# Patient Record
Sex: Female | Born: 1955
Health system: Southern US, Community
[De-identification: ages and names within clinical notes are randomized; demographics above are authoritative.]

## PROBLEM LIST (undated history)

## (undated) DIAGNOSIS — F419 Anxiety disorder, unspecified: Secondary | ICD-10-CM

## (undated) DIAGNOSIS — R51 Headache: Secondary | ICD-10-CM

## (undated) DIAGNOSIS — E039 Hypothyroidism, unspecified: Secondary | ICD-10-CM

## (undated) DIAGNOSIS — M1990 Unspecified osteoarthritis, unspecified site: Secondary | ICD-10-CM

## (undated) DIAGNOSIS — K754 Autoimmune hepatitis: Secondary | ICD-10-CM

## (undated) DIAGNOSIS — J45909 Unspecified asthma, uncomplicated: Secondary | ICD-10-CM

## (undated) DIAGNOSIS — J189 Pneumonia, unspecified organism: Secondary | ICD-10-CM

## (undated) HISTORY — PX: CARDIAC CATHETERIZATION: SHX172

## (undated) HISTORY — PX: ABDOMINAL HYSTERECTOMY: SHX81

## (undated) HISTORY — PX: KNEE ARTHROSCOPY W/ MENISCAL REPAIR: SHX1877

## (undated) HISTORY — PX: EYE SURGERY: SHX253

## (undated) HISTORY — PX: TUBAL LIGATION: SHX77

---

## 2001-06-14 ENCOUNTER — Encounter: Payer: Self-pay | Admitting: Internal Medicine

## 2001-06-14 ENCOUNTER — Ambulatory Visit (HOSPITAL_COMMUNITY): Admission: RE | Admit: 2001-06-14 | Discharge: 2001-06-14 | Payer: Self-pay | Admitting: Internal Medicine

## 2001-06-19 ENCOUNTER — Encounter: Payer: Self-pay | Admitting: Emergency Medicine

## 2001-06-19 ENCOUNTER — Emergency Department (HOSPITAL_COMMUNITY): Admission: EM | Admit: 2001-06-19 | Discharge: 2001-06-19 | Payer: Self-pay | Admitting: Emergency Medicine

## 2001-06-21 ENCOUNTER — Encounter: Payer: Self-pay | Admitting: Internal Medicine

## 2001-06-21 ENCOUNTER — Ambulatory Visit (HOSPITAL_COMMUNITY): Admission: RE | Admit: 2001-06-21 | Discharge: 2001-06-21 | Payer: Self-pay | Admitting: Internal Medicine

## 2002-03-19 ENCOUNTER — Ambulatory Visit (HOSPITAL_COMMUNITY): Admission: RE | Admit: 2002-03-19 | Discharge: 2002-03-19 | Payer: Self-pay | Admitting: Otolaryngology

## 2002-03-19 ENCOUNTER — Encounter: Payer: Self-pay | Admitting: Otolaryngology

## 2002-07-16 ENCOUNTER — Ambulatory Visit (HOSPITAL_COMMUNITY): Admission: RE | Admit: 2002-07-16 | Discharge: 2002-07-16 | Payer: Self-pay | Admitting: Family Medicine

## 2002-07-16 ENCOUNTER — Encounter: Payer: Self-pay | Admitting: Family Medicine

## 2003-01-09 ENCOUNTER — Ambulatory Visit (HOSPITAL_COMMUNITY): Admission: RE | Admit: 2003-01-09 | Discharge: 2003-01-09 | Payer: Self-pay | Admitting: Internal Medicine

## 2003-07-15 ENCOUNTER — Ambulatory Visit (HOSPITAL_COMMUNITY): Admission: RE | Admit: 2003-07-15 | Discharge: 2003-07-15 | Payer: Self-pay | Admitting: Internal Medicine

## 2003-07-21 ENCOUNTER — Ambulatory Visit (HOSPITAL_COMMUNITY): Admission: RE | Admit: 2003-07-21 | Discharge: 2003-07-21 | Payer: Self-pay | Admitting: Family Medicine

## 2003-10-23 ENCOUNTER — Encounter (HOSPITAL_COMMUNITY): Admission: RE | Admit: 2003-10-23 | Discharge: 2004-01-21 | Payer: Self-pay | Admitting: General Surgery

## 2003-12-07 ENCOUNTER — Ambulatory Visit (HOSPITAL_COMMUNITY): Admission: RE | Admit: 2003-12-07 | Discharge: 2003-12-07 | Payer: Self-pay | Admitting: Cardiovascular Disease

## 2003-12-10 ENCOUNTER — Ambulatory Visit (HOSPITAL_COMMUNITY): Admission: RE | Admit: 2003-12-10 | Discharge: 2003-12-10 | Payer: Self-pay | Admitting: Cardiovascular Disease

## 2004-03-11 ENCOUNTER — Encounter: Admission: RE | Admit: 2004-03-11 | Discharge: 2004-03-11 | Payer: Self-pay | Admitting: General Surgery

## 2004-08-26 ENCOUNTER — Ambulatory Visit (HOSPITAL_COMMUNITY): Admission: RE | Admit: 2004-08-26 | Discharge: 2004-08-26 | Payer: Self-pay | Admitting: Family Medicine

## 2004-09-01 ENCOUNTER — Ambulatory Visit (HOSPITAL_COMMUNITY): Admission: RE | Admit: 2004-09-01 | Discharge: 2004-09-01 | Payer: Self-pay | Admitting: Family Medicine

## 2005-05-09 ENCOUNTER — Ambulatory Visit (HOSPITAL_COMMUNITY): Admission: RE | Admit: 2005-05-09 | Discharge: 2005-05-09 | Payer: Self-pay | Admitting: Family Medicine

## 2006-01-04 ENCOUNTER — Encounter: Admission: RE | Admit: 2006-01-04 | Discharge: 2006-01-04 | Payer: Self-pay | Admitting: Family Medicine

## 2007-07-04 ENCOUNTER — Ambulatory Visit (HOSPITAL_COMMUNITY): Admission: RE | Admit: 2007-07-04 | Discharge: 2007-07-04 | Payer: Self-pay | Admitting: Internal Medicine

## 2008-06-24 ENCOUNTER — Ambulatory Visit (HOSPITAL_COMMUNITY): Admission: RE | Admit: 2008-06-24 | Discharge: 2008-06-24 | Payer: Self-pay | Admitting: Family Medicine

## 2009-06-10 ENCOUNTER — Emergency Department (HOSPITAL_COMMUNITY): Admission: EM | Admit: 2009-06-10 | Discharge: 2009-06-10 | Payer: Self-pay | Admitting: Emergency Medicine

## 2009-06-16 ENCOUNTER — Ambulatory Visit (HOSPITAL_COMMUNITY): Admission: RE | Admit: 2009-06-16 | Discharge: 2009-06-16 | Payer: Self-pay | Admitting: Family Medicine

## 2010-07-14 ENCOUNTER — Ambulatory Visit (HOSPITAL_COMMUNITY): Admission: RE | Admit: 2010-07-14 | Discharge: 2010-07-14 | Payer: Self-pay | Admitting: Family Medicine

## 2011-01-27 NOTE — Op Note (Signed)
NAME:  Kimberly Reyes, Kimberly Reyes                         ACCOUNT NO.:  [id number]   MEDICAL RECORD NO.:  [id number]                   PATIENT TYPE:  AMB   LOCATION:  DAY                                  FACILITY:  [facility]   PHYSICIAN:  Lionel December, M.D.                 DATE OF BIRTH:  23-Oct-1955   DATE OF PROCEDURE:  01/09/2003  DATE OF DISCHARGE:                                 OPERATIVE REPORT   PROCEDURE:  Total colonoscopy.   INDICATIONS FOR PROCEDURE:  Kimberly Reyes is a 55 year old Caucasian female who was  recently found to have heme positive stools. There is no history of melena  or rectal bleeding but she has chronic constipation and she has been able to  control it by watching her diet and taking a stool softener. Her family  history is positive for colon carcinoma in her father. The family history is  very significant for various malignancies on the father's side of the  family.   The procedure risks were viewed with the patient and informed consent was  obtained.   PREOPERATIVE MEDICATIONS:  Demerol 40 mg IV, Versed 6 mg IV in divided dose.   INSTRUMENT:  Olympus video system.   FINDINGS:  The procedure was performed in the endoscopy suite. The patient's  vital signs and O2 saturations were monitored during the procedure and  remained stable. The patient was placed in the left lateral decubitus  position, rectal examination performed. This was within normal limits. The  scope was placed per rectum and advanced under direct vision into the  sigmoid colon and beyond. Preparation was satisfactory.  The scope was passed in the cecum which was identified by the appendiceal  orifice and the ileocecal valve. Pictures taken for the record. As the scope  was withdrawn, the colonic mucosa was once again carefully examined and  there were no polyps, tumor mass or diverticular changes. The rectal mucosa  similarly was normal. The scope was retroflexed to examine the anorectal  junction which  was unremarkable.  The endoscope was straightened and  withdrawn. The patient tolerated the procedure well.   FINAL DIAGNOSIS:  Normal colonoscopy.   RECOMMENDATIONS:  High fiber diet, Citrucel or equivalent one tablespoonful  daily and Colace 1-2 tablets at bedtime.   I would like to have hemoccults repeated in three months and also get a CBC  check. If these are negative, she will not need further studies; however, if  stool is still guaiac positive may need upper GI with small bowel follow  through.                                                Lionel December, M.D.    NR/MEDQ  D:  01/09/2003  T:  01/09/2003  Job:  [id number]   cc:   Donata Duff  [address]  [place]  [place] [address]  Fax: [phone]   Corrie Mckusick, M.D.  [address] [place]  [place]  [place] [address]  Fax: [phone]

## 2011-01-27 NOTE — Cardiovascular Report (Signed)
NAME:  Kimberly Reyes, Kimberly Reyes                         ACCOUNT NO.:  [id number]   MEDICAL RECORD NO.:  [id number]                   PATIENT TYPE:  OIB   LOCATION:  2856                                 FACILITY:  [facility]   PHYSICIAN:  Nanetta Batty, M.D.                DATE OF BIRTH:  [birth year]   DATE OF PROCEDURE:  12/10/2003  DATE OF DISCHARGE:  12/10/2003                              CARDIAC CATHETERIZATION   INDICATIONS:  Kimberly Reyes is a 55 year old married white female who was  complaining of chest discomfort.  She has a minimally abnormal Cardiolite.  She is being evaluated for a breast mass.  She was referred by Dr. Domingo Sep  for diagnostic coronary arteriography to define her anatomy and risk  stratify her.   PROCEDURE DESCRIPTION:  The patient was brought to the second floor [facility] in the postabsorptive state.  She was  premedicated with IV Versed and morphine.  Her right groin was prepped and  shaved in the usual sterile fashion.  1% Xylocaine was used for local  anesthesia.  A 6 French sheath was inserted into the right femoral artery  using standard Seldinger technique. A 6 French right and left Judkins  diagnostic catheter as well as 6 French pigtail catheter were used for  selective coronary angiography, left ventriculography respectively.  Omnipaque dye was used for the entirety of the case. Retrograde aortic,  ventricular pullback pressures were recorded.   HEMODYNAMICS:  1. Aortic systolic pressure 140, diastolic pressure 85.  2. Left ventricular systolic pressure 130, end-diastolic pressure 10.   SELECTIVE CORONARY ANGIOGRAPHY:  1. Left main normal.  2. LAD:  Normal.  3. Left circumflex:  Normal.  4. Right coronary artery is dominant and normal.   LEFT VENTRICULOGRAPHY:  RAO left ventriculogram was performed using 25 mL of  Omnipaque dye at 12 mL per second.  The overall LVEF was estimated greater  than 60% with focal wall motion  abnormalities.   IMPRESSION:  Kimberly Reyes has essentially normal coronary arteries, normal  left ventricular function.  Her chest pain is noncardiac and her Cardiolite  was false-positive/artifactual.  The sheaths were removed.  Pressure was  held  on the groin to achieve hemostasis.  The patient left the lab in stable  condition.  She will be discharged home later today after remaining  recumbent for five hours and will see Dr. Delorise Jackson back in followup.  Dr. Dani Gobble, MD was notified of these results.  The patient left the lab in  stable condition.                                               Nanetta Batty, M.D.    Cordelia Pen  D:  12/10/2003  T:  12/11/2003  Job:  [id number]   cc:   [facility]  [address]  [place], [place] [address]   Dani Gobble, MD  Fax: [phone]   Corrie Mckusick, M.D.  [address] [place]  [place]  [place] [address]  Fax: [phone]

## 2012-10-10 ENCOUNTER — Encounter (INDEPENDENT_AMBULATORY_CARE_PROVIDER_SITE_OTHER): Payer: Self-pay | Admitting: *Deleted

## 2012-10-10 ENCOUNTER — Encounter (INDEPENDENT_AMBULATORY_CARE_PROVIDER_SITE_OTHER): Payer: Self-pay

## 2012-12-17 ENCOUNTER — Encounter (INDEPENDENT_AMBULATORY_CARE_PROVIDER_SITE_OTHER): Payer: Self-pay | Admitting: *Deleted

## 2013-02-11 ENCOUNTER — Other Ambulatory Visit (INDEPENDENT_AMBULATORY_CARE_PROVIDER_SITE_OTHER): Payer: Self-pay | Admitting: *Deleted

## 2013-02-11 ENCOUNTER — Encounter (INDEPENDENT_AMBULATORY_CARE_PROVIDER_SITE_OTHER): Payer: Self-pay | Admitting: *Deleted

## 2013-02-11 ENCOUNTER — Telehealth (INDEPENDENT_AMBULATORY_CARE_PROVIDER_SITE_OTHER): Payer: Self-pay | Admitting: *Deleted

## 2013-02-11 DIAGNOSIS — Z800 Family history of malignant neoplasm of digestive organs: Secondary | ICD-10-CM

## 2013-02-11 DIAGNOSIS — Z1211 Encounter for screening for malignant neoplasm of colon: Secondary | ICD-10-CM

## 2013-02-11 NOTE — Telephone Encounter (Signed)
Patient needs movi prep 

## 2013-02-12 MED ORDER — PEG-KCL-NACL-NASULF-NA ASC-C 100 G PO SOLR: 1.0000 | Freq: Once | ORAL | Status: DC

## 2013-02-25 ENCOUNTER — Telehealth (INDEPENDENT_AMBULATORY_CARE_PROVIDER_SITE_OTHER): Payer: Self-pay | Admitting: *Deleted

## 2013-02-25 NOTE — Telephone Encounter (Signed)
agree

## 2013-02-25 NOTE — Telephone Encounter (Signed)
  Procedure: tcs  Reason/Indication:  fam hx colon ca  Has patient had this procedure before?  2009 (scanned)  If so, when, by whom and where?    Is there a family history of colon cancer?  Yes, father & sister  Who?  What age when diagnosed?    Is patient diabetic?   no      Does patient have prosthetic heart valve?  no  Do you have a pacemaker?  no  Has patient ever had endocarditis? no  Has patient had joint replacement within last 12 months?  no  Is patient on Coumadin, Plavix and/or Aspirin? no  Medications: clobetasol propionate liquid prn, cyclobenzapr flexural 10 mg tid, wellbutrin 300 mg daily, levothyroxine 50 mg daily, premarin 6.25 mg daily, vitamins  Allergies: nkda  Medication Adjustment:   Procedure date & time: 03/27/13 at 1030

## 2013-03-12 ENCOUNTER — Encounter (HOSPITAL_COMMUNITY): Payer: Self-pay

## 2013-03-27 ENCOUNTER — Encounter (HOSPITAL_COMMUNITY): Payer: Self-pay

## 2013-03-27 ENCOUNTER — Encounter (HOSPITAL_COMMUNITY)
Admission: RE | Disposition: A | Discharge status: Home / Self Care | Payer: Self-pay | Source: Ambulatory Visit | Attending: Internal Medicine

## 2013-03-27 ENCOUNTER — Ambulatory Visit (HOSPITAL_COMMUNITY)
Admission: RE | Admit: 2013-03-27 | Discharge: 2013-03-27 | Disposition: A | Discharge status: Home / Self Care | Payer: 59 | Source: Ambulatory Visit | Attending: Internal Medicine | Admitting: Internal Medicine

## 2013-03-27 DIAGNOSIS — F411 Generalized anxiety disorder: Secondary | ICD-10-CM | POA: Insufficient documentation

## 2013-03-27 DIAGNOSIS — Z1211 Encounter for screening for malignant neoplasm of colon: Secondary | ICD-10-CM | POA: Insufficient documentation

## 2013-03-27 DIAGNOSIS — Z800 Family history of malignant neoplasm of digestive organs: Secondary | ICD-10-CM

## 2013-03-27 DIAGNOSIS — Z90710 Acquired absence of both cervix and uterus: Secondary | ICD-10-CM | POA: Insufficient documentation

## 2013-03-27 DIAGNOSIS — Z79899 Other long term (current) drug therapy: Secondary | ICD-10-CM | POA: Insufficient documentation

## 2013-03-27 DIAGNOSIS — E0590 Thyrotoxicosis, unspecified without thyrotoxic crisis or storm: Secondary | ICD-10-CM | POA: Insufficient documentation

## 2013-03-27 DIAGNOSIS — K5900 Constipation, unspecified: Secondary | ICD-10-CM | POA: Insufficient documentation

## 2013-03-27 HISTORY — DX: Headache: R51

## 2013-03-27 HISTORY — PX: COLONOSCOPY: SHX5424

## 2013-03-27 HISTORY — DX: Anxiety disorder, unspecified: F41.9

## 2013-03-27 SURGERY — COLONOSCOPY
Anesthesia: Moderate Sedation

## 2013-03-27 MED ORDER — SODIUM CHLORIDE 0.9 % IV SOLN
INTRAVENOUS | Status: DC
  Administered 2013-03-27: 10:00:00 via INTRAVENOUS

## 2013-03-27 MED ORDER — MIDAZOLAM HCL 5 MG/5ML IJ SOLN
INTRAMUSCULAR | Status: DC | PRN
  Administered 2013-03-27: 1 mg via INTRAVENOUS
  Administered 2013-03-27 (×3): 2 mg via INTRAVENOUS

## 2013-03-27 MED ORDER — MEPERIDINE HCL 50 MG/ML IJ SOLN
INTRAMUSCULAR | Status: DC | PRN
  Administered 2013-03-27 (×2): 25 mg via INTRAVENOUS

## 2013-03-27 MED ORDER — MIDAZOLAM HCL 5 MG/5ML IJ SOLN
INTRAMUSCULAR | Status: AC
  Filled 2013-03-27: qty 10

## 2013-03-27 MED ORDER — MEPERIDINE HCL 50 MG/ML IJ SOLN
INTRAMUSCULAR | Status: AC
  Filled 2013-03-27: qty 1

## 2013-03-27 MED ORDER — STERILE WATER FOR IRRIGATION IR SOLN
Status: DC | PRN
  Administered 2013-03-27: 11:00:00

## 2013-03-27 NOTE — Op Note (Signed)
COLONOSCOPY PROCEDURE REPORT  PATIENT:  Kimberly Reyes  MR#:  [id number] Birthdate:  Feb 26, 1956, 57 y.o., female Endoscopist:  Dr. Malissa Hippo, MD Referred By:  Dr. Corrie Mckusick, MD Procedure Date: 03/27/2013  Procedure:   Colonoscopy  Indications: Patient is a 57 year old Caucasian female who is undergoing high risk screening colonoscopy. Family history significant for colon carcinoma in her father at age 71. He lived to be 84.  Informed Consent:  The procedure and risks were reviewed with the patient and informed consent was obtained.  Medications:  Demerol 50 mg IV Versed 7 mg IV  Description of procedure:  After a digital rectal exam was performed, that colonoscope was advanced from the anus through the rectum and colon to the area of the cecum, ileocecal valve and appendiceal orifice. The cecum was deeply intubated. These structures were well-seen and photographed for the record. From the level of the cecum and ileocecal valve, the scope was slowly and cautiously withdrawn. The mucosal surfaces were carefully surveyed utilizing scope tip to flexion to facilitate fold flattening as needed. The scope was pulled down into the rectum where a thorough exam including retroflexion was performed. Terminal ileum was also examined.  Findings:   Prep satisfactory. Normal mucosa of terminal. Normal mucosa of the various segments of colon. Normal rectal mucosa and anal rectal junction.   Therapeutic/Diagnostic Maneuvers Performed:  None  Complications:  None  Cecal Withdrawal Time:  7 minutes  Impression:  Normal terminal ileum. Normal colonoscopy.  Recommendations:  Standard instructions given. Next screening exam in 5 years.  REHMAN,NAJEEB U  03/27/2013 11:18 AM  CC: Dr. Phillips Odor, Chancy Hurter, MD & Dr. Bonnetta Barry ref. provider found

## 2013-03-27 NOTE — H&P (Signed)
Kimberly Reyes is an 57 y.o. female.   Chief Complaint: Patient is here for colonoscopy. HPI: Patient is 57 year old Caucasian female who is here for screening colonoscopy. She denies abdominal pain or rectal bleeding. She has chronic constipation. Her last exam was in January 2009. Family history significant for CRC and father who was 1 at the time of diagnosis and died at 6 of unrelated causes.  Past Medical History  Diagnosis Date  . Hyperthyroidism   . Anxiety   . MVHQIONG(295.2)     Past Surgical History  Procedure Laterality Date  . Abdominal hysterectomy      20 years ago    Family History  Problem Relation Age of Onset  . Colon cancer Father    Social History:  reports that she has never smoked. She does not have any smokeless tobacco history on file. She reports that  drinks alcohol. She reports that she does not use illicit drugs.  Allergies: No Known Allergies  Medications Prior to Admission  Medication Sig Dispense Refill  . BIOTIN PO Take 1 tablet by mouth daily.      Marland Kitchen buPROPion (WELLBUTRIN XL) 300 MG 24 hr tablet Take 300 mg by mouth daily.      . Calcium Carbonate-Vit D-Min (CALCIUM 1200 PO) Take 1 tablet by mouth daily.      . CHROMIUM PICOLINATE PO Take 1 tablet by mouth daily.      . Cyanocobalamin (VITAMIN B 12 PO) Take 1 tablet by mouth daily.      . cyclobenzaprine (FLEXERIL) 10 MG tablet Take 10 mg by mouth 3 (three) times daily as needed for muscle spasms.      Marland Kitchen eletriptan (RELPAX) 40 MG tablet Take 40 mg by mouth as needed for migraine. One tablet by mouth at onset of headache. May repeat in 2 hours if headache persists or recurs.      Marland Kitchen estrogens, conjugated, (PREMARIN) 0.625 MG tablet Take 0.625 mg by mouth daily. Take daily for 21 days then do not take for 7 days.      Marland Kitchen levothyroxine (SYNTHROID, LEVOTHROID) 50 MCG tablet Take 50 mcg by mouth daily before breakfast.      . MAGNESIUM PO Take 1 tablet by mouth daily.      . Multiple  Vitamins-Minerals (MULTIVITAMINS THER. W/MINERALS) TABS Take 1 tablet by mouth daily.      . Omega-3 Fatty Acids (FISH OIL) 1200 MG CAPS Take 1 capsule by mouth daily.      . peg 3350 powder (MOVIPREP) 100 G SOLR Take 1 kit (100 g total) by mouth once.  1 kit  0    No results found for this or any previous visit (from the past 48 hour(s)). No results found.  ROS  Blood pressure 140/82, pulse 93, temperature 97.5 F (36.4 C), temperature source Oral, resp. rate 22, height 5' 8.5" (1.74 m), weight 158 lb (71.668 kg), SpO2 100.00%. Physical Exam  Constitutional: She appears well-developed and well-nourished.  HENT:  Mouth/Throat: Oropharynx is clear and moist.  Eyes: Conjunctivae are normal.  Neck: Neck supple. No thyromegaly present.  Cardiovascular: Normal rate, regular rhythm and normal heart sounds.   No murmur heard. Respiratory: Effort normal and breath sounds normal.  GI: Soft. She exhibits no distension and no mass. There is no tenderness.  Musculoskeletal: She exhibits no edema.  Lymphadenopathy:    She has no cervical adenopathy.  Neurological: She is alert.  Skin: Skin is warm and dry.  Assessment/Plan High risk screening colonoscopy.  REHMAN,NAJEEB U 03/27/2013, 10:47 AM

## 2013-03-28 ENCOUNTER — Encounter (HOSPITAL_COMMUNITY): Payer: Self-pay | Admitting: Internal Medicine

## 2013-06-10 ENCOUNTER — Ambulatory Visit (HOSPITAL_COMMUNITY): Payer: 59 | Admitting: Physical Therapy

## 2014-10-19 ENCOUNTER — Other Ambulatory Visit (HOSPITAL_COMMUNITY): Payer: Self-pay | Admitting: Family Medicine

## 2014-10-19 DIAGNOSIS — K5909 Other constipation: Secondary | ICD-10-CM

## 2014-10-19 DIAGNOSIS — R109 Unspecified abdominal pain: Secondary | ICD-10-CM

## 2014-10-23 ENCOUNTER — Ambulatory Visit (HOSPITAL_COMMUNITY)
Admission: RE | Admit: 2014-10-23 | Discharge: 2014-10-23 | Disposition: A | Discharge status: Home / Self Care | Payer: 59 | Source: Ambulatory Visit | Attending: Family Medicine | Admitting: Family Medicine

## 2014-10-23 ENCOUNTER — Emergency Department (HOSPITAL_COMMUNITY)
Admission: EM | Admit: 2014-10-23 | Discharge: 2014-10-24 | Disposition: A | Discharge status: Home / Self Care | Payer: 59 | Attending: Emergency Medicine | Admitting: Emergency Medicine

## 2014-10-23 ENCOUNTER — Encounter (HOSPITAL_COMMUNITY): Payer: Self-pay | Admitting: Emergency Medicine

## 2014-10-23 DIAGNOSIS — K8020 Calculus of gallbladder without cholecystitis without obstruction: Secondary | ICD-10-CM | POA: Diagnosis not present

## 2014-10-23 DIAGNOSIS — R109 Unspecified abdominal pain: Secondary | ICD-10-CM

## 2014-10-23 DIAGNOSIS — E039 Hypothyroidism, unspecified: Secondary | ICD-10-CM | POA: Diagnosis not present

## 2014-10-23 DIAGNOSIS — K8050 Calculus of bile duct without cholangitis or cholecystitis without obstruction: Secondary | ICD-10-CM

## 2014-10-23 DIAGNOSIS — Z9889 Other specified postprocedural states: Secondary | ICD-10-CM | POA: Diagnosis not present

## 2014-10-23 DIAGNOSIS — Z90710 Acquired absence of both cervix and uterus: Secondary | ICD-10-CM | POA: Insufficient documentation

## 2014-10-23 DIAGNOSIS — Z8659 Personal history of other mental and behavioral disorders: Secondary | ICD-10-CM | POA: Insufficient documentation

## 2014-10-23 DIAGNOSIS — R1110 Vomiting, unspecified: Secondary | ICD-10-CM | POA: Diagnosis present

## 2014-10-23 DIAGNOSIS — Z7982 Long term (current) use of aspirin: Secondary | ICD-10-CM | POA: Diagnosis not present

## 2014-10-23 DIAGNOSIS — K5909 Other constipation: Secondary | ICD-10-CM

## 2014-10-23 LAB — CBC WITH DIFFERENTIAL/PLATELET
BASOS ABS: 0 10*3/uL (ref 0.0–0.1)
BASOS PCT: 0 % (ref 0–1)
Eosinophils Absolute: 0.2 10*3/uL (ref 0.0–0.7)
Eosinophils Relative: 2 % (ref 0–5)
HCT: 38.5 % (ref 36.0–46.0)
Hemoglobin: 12.7 g/dL (ref 12.0–15.0)
LYMPHS PCT: 5 % — AB (ref 12–46)
Lymphs Abs: 0.8 10*3/uL (ref 0.7–4.0)
MCH: 30.6 pg (ref 26.0–34.0)
MCHC: 33 g/dL (ref 30.0–36.0)
MCV: 92.8 fL (ref 78.0–100.0)
MONO ABS: 1 10*3/uL (ref 0.1–1.0)
Monocytes Relative: 6 % (ref 3–12)
NEUTROS ABS: 13.8 10*3/uL — AB (ref 1.7–7.7)
Neutrophils Relative %: 87 % — ABNORMAL HIGH (ref 43–77)
PLATELETS: 246 10*3/uL (ref 150–400)
RBC: 4.15 MIL/uL (ref 3.87–5.11)
RDW: 12.3 % (ref 11.5–15.5)
WBC: 15.9 10*3/uL — AB (ref 4.0–10.5)

## 2014-10-23 MED ORDER — SODIUM CHLORIDE 0.9 % IV SOLN
1000.0000 mL | INTRAVENOUS | Status: DC
Start: 2014-10-23 — End: 2014-10-24

## 2014-10-23 MED ORDER — SODIUM CHLORIDE 0.9 % IV SOLN
1000.0000 mL | Freq: Once | INTRAVENOUS | Status: AC
  Administered 2014-10-24: 1000 mL via INTRAVENOUS

## 2014-10-23 MED ORDER — SODIUM CHLORIDE 0.9 % IV SOLN
1000.0000 mL | Freq: Once | INTRAVENOUS | Status: AC
  Administered 2014-10-23: 1000 mL via INTRAVENOUS

## 2014-10-23 MED ORDER — FENTANYL CITRATE 0.05 MG/ML IJ SOLN
50.0000 ug | Freq: Once | INTRAMUSCULAR | Status: AC
  Administered 2014-10-23: 50 ug via INTRAVENOUS
  Filled 2014-10-23: qty 2

## 2014-10-23 MED ORDER — ONDANSETRON HCL 4 MG/2ML IJ SOLN
4.0000 mg | Freq: Once | INTRAMUSCULAR | Status: AC
  Administered 2014-10-23: 4 mg via INTRAVENOUS
  Filled 2014-10-23: qty 2

## 2014-10-23 NOTE — ED Notes (Signed)
Onset tonight vomiting and abdominal pain

## 2014-10-23 NOTE — ED Provider Notes (Signed)
CSN: [id number]     Arrival date & time 10/23/14  2234 History   This chart was scribed for Janice Norrie, MD by Randa Evens, ED Scribe. This patient was seen in room [facility] and the patient's care was started at 11:12 PM.     Chief Complaint  Patient presents with  . Emesis   Patient is a 59 y.o. female presenting with vomiting. The history is provided by the patient. No language interpreter was used.  Emesis Associated symptoms: abdominal pain   Associated symptoms: no diarrhea    HPI Comments: Kimberly Reyes is a 59 y.o. female who presents to the Emergency Department complaining of vomiting x7 onset today about 3 hours PTA. Pt states she has associated nausea, left upper quadrant and epigastric abdominal pain and ?  fever. Pt states that her pain began right after eating [place] stew and grilled cheese sandwich tonight.  Pt states that she had an ultra sound today due to having problems with abdominal pain and diarrhea for the past 3 weeks. Pt states that she was told that she would have to have surgery after getting the reading of her ultra sound today which showed gallstones. She is waiting for a referral by her PCP. Pt denies any diarrhea today, but had diarrhea last week.   PCP Dr Hilma Favors   Past Medical History  Diagnosis Date  . Hyperthyroidism   . Anxiety   . PJASNKNL(976.7)    Past Surgical History  Procedure Laterality Date  . Abdominal hysterectomy      20 years ago  . Colonoscopy N/A 03/27/2013    Procedure: COLONOSCOPY;  Surgeon: Rogene Houston, MD;  Location: [facility];  Service: Endoscopy;  Laterality: N/A;  1030   Family History  Problem Relation Age of Onset  . Colon cancer Father    History  Substance Use Topics  . Smoking status: Never Smoker   . Smokeless tobacco: Not on file  . Alcohol Use: Yes   Employed Lives with spouse  OB History    No data available     Review of Systems  Constitutional: Positive for fever.   Gastrointestinal: Positive for nausea, vomiting and abdominal pain. Negative for diarrhea.  All other systems reviewed and are negative.    Allergies  Review of patient's allergies indicates no known allergies.  Home Medications   Prior to Admission medications   Medication Sig Start Date End Date Taking? Authorizing Provider  aspirin EC 81 MG tablet Take 81 mg by mouth daily.   Yes Historical Provider, MD  BIOTIN PO Take 1 tablet by mouth daily.   Yes Historical Provider, MD  Calcium Carbonate-Vit D-Min (CALCIUM 1200 PO) Take 1 tablet by mouth daily.   Yes Historical Provider, MD  CHROMIUM PICOLINATE PO Take 1 tablet by mouth daily.   Yes Historical Provider, MD  Cyanocobalamin (VITAMIN B 12 PO) Take 1 tablet by mouth daily.   Yes Historical Provider, MD  cyclobenzaprine (FLEXERIL) 10 MG tablet Take 10 mg by mouth 3 (three) times daily as needed for muscle spasms.   Yes Historical Provider, MD  eletriptan (RELPAX) 40 MG tablet Take 40 mg by mouth as needed for migraine. One tablet by mouth at onset of headache. May repeat in 2 hours if headache persists or recurs.   Yes Historical Provider, MD  levothyroxine (SYNTHROID, LEVOTHROID) 50 MCG tablet Take 50 mcg by mouth daily before breakfast.   Yes Historical Provider, MD  MAGNESIUM PO Take 1 tablet  by mouth daily.   Yes Historical Provider, MD  Multiple Vitamins-Minerals (MULTIVITAMINS THER. W/MINERALS) TABS Take 1 tablet by mouth daily.   Yes Historical Provider, MD  Omega-3 Fatty Acids (FISH OIL) 1200 MG CAPS Take 1 capsule by mouth daily.   Yes Historical Provider, MD   BP 153/85 mmHg  Pulse 112  Temp(Src) 98.5 F (36.9 C) (Oral)  Resp 20  Ht 5\' 9"  (1.753 m)  Wt 160 lb (72.576 kg)  BMI 23.62 kg/m2  SpO2 100%  Vital signs normal except tachycardia    Physical Exam  Constitutional: She is oriented to person, place, and time. She appears well-developed and well-nourished. She has a sickly appearance. She does not appear ill.  She appears distressed.  HENT:  Head: Normocephalic and atraumatic.  Right Ear: External ear normal.  Left Ear: External ear normal.  Nose: Nose normal. No mucosal edema or rhinorrhea.  Mouth/Throat: Mucous membranes are normal. No dental abscesses or uvula swelling.  Dry mucous membranes.   Eyes: Conjunctivae and EOM are normal. Pupils are equal, round, and reactive to light.  Neck: Normal range of motion and full passive range of motion without pain. Neck supple.  Cardiovascular: Normal rate, regular rhythm and normal heart sounds.  Exam reveals no gallop and no friction rub.   No murmur heard. Pulmonary/Chest: Effort normal and breath sounds normal. No respiratory distress. She has no wheezes. She has no rhonchi. She has no rales. She exhibits no tenderness and no crepitus.  Abdominal: Soft. Normal appearance and bowel sounds are normal. She exhibits no distension. There is tenderness in the epigastric area and left upper quadrant. There is no rebound and no guarding.  Musculoskeletal: Normal range of motion. She exhibits no edema or tenderness.  Moves all extremities well.   Neurological: She is alert and oriented to person, place, and time. She has normal strength. No cranial nerve deficit.  Skin: Skin is warm, dry and intact. No rash noted. No erythema. No pallor.  Psychiatric: She has a normal mood and affect. Her speech is normal and behavior is normal. Her mood appears not anxious.  Nursing note and vitals reviewed.   ED Course  Procedures (including critical care time)  Medications  0.9 %  sodium chloride infusion (0 mLs Intravenous Stopped 10/24/14 0006)    Followed by  0.9 %  sodium chloride infusion (1,000 mLs Intravenous New Bag/Given 10/24/14 0006)    Followed by  0.9 %  sodium chloride infusion (not administered)  ondansetron (ZOFRAN) injection 4 mg (4 mg Intravenous Given 10/23/14 2334)  fentaNYL (SUBLIMAZE) injection 50 mcg (50 mcg Intravenous Given 10/23/14 2337)   fentaNYL (SUBLIMAZE) injection 25 mcg (25 mcg Intravenous Given 10/24/14 0105)    DIAGNOSTIC STUDIES: Oxygen Saturation is 100% on RA, normal by my interpretation.    COORDINATION OF CARE: 11:17 PM-Discussed treatment plan with pt at bedside and pt agreed to plan.   01:45 pt given her test results. Still having pain, now about a "3". Nausea however is gone. Has need to have urine output, tongue still slightly dry. Finishing her 2nd liter of IV fluids.  Discussed more pain medications and if pain resolved will be able to be discharged.  01:55 patient states she only has brief episodes of discomfort that come and go. Feeling better. Eating ice chips, feels ready to go home.   Labs Review  Results for orders placed or performed during the hospital encounter of 10/23/14  CBC WITH DIFFERENTIAL  Result Value Ref Range  WBC 15.9 (H) 4.0 - 10.5 K/uL   RBC 4.15 3.87 - 5.11 MIL/uL   Hemoglobin 12.7 12.0 - 15.0 g/dL   HCT 38.5 36.0 - 46.0 %   MCV 92.8 78.0 - 100.0 fL   MCH 30.6 26.0 - 34.0 pg   MCHC 33.0 30.0 - 36.0 g/dL   RDW 12.3 11.5 - 15.5 %   Platelets 246 150 - 400 K/uL   Neutrophils Relative % 87 (H) 43 - 77 %   Neutro Abs 13.8 (H) 1.7 - 7.7 K/uL   Lymphocytes Relative 5 (L) 12 - 46 %   Lymphs Abs 0.8 0.7 - 4.0 K/uL   Monocytes Relative 6 3 - 12 %   Monocytes Absolute 1.0 0.1 - 1.0 K/uL   Eosinophils Relative 2 0 - 5 %   Eosinophils Absolute 0.2 0.0 - 0.7 K/uL   Basophils Relative 0 0 - 1 %   Basophils Absolute 0.0 0.0 - 0.1 K/uL  Comprehensive metabolic panel  Result Value Ref Range   Sodium 136 135 - 145 mmol/L   Potassium 3.8 3.5 - 5.1 mmol/L   Chloride 104 96 - 112 mmol/L   CO2 26 19 - 32 mmol/L   Glucose, Bld 128 (H) 70 - 99 mg/dL   BUN 17 6 - 23 mg/dL   Creatinine, Ser 0.89 0.50 - 1.10 mg/dL   Calcium 8.9 8.4 - 10.5 mg/dL   Total Protein 7.2 6.0 - 8.3 g/dL   Albumin 4.2 3.5 - 5.2 g/dL   AST 39 (H) 0 - 37 U/L   ALT 31 0 - 35 U/L   Alkaline Phosphatase 97 39 - 117  U/L   Total Bilirubin 0.5 0.3 - 1.2 mg/dL   GFR calc non Af Amer 69 (L) >90 mL/min   GFR calc Af Amer 80 (L) >90 mL/min   Anion gap 6 5 - 15  Urinalysis with microscopic  Result Value Ref Range   Color, Urine YELLOW YELLOW   APPearance CLEAR CLEAR   Specific Gravity, Urine <1.005 (L) 1.005 - 1.030   pH 6.0 5.0 - 8.0   Glucose, UA NEGATIVE NEGATIVE mg/dL   Hgb urine dipstick NEGATIVE NEGATIVE   Bilirubin Urine NEGATIVE NEGATIVE   Ketones, ur TRACE (A) NEGATIVE mg/dL   Protein, ur NEGATIVE NEGATIVE mg/dL   Urobilinogen, UA 0.2 0.0 - 1.0 mg/dL   Nitrite NEGATIVE NEGATIVE   Leukocytes, UA NEGATIVE NEGATIVE  Lipase, blood  Result Value Ref Range   Lipase 27 11 - 59 U/L      Imaging Review US Abdomen Complete  10/23/2014   CLINICAL DATA:  Abdominal cramping.  EXAM: ULTRASOUND ABDOMEN COMPLETE  COMPARISON:  07/14/2010.  FINDINGS: Gallbladder: Gallstones. Gallbladder wall thickness 1.8 mm. Negative Murphy sign.  Common bile duct: Diameter: 5.7 mm  Liver: No focal lesion identified. Within normal limits in parenchymal echogenicity.  IVC: No abnormality visualized.  Pancreas: Visualized portion unremarkable.  Spleen: Size and appearance within normal limits.  Right Kidney: Length: 10.1 cm. Echogenicity within normal limits. No mass or hydronephrosis visualized.  Left Kidney: Length: 11.1 cm. Echogenicity within normal limits. No mass or hydronephrosis visualized.  Abdominal aorta: No aneurysm visualized.  Other findings: None.  IMPRESSION: Gallstones.  Exam is otherwise unremarkable.   Electronically Signed   By: Marcello Moores  Register   On: 10/23/2014 10:04     EKG Interpretation None      MDM   Final diagnoses:  Gallstones  Biliary colic   New Prescriptions   HYDROCODONE-ACETAMINOPHEN (  NORCO) 5-325 MG PER TABLET    Take 1-2 tablets by mouth every 6 (six) hours as needed for moderate pain or severe pain.   ONDANSETRON (ZOFRAN ODT) 4 MG DISINTEGRATING TABLET    Take 1 tablet (4 mg total)  by mouth every 8 (eight) hours as needed for nausea or vomiting.    Plan discharge  Rolland Porter, MD, FACEP    I personally performed the services described in this documentation, which was scribed in my presence. The recorded information has been reviewed and considered.  Rolland Porter, MD, Abram Sander      Janice Norrie, MD 10/24/14 [phone]

## 2014-10-24 LAB — COMPREHENSIVE METABOLIC PANEL
ALK PHOS: 97 U/L (ref 39–117)
ALT: 31 U/L (ref 0–35)
ANION GAP: 6 (ref 5–15)
AST: 39 U/L — AB (ref 0–37)
Albumin: 4.2 g/dL (ref 3.5–5.2)
BUN: 17 mg/dL (ref 6–23)
CHLORIDE: 104 mmol/L (ref 96–112)
CO2: 26 mmol/L (ref 19–32)
Calcium: 8.9 mg/dL (ref 8.4–10.5)
Creatinine, Ser: 0.89 mg/dL (ref 0.50–1.10)
GFR calc Af Amer: 80 mL/min — ABNORMAL LOW (ref >90)
GFR, EST NON AFRICAN AMERICAN: 69 mL/min — AB (ref >90)
GLUCOSE: 128 mg/dL — AB (ref 70–99)
Potassium: 3.8 mmol/L (ref 3.5–5.1)
Sodium: 136 mmol/L (ref 135–145)
Total Bilirubin: 0.5 mg/dL (ref 0.3–1.2)
Total Protein: 7.2 g/dL (ref 6.0–8.3)

## 2014-10-24 LAB — URINALYSIS, ROUTINE W REFLEX MICROSCOPIC
Bilirubin Urine: NEGATIVE
Glucose, UA: NEGATIVE mg/dL
Hgb urine dipstick: NEGATIVE
LEUKOCYTES UA: NEGATIVE
NITRITE: NEGATIVE
PH: 6 (ref 5.0–8.0)
PROTEIN: NEGATIVE mg/dL
Specific Gravity, Urine: <1.005 — ABNORMAL LOW (ref 1.005–1.030)
UROBILINOGEN UA: 0.2 mg/dL (ref 0.0–1.0)

## 2014-10-24 LAB — LIPASE, BLOOD: Lipase: 27 U/L (ref 11–59)

## 2014-10-24 MED ORDER — FENTANYL CITRATE 0.05 MG/ML IJ SOLN
25.0000 ug | Freq: Once | INTRAMUSCULAR | Status: AC
  Administered 2014-10-24: 25 ug via INTRAVENOUS
  Filled 2014-10-24: qty 2

## 2014-10-24 MED ORDER — ONDANSETRON 4 MG PO TBDP: 4.0000 mg | ORAL_TABLET | Freq: Three times a day (TID) | ORAL | Status: DC | PRN

## 2014-10-24 MED ORDER — HYDROCODONE-ACETAMINOPHEN 5-325 MG PO TABS: 1.0000 | ORAL_TABLET | Freq: Four times a day (QID) | ORAL | Status: DC | PRN

## 2014-10-24 NOTE — Discharge Instructions (Signed)
Avoid fried, spicy or greasy foods. If you get pain or nausea again use the zofran and hydrocodone. Return to the ED if you get a fever, or have uncontrolled vomiting or pain. Follow up with a surgeon to discuss having your gall bladder removed.      Biliary Colic  Biliary colic is a steady or irregular pain in the upper abdomen. It is usually under the right side of the rib cage. It happens when gallstones interfere with the normal flow of bile from the gallbladder. Bile is a liquid that helps to digest fats. Bile is made in the liver and stored in the gallbladder. When you eat a meal, bile passes from the gallbladder through the cystic duct and the common bile duct into the small intestine. There, it mixes with partially digested food. If a gallstone blocks either of these ducts, the normal flow of bile is blocked. The muscle cells in the bile duct contract forcefully to try to move the stone. This causes the pain of biliary colic.  SYMPTOMS   A person with biliary colic usually complains of pain in the upper abdomen. This pain can be:  In the center of the upper abdomen just below the breastbone.  In the upper-right part of the abdomen, near the gallbladder and liver.  Spread back toward the right shoulder blade.  Nausea and vomiting.  The pain usually occurs after eating.  Biliary colic is usually triggered by the digestive system's demand for bile. The demand for bile is high after fatty meals. Symptoms can also occur when a person who has been fasting suddenly eats a very large meal. Most episodes of biliary colic pass after 1 to 5 hours. After the most intense pain passes, your abdomen may continue to ache mildly for about 24 hours. DIAGNOSIS  After you describe your symptoms, your caregiver will perform a physical exam. He or she will pay attention to the upper right portion of your belly (abdomen). This is the area of your liver and gallbladder. An ultrasound will help your caregiver  look for gallstones. Specialized scans of the gallbladder may also be done. Blood tests may be done, especially if you have fever or if your pain persists. PREVENTION  Biliary colic can be prevented by controlling the risk factors for gallstones. Some of these risk factors, such as heredity, increasing age, and pregnancy are a normal part of life. Obesity and a high-fat diet are risk factors you can change through a healthy lifestyle. Women going through menopause who take hormone replacement therapy (estrogen) are also more likely to develop biliary colic. TREATMENT   Pain medication may be prescribed.  You may be encouraged to eat a fat-free diet.  If the first episode of biliary colic is severe, or episodes of colic keep retuning, surgery to remove the gallbladder (cholecystectomy) is usually recommended. This procedure can be done through small incisions using an instrument called a laparoscope. The procedure often requires a brief stay in the hospital. Some people can leave the hospital the same day. It is the most widely used treatment in people troubled by painful gallstones. It is effective and safe, with no complications in more than 90% of cases.  If surgery cannot be done, medication that dissolves gallstones may be used. This medication is expensive and can take months or years to work. Only small stones will dissolve.  Rarely, medication to dissolve gallstones is combined with a procedure called shock-wave lithotripsy. This procedure uses carefully aimed shock waves  to break up gallstones. In many people treated with this procedure, gallstones form again within a few years. PROGNOSIS  If gallstones block your cystic duct or common bile duct, you are at risk for repeated episodes of biliary colic. There is also a 25% chance that you will develop a gallbladder infection(acute cholecystitis), or some other complication of gallstones within 10 to 20 years. If you have surgery, schedule it at  a time that is convenient for you and at a time when you are not sick. HOME CARE INSTRUCTIONS   Drink plenty of clear fluids.  Avoid fatty, greasy or fried foods, or any foods that make your pain worse.  Take medications as directed. SEEK MEDICAL CARE IF:   You develop a fever over 100.5 F (38.1 C).  Your pain gets worse over time.  You develop nausea that prevents you from eating and drinking.  You develop vomiting. SEEK IMMEDIATE MEDICAL CARE IF:   You have continuous or severe belly (abdominal) pain which is not relieved with medications.  You develop nausea and vomiting which is not relieved with medications.  You have symptoms of biliary colic and you suddenly develop a fever and shaking chills. This may signal cholecystitis. Call your caregiver immediately.  You develop a yellow color to your skin or the white part of your eyes (jaundice). Document Released: 01/29/2006 Document Revised: 11/20/2011 Document Reviewed: 04/09/2008 [facility] Patient Information 2015 [place], [place]. This information is not intended to replace advice given to you by your health care provider. Make sure you discuss any questions you have with your health care provider.

## 2014-10-29 ENCOUNTER — Ambulatory Visit (INDEPENDENT_AMBULATORY_CARE_PROVIDER_SITE_OTHER): Payer: Self-pay | Admitting: General Surgery

## 2014-10-29 NOTE — H&P (Signed)
Kimberly Reyes 10/29/2014 11:33 AM Location: [facility] Patient #: [id number] DOB: 04-Apr-1956 Married / Language: English / Race: White Female  History of Present Illness Randall Hiss M. Ariyan Brisendine MD; 10/29/2014 12:37 PM) Patient words: gallbladder.  The patient is a 59 year old female who presents for evaluation of gall stones. She is referred by Dr. [name]. She states that she has been having some intermittent abdominal pain mainly on the right side radiating to her back and shoulder for about 6 weeks now. However her episodes are becoming more frequent. They normally lasts for about 1 hour and are associated with nausea. However this past Friday she went for her scheduled ultrasound of her abdomen which demonstrated gallstones. She had soup and grilled cheese for lunch and then developed abdominal pain associated with multiple episodes of nausea and vomiting. This prompted her to go to the emergency room in [place]. She had a mild white count. She was given IV fluids and sent home. Since then she has been eating a bland diet but has ongoing nausea with oral intake. But she denies emesis. She has had some mild attacks but nothing severe like this past Friday. She denies fever or chills. She does have alternating bouts of diarrhea and constipation. She stated the pain felt like labor pain. Her stools have been a little bit light-colored. She denies any instead use. Her only prior abdominal surgery has been a hysterectomy.   Other Problems [name], CMA; 10/29/2014 11:34 AM) Cholelithiasis Migraine Headache Thyroid Disease  Past Surgical History [name], CMA; 10/29/2014 11:34 AM) Colon Polyp Removal - Colonoscopy Hysterectomy (not due to cancer) - Complete  Diagnostic Studies History [name], CMA; 10/29/2014 11:34 AM) Colonoscopy 1-5 years ago Mammogram within last year Pap Smear 1-5 years ago  Allergies [name], CMA; 10/29/2014 11:34 AM) No  Known Drug Allergies02/18/2016  Medication History [name], CMA; 10/29/2014 11:38 AM) Biotin (10MG  Capsule, Oral) Active. Calcium Carbonate (1250MG /5ML Suspension, Oral) Active. Flexeril (10MG  Tablet, Oral) Active. Relpax (40MG  Tablet, Oral) Active. Levothyroxine Sodium (50MCG Tablet, Oral) Active. Magnesium-Potassium (70-70MG  Capsule, Oral) Active. Multi Vitamin Daily (Oral) Active. Fish Oil Burp-Less (1200MG  Capsule, Oral) Active. Hydrocodone-Acetaminophen (5-325MG  Tablet, Oral) Active. Zofran ODT (4MG  Tablet Disperse, Oral) Active.  Social History [name], [place]; 10/29/2014 11:34 AM) Alcohol use Occasional alcohol use. No caffeine use No drug use Tobacco use Former smoker.  Family History [name], [place]; 10/29/2014 11:34 AM) Alcohol Abuse Mother. Bleeding disorder Mother, Sister. Colon Cancer Father. Colon Polyps Father. Heart Disease Father. Heart disease in female family member before age 14 Ischemic Bowel Disease Father, Sister. Migraine Headache Mother. Thyroid problems Sister.  Pregnancy / Birth History [name], CMA; 10/29/2014 11:34 AM) Age at menarche 37 years. Age of menopause <43 Contraceptive History Oral contraceptives. Gravida 1 Irregular periods Maternal age 70-30 Para 1  Review of Systems [name] CMA; 10/29/2014 11:34 AM) General Present- Appetite Loss. Not Present- Chills, Fatigue, Fever, Night Sweats, Weight Gain and Weight Loss. Skin Not Present- Change in Wart/Mole, Dryness, Hives, Jaundice, New Lesions, Non-Healing Wounds, Rash and Ulcer. HEENT Not Present- Earache, Hearing Loss, Hoarseness, Nose Bleed, Oral Ulcers, Ringing in the Ears, Seasonal Allergies, Sinus Pain, Sore Throat, Visual Disturbances, Wears glasses/contact lenses and Yellow Eyes. Respiratory Not Present- Bloody sputum, Chronic Cough, Difficulty Breathing, Snoring and Wheezing. Breast Not Present- Breast Mass, Breast Pain, Nipple  Discharge and Skin Changes. Cardiovascular Present- Leg Cramps. Not Present- Chest Pain, Difficulty Breathing Lying Down, Palpitations, Rapid Heart Rate, Shortness of Breath  and Swelling of Extremities. Gastrointestinal Present- Abdominal Pain, Constipation, Nausea and Vomiting. Not Present- Bloating, Bloody Stool, Change in Bowel Habits, Chronic diarrhea, Difficulty Swallowing, Excessive gas, Gets full quickly at meals, Hemorrhoids, Indigestion and Rectal Pain. Female Genitourinary Not Present- Frequency, Nocturia, Painful Urination, Pelvic Pain and Urgency. Musculoskeletal Not Present- Back Pain, Joint Pain, Joint Stiffness, Muscle Pain, Muscle Weakness and Swelling of Extremities. Neurological Present- Headaches. Not Present- Decreased Memory, Fainting, Numbness, Seizures, Tingling, Tremor, Trouble walking and Weakness. Psychiatric Not Present- Anxiety, Bipolar, Change in Sleep Pattern, Depression, Fearful and Frequent crying. Endocrine Present- Cold Intolerance. Not Present- Excessive Hunger, Hair Changes, Heat Intolerance, Hot flashes and New Diabetes. Hematology Not Present- Easy Bruising, Excessive bleeding, Gland problems, HIV and Persistent Infections.   Vitals [name] CMA; 10/29/2014 11:38 AM) 10/29/2014 11:38 AM Weight: 165.8 lb Height: 69in Body Surface Area: 1.91 m Body Mass Index: 24.48 kg/m Temp.: 96.71F(Temporal)  Pulse: 72 (Regular)  Resp.: 16 (Unlabored)  BP: 132/78 (Sitting, Left Arm, Standard)    Physical Exam Randall Hiss M. Aastha Dayley MD; 10/29/2014 12:34 PM) General Mental Status-Alert. General Appearance-Consistent with stated age. Hydration-Well hydrated. Voice-Normal.  Head and Neck Head-normocephalic, atraumatic with no lesions or palpable masses. Trachea-midline. Thyroid Gland Characteristics - normal size and consistency.  Eye Eyeball - Bilateral-Extraocular movements intact. Sclera/Conjunctiva - Bilateral-No scleral  icterus.  Chest and Lung Exam Chest and lung exam reveals -quiet, even and easy respiratory effort with no use of accessory muscles and on auscultation, normal breath sounds, no adventitious sounds and normal vocal resonance. Inspection Chest Wall - Normal. Back - normal.  Breast - Did not examine.  Cardiovascular Cardiovascular examination reveals -normal heart sounds, regular rate and rhythm with no murmurs and normal pedal pulses bilaterally.  Abdomen Inspection Inspection of the abdomen reveals - No Hernias. Skin - Scar - no surgical scars. Palpation/Percussion Palpation and Percussion of the abdomen reveal - Soft, No Rebound tenderness, No Rigidity (guarding) and No hepatosplenomegaly. Note: mild subtle TTP. Auscultation Auscultation of the abdomen reveals - Bowel sounds normal.  Peripheral Vascular Upper Extremity Palpation - Pulses bilaterally normal.  Neurologic Neurologic evaluation reveals -alert and oriented x 3 with no impairment of recent or remote memory. Mental Status-Normal.  Neuropsychiatric The patient's mood and affect are described as -normal. Judgment and Insight-insight is appropriate concerning matters relevant to self.  Musculoskeletal Normal Exam - Left-Upper Extremity Strength Normal and Lower Extremity Strength Normal. Normal Exam - Right-Upper Extremity Strength Normal and Lower Extremity Strength Normal.  Lymphatic Head & Neck  General Head & Neck Lymphatics: Bilateral - Description - Normal. Axillary - Did not examine. Femoral & Inguinal - Did not examine.    Assessment & Plan Randall Hiss M. Deshay Blumenfeld MD; 10/29/2014 12:38 PM) SYMPTOMATIC CHOLELITHIASIS (574.20  K80.20) Impression: I do think her symptoms are due to gallbladder disease. See discussion below Current Plans  I believe the patient's symptoms are consistent with gallbladder disease.  We discussed gallbladder disease. The patient was given Neurosurgeon. We  discussed non-operative and operative management. We discussed the signs & symptoms of acute cholecystitis  I discussed laparoscopic cholecystectomy with IOC in detail. The patient was given educational material as well as diagrams detailing the procedure. We discussed the risks and benefits of a laparoscopic cholecystectomy including, but not limited to bleeding, infection, injury to surrounding structures such as the intestine or liver, bile leak, retained gallstones, need to convert to an open procedure, prolonged diarrhea, blood clots such as DVT, common bile duct injury, anesthesia risks, and possible need  for additional procedures. We discussed the typical post-operative recovery course. I explained that the likelihood of improvement of their symptoms is good.  The patient has elected to proceed with surgery. We will try to get her on the schedule as soon as possible. Schedule for Surgery  Leighton Ruff. Redmond Pulling, MD, FACS General, Bariatric, & Minimally Invasive Surgery [facility], [place]

## 2014-10-30 NOTE — Patient Instructions (Addendum)
Kimberly Reyes  10/30/2014   Your procedure is scheduled on: Tuesday February 23rd, 2016  Report to [facility] Main  Entrance and follow signs to               [place] at 1130 AM.  Call this number if you have problems the morning of surgery [phone]   Remember:  Do not eat food:After Midnight, may have clear liquids midnight night before surgery until 730 am, nothing by mouth after 730 am day of surgery.      Take these medicines the morning of surgery with A SIP OF WATER: levothryroxine                               You may not have any metal on your body including hair pins and              piercings  Do not wear jewelry, make-up, lotions, powders or perfumes.             Do not wear nail polish.  Do not shave  48 hours prior to surgery.              Men may shave face and neck.   Do not bring valuables to the hospital. [place].  Contacts, dentures or bridgework may not be worn into surgery.  Leave suitcase in the car. After surgery it may be brought to your room.     Patients discharged the day of surgery will not be allowed to drive home.  Name and phone number of your driver:  Special Instructions: N/A              Please read over the following fact sheets you were given: _____________________________________________________________________                CLEAR LIQUID DIET   Foods Allowed                                                                     Foods Excluded  Coffee and tea, regular and decaf                             liquids that you cannot  Plain Jell-O in any flavor                                             see through such as: Fruit ices (not with fruit pulp)                                     milk, soups, orange juice  Iced Popsicles  All solid food Carbonated beverages, regular and diet                                     Cranberry, grape and apple juices Sports drinks like Gatorade Lightly seasoned clear broth or consume(fat free) Sugar, honey syrup  Sample Menu Breakfast                                Lunch                                     Supper Cranberry juice                    Beef broth                            Chicken broth Jell-O                                     Grape juice                           Apple juice Coffee or tea                        Jell-O                                      Popsicle                                                Coffee or tea                        Coffee or tea  _____________________________________________________________________  [facility] - Preparing for Surgery Before surgery, you can play an important role.  Because skin is not sterile, your skin needs to be as free of germs as possible.  You can reduce the number of germs on your skin by washing with CHG (chlorahexidine gluconate) soap before surgery.  CHG is an antiseptic cleaner which kills germs and bonds with the skin to continue killing germs even after washing. Please DO NOT use if you have an allergy to CHG or antibacterial soaps.  If your skin becomes reddened/irritated stop using the CHG and inform your nurse when you arrive at Short Stay. Do not shave (including legs and underarms) for at least 48 hours prior to the first CHG shower.  You may shave your face/neck. Please follow these instructions carefully:  1.  Shower with CHG Soap the night before surgery and the  morning of Surgery.  2.  If you choose to wash your hair, wash your hair first as usual with your  normal  shampoo.  3.  After you shampoo, rinse your hair and body thoroughly to remove the  shampoo.  4.  Use CHG as you would any other liquid soap.  You can apply chg directly  to the skin and wash                       Gently with a scrungie or clean washcloth.  5.  Apply the CHG Soap to your body ONLY  FROM THE NECK DOWN.   Do not use on face/ open                           Wound or open sores. Avoid contact with eyes, ears mouth and genitals (private parts).                       Wash face,  Genitals (private parts) with your normal soap.             6.  Wash thoroughly, paying special attention to the area where your surgery  will be performed.  7.  Thoroughly rinse your body with warm water from the neck down.  8.  DO NOT shower/wash with your normal soap after using and rinsing off  the CHG Soap.                9.  Pat yourself dry with a clean towel.            10.  Wear clean pajamas.            11.  Place clean sheets on your bed the night of your first shower and do not  sleep with pets. Day of Surgery : Do not apply any lotions/deodorants the morning of surgery.  Please wear clean clothes to the hospital/surgery center.  FAILURE TO FOLLOW THESE INSTRUCTIONS MAY RESULT IN THE CANCELLATION OF YOUR SURGERY PATIENT SIGNATURE_________________________________  NURSE SIGNATURE__________________________________  ________________________________________________________________________

## 2014-11-02 ENCOUNTER — Encounter (HOSPITAL_COMMUNITY): Payer: Self-pay

## 2014-11-02 ENCOUNTER — Encounter (HOSPITAL_COMMUNITY)
Admission: RE | Admit: 2014-11-02 | Discharge: 2014-11-02 | Disposition: A | Discharge status: Home / Self Care | Payer: 59 | Source: Ambulatory Visit | Attending: General Surgery | Admitting: General Surgery

## 2014-11-02 DIAGNOSIS — G43909 Migraine, unspecified, not intractable, without status migrainosus: Secondary | ICD-10-CM | POA: Diagnosis not present

## 2014-11-02 DIAGNOSIS — Z79899 Other long term (current) drug therapy: Secondary | ICD-10-CM | POA: Diagnosis not present

## 2014-11-02 DIAGNOSIS — E079 Disorder of thyroid, unspecified: Secondary | ICD-10-CM | POA: Diagnosis not present

## 2014-11-02 DIAGNOSIS — K8010 Calculus of gallbladder with chronic cholecystitis without obstruction: Secondary | ICD-10-CM | POA: Diagnosis present

## 2014-11-02 HISTORY — DX: Hypothyroidism, unspecified: E03.9

## 2014-11-02 HISTORY — DX: Unspecified asthma, uncomplicated: J45.909

## 2014-11-02 LAB — COMPREHENSIVE METABOLIC PANEL
ALT: 28 U/L (ref 0–35)
AST: 34 U/L (ref 0–37)
Albumin: 4.4 g/dL (ref 3.5–5.2)
Alkaline Phosphatase: 75 U/L (ref 39–117)
Anion gap: 8 (ref 5–15)
BUN: 20 mg/dL (ref 6–23)
CALCIUM: 9.5 mg/dL (ref 8.4–10.5)
CO2: 29 mmol/L (ref 19–32)
CREATININE: 0.83 mg/dL (ref 0.50–1.10)
Chloride: 101 mmol/L (ref 96–112)
GFR calc Af Amer: 87 mL/min — ABNORMAL LOW (ref >90)
GFR, EST NON AFRICAN AMERICAN: 75 mL/min — AB (ref >90)
GLUCOSE: 102 mg/dL — AB (ref 70–99)
Potassium: 3.9 mmol/L (ref 3.5–5.1)
Sodium: 138 mmol/L (ref 135–145)
Total Bilirubin: 0.4 mg/dL (ref 0.3–1.2)
Total Protein: 7.6 g/dL (ref 6.0–8.3)

## 2014-11-02 LAB — CBC WITH DIFFERENTIAL/PLATELET
Basophils Absolute: 0.1 10*3/uL (ref 0.0–0.1)
Basophils Relative: 1 % (ref 0–1)
EOS ABS: 0.2 10*3/uL (ref 0.0–0.7)
EOS PCT: 2 % (ref 0–5)
HCT: 36.8 % (ref 36.0–46.0)
HEMOGLOBIN: 11.7 g/dL — AB (ref 12.0–15.0)
Lymphocytes Relative: 24 % (ref 12–46)
Lymphs Abs: 2 10*3/uL (ref 0.7–4.0)
MCH: 29.3 pg (ref 26.0–34.0)
MCHC: 31.8 g/dL (ref 30.0–36.0)
MCV: 92.2 fL (ref 78.0–100.0)
MONOS PCT: 8 % (ref 3–12)
Monocytes Absolute: 0.7 10*3/uL (ref 0.1–1.0)
NEUTROS ABS: 5.7 10*3/uL (ref 1.7–7.7)
NEUTROS PCT: 65 % (ref 43–77)
Platelets: 283 10*3/uL (ref 150–400)
RBC: 3.99 MIL/uL (ref 3.87–5.11)
RDW: 11.9 % (ref 11.5–15.5)
WBC: 8.6 10*3/uL (ref 4.0–10.5)

## 2014-11-02 NOTE — Progress Notes (Signed)
holter monitor report 10-23-14 care every where epic

## 2014-11-03 ENCOUNTER — Ambulatory Visit (HOSPITAL_COMMUNITY): Payer: 59

## 2014-11-03 ENCOUNTER — Ambulatory Visit (HOSPITAL_COMMUNITY)
Admission: RE | Admit: 2014-11-03 | Discharge: 2014-11-03 | Disposition: A | Discharge status: Home / Self Care | Payer: 59 | Source: Ambulatory Visit | Attending: General Surgery | Admitting: General Surgery

## 2014-11-03 ENCOUNTER — Ambulatory Visit (HOSPITAL_COMMUNITY): Payer: 59 | Admitting: Anesthesiology

## 2014-11-03 ENCOUNTER — Encounter (HOSPITAL_COMMUNITY): Payer: Self-pay | Admitting: *Deleted

## 2014-11-03 ENCOUNTER — Encounter (HOSPITAL_COMMUNITY)
Admission: RE | Disposition: A | Discharge status: Home / Self Care | Payer: Self-pay | Source: Ambulatory Visit | Attending: General Surgery

## 2014-11-03 DIAGNOSIS — K8020 Calculus of gallbladder without cholecystitis without obstruction: Secondary | ICD-10-CM

## 2014-11-03 DIAGNOSIS — K8010 Calculus of gallbladder with chronic cholecystitis without obstruction: Secondary | ICD-10-CM | POA: Diagnosis not present

## 2014-11-03 DIAGNOSIS — G43909 Migraine, unspecified, not intractable, without status migrainosus: Secondary | ICD-10-CM | POA: Insufficient documentation

## 2014-11-03 DIAGNOSIS — Z79899 Other long term (current) drug therapy: Secondary | ICD-10-CM | POA: Insufficient documentation

## 2014-11-03 DIAGNOSIS — E079 Disorder of thyroid, unspecified: Secondary | ICD-10-CM | POA: Insufficient documentation

## 2014-11-03 HISTORY — PX: CHOLECYSTECTOMY: SHX55

## 2014-11-03 SURGERY — LAPAROSCOPIC CHOLECYSTECTOMY WITH INTRAOPERATIVE CHOLANGIOGRAM
Anesthesia: General | Site: Abdomen

## 2014-11-03 MED ORDER — DIATRIZOATE MEGLUMINE 30 % UR SOLN
URETHRAL | Status: DC | PRN
Start: 2014-11-03 — End: 2014-11-03
  Administered 2014-11-03: 3.5 mL

## 2014-11-03 MED ORDER — EPHEDRINE SULFATE 50 MG/ML IJ SOLN
INTRAMUSCULAR | Status: DC | PRN
  Administered 2014-11-03: 10 mg via INTRAVENOUS
  Administered 2014-11-03: 5 mg via INTRAVENOUS

## 2014-11-03 MED ORDER — LACTATED RINGERS IV SOLN
INTRAVENOUS | Status: DC | PRN
  Administered 2014-11-03 (×2): via INTRAVENOUS

## 2014-11-03 MED ORDER — FENTANYL CITRATE 0.05 MG/ML IJ SOLN
INTRAMUSCULAR | Status: AC
  Filled 2014-11-03: qty 5

## 2014-11-03 MED ORDER — METOCLOPRAMIDE HCL 5 MG/ML IJ SOLN
INTRAMUSCULAR | Status: DC | PRN
  Administered 2014-11-03: 10 mg via INTRAVENOUS

## 2014-11-03 MED ORDER — GLYCOPYRROLATE 0.2 MG/ML IJ SOLN
INTRAMUSCULAR | Status: DC | PRN
  Administered 2014-11-03: 0.6 mg via INTRAVENOUS

## 2014-11-03 MED ORDER — HYDROMORPHONE HCL 1 MG/ML IJ SOLN
0.2500 mg | INTRAMUSCULAR | Status: DC | PRN
  Administered 2014-11-03: 0.5 mg via INTRAVENOUS

## 2014-11-03 MED ORDER — CEFOXITIN SODIUM 2 G IV SOLR
2.0000 g | INTRAVENOUS | Status: AC
  Administered 2014-11-03: 2 g via INTRAVENOUS

## 2014-11-03 MED ORDER — SODIUM CHLORIDE 0.9 % IJ SOLN: 3.0000 mL | INTRAMUSCULAR | Status: DC | PRN

## 2014-11-03 MED ORDER — NEOSTIGMINE METHYLSULFATE 10 MG/10ML IV SOLN
INTRAVENOUS | Status: AC
  Filled 2014-11-03: qty 1

## 2014-11-03 MED ORDER — LIDOCAINE HCL (CARDIAC) 20 MG/ML IV SOLN
INTRAVENOUS | Status: DC | PRN
  Administered 2014-11-03: 50 mg via INTRAVENOUS

## 2014-11-03 MED ORDER — NEOSTIGMINE METHYLSULFATE 10 MG/10ML IV SOLN
INTRAVENOUS | Status: DC | PRN
  Administered 2014-11-03: 4 mg via INTRAVENOUS

## 2014-11-03 MED ORDER — BUPIVACAINE-EPINEPHRINE (PF) 0.25% -1:200000 IJ SOLN
INTRAMUSCULAR | Status: AC
  Filled 2014-11-03: qty 30

## 2014-11-03 MED ORDER — KETOROLAC TROMETHAMINE 30 MG/ML IJ SOLN
INTRAMUSCULAR | Status: DC | PRN
  Administered 2014-11-03: 30 mg via INTRAVENOUS

## 2014-11-03 MED ORDER — ONDANSETRON HCL 4 MG/2ML IJ SOLN
INTRAMUSCULAR | Status: AC
  Filled 2014-11-03: qty 2

## 2014-11-03 MED ORDER — SODIUM CHLORIDE 0.9 % IV SOLN: 250.0000 mL | INTRAVENOUS | Status: DC | PRN

## 2014-11-03 MED ORDER — PROPOFOL 10 MG/ML IV BOLUS
INTRAVENOUS | Status: AC
  Filled 2014-11-03: qty 20

## 2014-11-03 MED ORDER — BUPIVACAINE-EPINEPHRINE 0.25% -1:200000 IJ SOLN
INTRAMUSCULAR | Status: DC | PRN
  Administered 2014-11-03: 30 mL

## 2014-11-03 MED ORDER — HYDROCODONE-ACETAMINOPHEN 5-325 MG PO TABS: 1.0000 | ORAL_TABLET | Freq: Four times a day (QID) | ORAL | Status: DC | PRN

## 2014-11-03 MED ORDER — MORPHINE SULFATE 10 MG/ML IJ SOLN: 1.0000 mg | INTRAMUSCULAR | Status: DC | PRN

## 2014-11-03 MED ORDER — PROPOFOL 10 MG/ML IV BOLUS
INTRAVENOUS | Status: DC | PRN
Start: 2014-11-03 — End: 2014-11-03
  Administered 2014-11-03: 170 mg via INTRAVENOUS

## 2014-11-03 MED ORDER — LIDOCAINE HCL (CARDIAC) 20 MG/ML IV SOLN
INTRAVENOUS | Status: AC
  Filled 2014-11-03: qty 5

## 2014-11-03 MED ORDER — MIDAZOLAM HCL 2 MG/2ML IJ SOLN
INTRAMUSCULAR | Status: AC
  Filled 2014-11-03: qty 2

## 2014-11-03 MED ORDER — DEXTROSE 5 % IV SOLN
INTRAVENOUS | Status: AC
  Filled 2014-11-03: qty 2

## 2014-11-03 MED ORDER — PHENYLEPHRINE HCL 10 MG/ML IJ SOLN
INTRAMUSCULAR | Status: DC | PRN
  Administered 2014-11-03: 40 ug via INTRAVENOUS
  Administered 2014-11-03 (×3): 80 ug via INTRAVENOUS

## 2014-11-03 MED ORDER — FENTANYL CITRATE 0.05 MG/ML IJ SOLN
INTRAMUSCULAR | Status: DC | PRN
Start: 2014-11-03 — End: 2014-11-03
  Administered 2014-11-03: 50 ug via INTRAVENOUS
  Administered 2014-11-03: 100 ug via INTRAVENOUS
  Administered 2014-11-03 (×2): 25 ug via INTRAVENOUS

## 2014-11-03 MED ORDER — LACTATED RINGERS IV SOLN
INTRAVENOUS | Status: DC | PRN
  Administered 2014-11-03: 1000 mL

## 2014-11-03 MED ORDER — MIDAZOLAM HCL 5 MG/5ML IJ SOLN
INTRAMUSCULAR | Status: DC | PRN
  Administered 2014-11-03 (×2): 1 mg via INTRAVENOUS

## 2014-11-03 MED ORDER — OXYCODONE HCL 5 MG PO TABS
5.0000 mg | ORAL_TABLET | ORAL | Status: DC | PRN
  Administered 2014-11-03: 5 mg via ORAL
  Filled 2014-11-03: qty 1

## 2014-11-03 MED ORDER — HYDROMORPHONE HCL 1 MG/ML IJ SOLN
INTRAMUSCULAR | Status: AC
  Filled 2014-11-03: qty 1

## 2014-11-03 MED ORDER — SODIUM CHLORIDE 0.9 % IJ SOLN: 3.0000 mL | Freq: Two times a day (BID) | INTRAMUSCULAR | Status: DC

## 2014-11-03 MED ORDER — POTASSIUM CHLORIDE IN NACL 20-0.9 MEQ/L-% IV SOLN: INTRAVENOUS | Status: DC

## 2014-11-03 MED ORDER — 0.9 % SODIUM CHLORIDE (POUR BTL) OPTIME
TOPICAL | Status: DC | PRN
  Administered 2014-11-03: 1000 mL

## 2014-11-03 MED ORDER — ROCURONIUM BROMIDE 100 MG/10ML IV SOLN
INTRAVENOUS | Status: AC
  Filled 2014-11-03: qty 1

## 2014-11-03 MED ORDER — ACETAMINOPHEN 650 MG RE SUPP
650.0000 mg | RECTAL | Status: DC | PRN
  Filled 2014-11-03: qty 1

## 2014-11-03 MED ORDER — EPHEDRINE SULFATE 50 MG/ML IJ SOLN
INTRAMUSCULAR | Status: AC
  Filled 2014-11-03: qty 1

## 2014-11-03 MED ORDER — GLYCOPYRROLATE 0.2 MG/ML IJ SOLN
INTRAMUSCULAR | Status: AC
  Filled 2014-11-03: qty 3

## 2014-11-03 MED ORDER — ROCURONIUM BROMIDE 100 MG/10ML IV SOLN
INTRAVENOUS | Status: DC | PRN
  Administered 2014-11-03: 40 mg via INTRAVENOUS

## 2014-11-03 MED ORDER — PHENYLEPHRINE 40 MCG/ML (10ML) SYRINGE FOR IV PUSH (FOR BLOOD PRESSURE SUPPORT)
PREFILLED_SYRINGE | INTRAVENOUS | Status: AC
  Filled 2014-11-03: qty 10

## 2014-11-03 MED ORDER — DEXAMETHASONE SODIUM PHOSPHATE 10 MG/ML IJ SOLN
INTRAMUSCULAR | Status: DC | PRN
  Administered 2014-11-03: 10 mg via INTRAVENOUS

## 2014-11-03 MED ORDER — SODIUM CHLORIDE 0.9 % IJ SOLN
INTRAMUSCULAR | Status: AC
  Filled 2014-11-03: qty 10

## 2014-11-03 MED ORDER — ACETAMINOPHEN 325 MG PO TABS: 650.0000 mg | ORAL_TABLET | ORAL | Status: DC | PRN

## 2014-11-03 MED ORDER — LACTATED RINGERS IV SOLN
INTRAVENOUS | Status: DC
  Administered 2014-11-03: 1000 mL via INTRAVENOUS

## 2014-11-03 SURGICAL SUPPLY — 33 items
APPLIER CLIP 5 13 M/L LIGAMAX5 (MISCELLANEOUS) ×2
APPLIER CLIP ROT 10 11.4 M/L (STAPLE)
APR CLP MED LRG 11.4X10 (STAPLE)
APR CLP MED LRG 5 ANG JAW (MISCELLANEOUS) ×1
BAG SPEC RTRVL LRG 6X4 10 (ENDOMECHANICALS) ×1
CHLORAPREP W/TINT 26ML (MISCELLANEOUS) ×2 IMPLANT
CLIP APPLIE 5 13 M/L LIGAMAX5 (MISCELLANEOUS) IMPLANT
CLIP APPLIE ROT 10 11.4 M/L (STAPLE) IMPLANT
COVER MAYO STAND STRL (DRAPES) IMPLANT
DECANTER SPIKE VIAL GLASS SM (MISCELLANEOUS) ×1 IMPLANT
DRAPE C-ARM 42X120 X-RAY (DRAPES) ×1 IMPLANT
DRAPE LAPAROSCOPIC ABDOMINAL (DRAPES) ×2 IMPLANT
ELECT REM PT RETURN 9FT ADLT (ELECTROSURGICAL) ×2
ELECTRODE REM PT RTRN 9FT ADLT (ELECTROSURGICAL) ×1 IMPLANT
GLOVE BIO SURGEON STRL SZ7.5 (GLOVE) ×2 IMPLANT
GLOVE BIOGEL M STRL SZ7.5 (GLOVE) IMPLANT
GLOVE BIOGEL PI IND STRL 7.0 (GLOVE) ×1 IMPLANT
GLOVE BIOGEL PI INDICATOR 7.0 (GLOVE) ×1
GOWN STRL REUS W/TWL XL LVL3 (GOWN DISPOSABLE) ×6 IMPLANT
KIT BASIN OR (CUSTOM PROCEDURE TRAY) ×2 IMPLANT
LIQUID BAND (GAUZE/BANDAGES/DRESSINGS) ×2 IMPLANT
NS IRRIG 1000ML POUR BTL (IV SOLUTION) ×2 IMPLANT
POUCH SPECIMEN RETRIEVAL 10MM (ENDOMECHANICALS) ×2 IMPLANT
SET CHOLANGIOGRAPH MIX (MISCELLANEOUS) ×1 IMPLANT
SET IRRIG TUBING LAPAROSCOPIC (IRRIGATION / IRRIGATOR) ×2 IMPLANT
SLEEVE XCEL OPT CAN 5 100 (ENDOMECHANICALS) ×4 IMPLANT
SUT MNCRL AB 4-0 PS2 18 (SUTURE) ×2 IMPLANT
TOWEL OR 17X26 10 PK STRL BLUE (TOWEL DISPOSABLE) ×2 IMPLANT
TOWEL OR NON WOVEN STRL DISP B (DISPOSABLE) ×2 IMPLANT
TRAY LAPAROSCOPIC (CUSTOM PROCEDURE TRAY) ×2 IMPLANT
TROCAR BLADELESS OPT 5 100 (ENDOMECHANICALS) ×2 IMPLANT
TROCAR XCEL BLUNT TIP 100MML (ENDOMECHANICALS) ×2 IMPLANT
TROCAR XCEL NON-BLD 11X100MML (ENDOMECHANICALS) IMPLANT

## 2014-11-03 NOTE — Transfer of Care (Signed)
Immediate Anesthesia Transfer of Care Note  Patient: Kimberly Reyes  Procedure(s) Performed: Procedure(s): LAPAROSCOPIC CHOLECYSTECTOMY WITH INTRAOPERATIVE CHOLANGIOGRAM (N/A)  Patient Location: PACU  Anesthesia Type:General  Level of Consciousness: awake, alert  and oriented  Airway & Oxygen Therapy: Patient Spontanous Breathing and Patient connected to face mask oxygen  Post-op Assessment: Report given to RN and Post -op Vital signs reviewed and stable  Post vital signs: Reviewed and stable  Last Vitals:  Filed Vitals:   11/03/14 1121  BP: 132/79  Pulse: 92  Temp: 36.5 C  Resp: 16    Complications: No apparent anesthesia complications

## 2014-11-03 NOTE — Anesthesia Postprocedure Evaluation (Signed)
  Anesthesia Post-op Note  Patient: Kimberly Reyes  Procedure(s) Performed: Procedure(s) (LRB): LAPAROSCOPIC CHOLECYSTECTOMY WITH INTRAOPERATIVE CHOLANGIOGRAM (N/A)  Patient Location: PACU  Anesthesia Type: General  Level of Consciousness: awake and alert   Airway and Oxygen Therapy: Patient Spontanous Breathing  Post-op Pain: mild  Post-op Assessment: Post-op Vital signs reviewed, Patient's Cardiovascular Status Stable, Respiratory Function Stable, Patent Airway and No signs of Nausea or vomiting  Last Vitals:  Filed Vitals:   11/03/14 1641  BP: 140/71  Pulse: 90  Temp: 36.7 C  Resp: 12    Post-op Vital Signs: stable   Complications: No apparent anesthesia complications

## 2014-11-03 NOTE — Interval H&P Note (Signed)
History and Physical Interval Note:  11/03/2014 1:12 PM  Kimberly Reyes  has presented today for surgery, with the diagnosis of Cholelithiasis  The various methods of treatment have been discussed with the patient and family. After consideration of risks, benefits and other options for treatment, the patient has consented to  Procedure(s): LAPAROSCOPIC CHOLECYSTECTOMY WITH INTRAOPERATIVE CHOLANGIOGRAM (N/A) as a surgical intervention .  The patient's history has been reviewed, patient examined, no change in status, stable for surgery.  I have reviewed the patient's chart and labs.  Questions were answered to the patient's satisfaction.    Leighton Ruff. Redmond Pulling, MD, [place], Bariatric, & Minimally Invasive Surgery [facility], [place]   [facility] M

## 2014-11-03 NOTE — Anesthesia Preprocedure Evaluation (Addendum)
Anesthesia Evaluation  Patient identified by MRN, date of birth, ID band Patient awake    Reviewed: Allergy & Precautions, H&P , NPO status , Patient's Chart, lab work & pertinent test results  Airway Mallampati: III  TM Distance: >3 FB Neck ROM: Full    Dental no notable dental hx. (+) Teeth Intact, Dental Advisory Given   Pulmonary neg pulmonary ROS,  breath sounds clear to auscultation  Pulmonary exam normal       Cardiovascular negative cardio ROS  Rhythm:Regular Rate:Normal     Neuro/Psych  Headaches, Anxiety negative psych ROS   GI/Hepatic negative GI ROS, Neg liver ROS,   Endo/Other  Hypothyroidism   Renal/GU negative Renal ROS  negative genitourinary   Musculoskeletal   Abdominal   Peds  Hematology negative hematology ROS (+)   Anesthesia Other Findings   Reproductive/Obstetrics negative OB ROS                            Anesthesia Physical Anesthesia Plan  ASA: II  Anesthesia Plan: General   Post-op Pain Management:    Induction: Intravenous  Airway Management Planned: Oral ETT  Additional Equipment:   Intra-op Plan:   Post-operative Plan: Extubation in OR  Informed Consent: I have reviewed the patients History and Physical, chart, labs and discussed the procedure including the risks, benefits and alternatives for the proposed anesthesia with the patient or authorized representative who has indicated his/her understanding and acceptance.   Dental advisory given  Plan Discussed with: CRNA  Anesthesia Plan Comments:         Anesthesia Quick Evaluation

## 2014-11-03 NOTE — Op Note (Signed)
ZAMYA CULHANE [id number] 07-31-1956 11/03/2014  Laparoscopic Cholecystectomy with IOC Procedure Note  Indications: This patient presents with symptomatic gallbladder disease and will undergo laparoscopic cholecystectomy.  Pre-operative Diagnosis: symptomatic cholelithiasis  Post-operative Diagnosis: Same  Surgeon: Gayland Curry   Assistants: none  Anesthesia: General endotracheal anesthesia  ASA Class: 2  Procedure Details  The patient was seen again in the Holding Room. The risks, benefits, complications, treatment options, and expected outcomes were discussed with the patient. The possibilities of reaction to medication, pulmonary aspiration, perforation of viscus, bleeding, recurrent infection, finding a normal gallbladder, the need for additional procedures, failure to diagnose a condition, the possible need to convert to an open procedure, and creating a complication requiring transfusion or operation were discussed with the patient. The likelihood of improving the patient's symptoms with return to their baseline status is good.  The patient and/or family concurred with the proposed plan, giving informed consent. The site of surgery properly noted. The patient was taken to Operating Room, identified as ADORIA KAWAMOTO and the procedure verified as Laparoscopic Cholecystectomy with Intraoperative Cholangiogram. A Time Out was held and the above information confirmed. Antibiotic prophylaxis was administered.   Prior to the induction of general anesthesia, antibiotic prophylaxis was administered. General endotracheal anesthesia was then administered and tolerated well. After the induction, the abdomen was prepped with Chloraprep and draped in the sterile fashion. The patient was positioned in the supine position.  Local anesthetic agent was injected into the skin near the umbilicus and an incision made. We dissected down to the abdominal fascia with blunt dissection.  The fascia was  incised vertically and we entered the peritoneal cavity bluntly.  A pursestring suture of 0-Vicryl was placed around the fascial opening.  The Hasson cannula was inserted and secured with the stay suture.  Pneumoperitoneum was then created with CO2 and tolerated well without any adverse changes in the patient's vital signs. An 5-mm port was placed in the subxiphoid position.  Two 5-mm ports were placed in the right upper quadrant. All skin incisions were infiltrated with a local anesthetic agent before making the incision and placing the trocars.   We positioned the patient in reverse Trendelenburg, tilted slightly to the patient's left.  The gallbladder was identified, the fundus grasped and retracted cephalad. Adhesions were lysed bluntly and with the electrocautery where indicated, taking care not to injure any adjacent organs or viscus. The infundibulum was grasped and retracted laterally, exposing the peritoneum overlying the triangle of Calot. This was then divided and exposed in a blunt fashion. A critical view of the cystic duct and cystic artery was obtained.  The cystic duct was clearly identified and bluntly dissected circumferentially. The cystic duct was ligated with a clip distally.   An incision was made in the cystic duct and the [facility] cholangiogram catheter introduced. The catheter was secured using a clip. A cholangiogram was then obtained which showed good visualization of the distal and proximal biliary tree with no sign of filling defects or obstruction.  Contrast flowed easily into the duodenum. The catheter was then removed.   The cystic duct was then ligated with clips and divided. The cystic artery which had been identified & dissected free was ligated with clips and divided as well.   The gallbladder was dissected from the liver bed in retrograde fashion with the electrocautery. The gallbladder was removed and placed in an Endocatch sac.  The gallbladder and Endocatch sac were then  removed through the umbilical port site. The  liver bed was irrigated and inspected. Hemostasis was achieved with the electrocautery. Copious irrigation was utilized and was repeatedly aspirated until clear.  The pursestring suture was used to close the umbilical fascia.    We again inspected the right upper quadrant for hemostasis.  The umbilical closure was inspected and there was no air leak and nothing trapped within the closure. Pneumoperitoneum was released as we removed the trocars.  4-0 Monocryl was used to close the skin.   Liquid band exceed was applied. The patient was then extubated and brought to the recovery room in stable condition. Instrument, sponge, and needle counts were correct at closure and at the conclusion of the case.   Findings: Chronic Cholecystitis with Cholelithiasis  Estimated Blood Loss: Minimal         Drains: none         Specimens: Gallbladder           Complications: None; patient tolerated the procedure well.         Disposition: PACU - hemodynamically stable.         Condition: stable  Leighton Ruff. Redmond Pulling, MD, FACS General, Bariatric, & Minimally Invasive Surgery [facility], [place]

## 2014-11-03 NOTE — H&P (View-Only) (Signed)
Kimberly Reyes 10/29/2014 11:33 AM Location: [facility] Patient #: [id number] DOB: [birth year] Married / Language: English / Race: White Female  History of Present Illness Kimberly Hiss M. Warrick Llera MD; 10/29/2014 12:37 PM) Patient words: gallbladder.  The patient is a 59 year old female who presents for evaluation of gall stones. She is referred by Dr. [name]. She states that she has been having some intermittent abdominal pain mainly on the right side radiating to her back and shoulder for about 6 weeks now. However her episodes are becoming more frequent. They normally lasts for about 1 hour and are associated with nausea. However this past Friday she went for her scheduled ultrasound of her abdomen which demonstrated gallstones. She had soup and grilled cheese for lunch and then developed abdominal pain associated with multiple episodes of nausea and vomiting. This prompted her to go to the emergency room in [employer] [facility]. She had a mild white count. She was given IV fluids and sent home. Since then she has been eating a bland diet but has ongoing nausea with oral intake. But she denies emesis. She has had some mild attacks but nothing severe like this past Friday. She denies fever or chills. She does have alternating bouts of diarrhea and constipation. She stated the pain felt like labor pain. Her stools have been a little bit light-colored. She denies any instead use. Her only prior abdominal surgery has been a hysterectomy.   Other Problems Kimberly Reyes, CMA; 10/29/2014 11:34 AM) Cholelithiasis Migraine Headache Thyroid Disease  Past Surgical History Kimberly Reyes, CMA; 10/29/2014 11:34 AM) Colon Polyp Removal - Colonoscopy Hysterectomy (not due to cancer) - Complete  Diagnostic Studies History Kimberly Reyes, CMA; 10/29/2014 11:34 AM) Colonoscopy 1-5 years ago Mammogram within last year Pap Smear 1-5 years ago  Allergies Kimberly Reyes, CMA; 10/29/2014 11:34 AM) No  Known Drug Allergies02/18/2016  Medication History Kimberly Reyes, CMA; 10/29/2014 11:38 AM) Biotin (10MG  Capsule, Oral) Active. Calcium Carbonate (1250MG /5ML Suspension, Oral) Active. Flexeril (10MG  Tablet, Oral) Active. Relpax (40MG  Tablet, Oral) Active. Levothyroxine Sodium (50MCG Tablet, Oral) Active. Magnesium-Potassium (70-70MG  Capsule, Oral) Active. Multi Vitamin Daily (Oral) Active. Fish Oil Burp-Less (1200MG  Capsule, Oral) Active. Hydrocodone-Acetaminophen (5-325MG  Tablet, Oral) Active. Zofran ODT (4MG  Tablet Disperse, Oral) Active.  Social History Kimberly Reyes, [place]; 10/29/2014 11:34 AM) Alcohol use Occasional alcohol use. No caffeine use No drug use Tobacco use Former smoker.  Family History Kimberly Reyes, [place]; 10/29/2014 11:34 AM) Alcohol Abuse Mother. Bleeding disorder Mother, Sister. Colon Cancer Father. Colon Polyps Father. Heart Disease Father. Heart disease in female family member before age 73 Ischemic Bowel Disease Father, Sister. Migraine Headache Mother. Thyroid problems Sister.  Pregnancy / Birth History Kimberly Reyes, CMA; 10/29/2014 11:34 AM) Age at menarche 72 years. Age of menopause <43 Contraceptive History Oral contraceptives. Gravida 1 Irregular periods Maternal age 57-30 Para 1  Review of Systems Kimberly Reyes CMA; 10/29/2014 11:34 AM) General Present- Appetite Loss. Not Present- Chills, Fatigue, Fever, Night Sweats, Weight Gain and Weight Loss. Skin Not Present- Change in Wart/Mole, Dryness, Hives, Jaundice, New Lesions, Non-Healing Wounds, Rash and Ulcer. HEENT Not Present- Earache, Hearing Loss, Hoarseness, Nose Bleed, Oral Ulcers, Ringing in the Ears, Seasonal Allergies, Sinus Pain, Sore Throat, Visual Disturbances, Wears glasses/contact lenses and Yellow Eyes. Respiratory Not Present- Bloody sputum, Chronic Cough, Difficulty Breathing, Snoring and Wheezing. Breast Not Present- Breast Mass, Breast Pain, Nipple  Discharge and Skin Changes. Cardiovascular Present- Leg Cramps. Not Present- Chest Pain, Difficulty Breathing Lying Down, Palpitations, Rapid Heart Rate, Shortness of Breath  and Swelling of Extremities. Gastrointestinal Present- Abdominal Pain, Constipation, Nausea and Vomiting. Not Present- Bloating, Bloody Stool, Change in Bowel Habits, Chronic diarrhea, Difficulty Swallowing, Excessive gas, Gets full quickly at meals, Hemorrhoids, Indigestion and Rectal Pain. Female Genitourinary Not Present- Frequency, Nocturia, Painful Urination, Pelvic Pain and Urgency. Musculoskeletal Not Present- Back Pain, Joint Pain, Joint Stiffness, Muscle Pain, Muscle Weakness and Swelling of Extremities. Neurological Present- Headaches. Not Present- Decreased Memory, Fainting, Numbness, Seizures, Tingling, Tremor, Trouble walking and Weakness. Psychiatric Not Present- Anxiety, Bipolar, Change in Sleep Pattern, Depression, Fearful and Frequent crying. Endocrine Present- Cold Intolerance. Not Present- Excessive Hunger, Hair Changes, Heat Intolerance, Hot flashes and New Diabetes. Hematology Not Present- Easy Bruising, Excessive bleeding, Gland problems, HIV and Persistent Infections.   Vitals Kimberly Reyes CMA; 10/29/2014 11:38 AM) 10/29/2014 11:38 AM Weight: 165.8 lb Height: 69in Body Surface Area: 1.91 m Body Mass Index: 24.48 kg/m Temp.: 96.37F(Temporal)  Pulse: 72 (Regular)  Resp.: 16 (Unlabored)  BP: 132/78 (Sitting, Left Arm, Standard)    Physical Exam Kimberly Hiss M. Pearlie Lafosse MD; 10/29/2014 12:34 PM) General Mental Status-Alert. General Appearance-Consistent with stated age. Hydration-Well hydrated. Voice-Normal.  Head and Neck Head-normocephalic, atraumatic with no lesions or palpable masses. Trachea-midline. Thyroid Gland Characteristics - normal size and consistency.  Eye Eyeball - Bilateral-Extraocular movements intact. Sclera/Conjunctiva - Bilateral-No scleral  icterus.  Chest and Lung Exam Chest and lung exam reveals -quiet, even and easy respiratory effort with no use of accessory muscles and on auscultation, normal breath sounds, no adventitious sounds and normal vocal resonance. Inspection Chest Wall - Normal. Back - normal.  Breast - Did not examine.  Cardiovascular Cardiovascular examination reveals -normal heart sounds, regular rate and rhythm with no murmurs and normal pedal pulses bilaterally.  Abdomen Inspection Inspection of the abdomen reveals - No Hernias. Skin - Scar - no surgical scars. Palpation/Percussion Palpation and Percussion of the abdomen reveal - Soft, No Rebound tenderness, No Rigidity (guarding) and No hepatosplenomegaly. Note: mild subtle TTP. Auscultation Auscultation of the abdomen reveals - Bowel sounds normal.  Peripheral Vascular Upper Extremity Palpation - Pulses bilaterally normal.  Neurologic Neurologic evaluation reveals -alert and oriented x 3 with no impairment of recent or remote memory. Mental Status-Normal.  Neuropsychiatric The patient's mood and affect are described as -normal. Judgment and Insight-insight is appropriate concerning matters relevant to self.  Musculoskeletal Normal Exam - Left-Upper Extremity Strength Normal and Lower Extremity Strength Normal. Normal Exam - Right-Upper Extremity Strength Normal and Lower Extremity Strength Normal.  Lymphatic Head & Neck  General Head & Neck Lymphatics: Bilateral - Description - Normal. Axillary - Did not examine. Femoral & Inguinal - Did not examine.    Assessment & Plan Kimberly Hiss M. Darnetta Kesselman MD; 10/29/2014 12:38 PM) SYMPTOMATIC CHOLELITHIASIS (574.20  K80.20) Impression: I do think her symptoms are due to gallbladder disease. See discussion below Current Plans  I believe the patient's symptoms are consistent with gallbladder disease.  We discussed gallbladder disease. The patient was given Neurosurgeon. We  discussed non-operative and operative management. We discussed the signs & symptoms of acute cholecystitis  I discussed laparoscopic cholecystectomy with IOC in detail. The patient was given educational material as well as diagrams detailing the procedure. We discussed the risks and benefits of a laparoscopic cholecystectomy including, but not limited to bleeding, infection, injury to surrounding structures such as the intestine or liver, bile leak, retained gallstones, need to convert to an open procedure, prolonged diarrhea, blood clots such as DVT, common bile duct injury, anesthesia risks, and possible need  for additional procedures. We discussed the typical post-operative recovery course. I explained that the likelihood of improvement of their symptoms is good.  The patient has elected to proceed with surgery. We will try to get her on the schedule as soon as possible. Schedule for Surgery  Leighton Ruff. Redmond Pulling, MD, FACS General, Bariatric, & Minimally Invasive Surgery [facility], [place]

## 2014-11-03 NOTE — Discharge Instructions (Signed)
[facility] LAP[facility]OSCOP[facility]: POST OP INSTRUCTIONS Always review your discharge instruction sheet given to you by the facility where your surgery was performed. IF YOU HAVE DISABILITY OR FAMILY LEAVE FORMS, YOU MUST BRING THEM TO THE OFFICE FOR PROCESSING.   DO NOT GIVE THEM TO YOUR DOCTOR.  1. A prescription for pain medication may be given to you upon discharge.  Take your pain medication as prescribed, if needed.  If narcotic pain medicine is not needed, then you may take acetaminophen (Tylenol) or ibuprofen (Advil) as needed. 2. Take your usually prescribed medications unless otherwise directed. 3. If you need a refill on your pain medication, please contact your pharmacy.  They will contact our office to request authorization. Prescriptions will not be filled after 5pm or on week-ends. 4. You should follow a light diet the first few days after arrival home, such as soup and crackers, etc.  Be sure to include lots of fluids daily. 5. Most patients will experience some swelling and bruising in the area of the incisions.  Ice packs will help.  Swelling and bruising can take several days to resolve.  6. It is common to experience some constipation if taking pain medication after surgery.  Increasing fluid intake and taking a stool softener (such as Colace) will usually help or prevent this problem from occurring.  A mild laxative (Milk of Magnesia or Miralax) should be taken according to package instructions if there are no bowel movements after 48 hours. 7.  If your surgeon used skin glue on the incision, you may shower in 24 hours.  The glue will flake off over the next 2-3 weeks.  Any sutures or staples will be removed at the office during your follow-up visit. 8. ACTIVITIES:  You may resume regular (light) daily activities beginning the next day--such as daily self-care, walking, climbing stairs--gradually increasing activities as tolerated.  You may have sexual  intercourse when it is comfortable.  Refrain from any heavy lifting or straining until approved by your doctor. a. You may drive when you are no longer taking prescription pain medication, you can comfortably wear a seatbelt, and you can safely maneuver your car and apply brakes. 9. You should see your doctor in the office for a follow-up appointment approximately 2-3 weeks after your surgery.  Make sure that you call for this appointment within a day or two after you arrive home to insure a convenient appointment time. 10. OTHER INSTRUCTIONS:  WHEN TO CALL YOUR DOCTOR: 1. Fever over 101.0 2. Inability to urinate 3. Continued bleeding from incision. 4. Increased pain, redness, or drainage from the incision. 5. Increasing abdominal pain  The clinic staff is available to answer your questions during regular business hours.  Please dont hesitate to call and ask to speak to one of the nurses for clinical concerns.  If you have a medical emergency, go to the nearest emergency room or call 911.  A surgeon from [facility] is always on call at the hospital. [address], [place], [place], [place]  [address] ? [address], [place], [place]   [address] [phone] ? [phone] ? FAX [phone] Web site: [email or web address]      General Anesthesia, Care After Refer to this sheet in the next few weeks. These instructions provide you with information on caring for yourself after your procedure. Your health care provider may also give you more specific instructions. Your treatment has been planned according to current medical practices, but  problems sometimes occur. Call your health care provider if you have any problems or questions after your procedure. WHAT TO EXPECT AFTER THE PROCEDURE After the procedure, it is typical to experience:  Sleepiness.  Nausea and vomiting. HOME CARE INSTRUCTIONS  For the first 24 hours after general anesthesia:  Have a  responsible person with you.  Do not drive a car. If you are alone, do not take public transportation.  Do not drink alcohol.  Do not take medicine that has not been prescribed by your health care provider.  Do not sign important papers or make important decisions.  You may resume a normal diet and activities as directed by your health care provider.  Change bandages (dressings) as directed.  If you have questions or problems that seem related to general anesthesia, call the hospital and ask for the anesthetist or anesthesiologist on call. SEEK MEDICAL CARE IF:  You have nausea and vomiting that continue the day after anesthesia.  You develop a rash. SEEK IMMEDIATE MEDICAL CARE IF:   You have difficulty breathing.  You have chest pain.  You have any allergic problems. Document Released: 12/04/2000 Document Revised: 09/02/2013 Document Reviewed: 03/13/2013 [facility] Patient Information 2015 [place], [place]. This information is not intended to replace advice given to you by your health care provider. Make sure you discuss any questions you have with your health care provider.

## 2014-11-04 ENCOUNTER — Encounter (HOSPITAL_COMMUNITY): Payer: Self-pay | Admitting: General Surgery

## 2014-11-16 ENCOUNTER — Encounter (INDEPENDENT_AMBULATORY_CARE_PROVIDER_SITE_OTHER): Payer: Self-pay | Admitting: *Deleted

## 2014-11-25 ENCOUNTER — Ambulatory Visit (INDEPENDENT_AMBULATORY_CARE_PROVIDER_SITE_OTHER): Payer: 59 | Admitting: Internal Medicine

## 2014-11-25 ENCOUNTER — Encounter (INDEPENDENT_AMBULATORY_CARE_PROVIDER_SITE_OTHER): Payer: Self-pay | Admitting: Internal Medicine

## 2014-11-25 VITALS — BP 108/60 | HR 74 | Temp 97.5°F | Ht 68.5 in | Wt 158.5 lb

## 2014-11-25 DIAGNOSIS — K5909 Other constipation: Secondary | ICD-10-CM

## 2014-11-25 DIAGNOSIS — Z800 Family history of malignant neoplasm of digestive organs: Secondary | ICD-10-CM

## 2014-11-25 DIAGNOSIS — E039 Hypothyroidism, unspecified: Secondary | ICD-10-CM | POA: Insufficient documentation

## 2014-11-25 NOTE — Progress Notes (Addendum)
   Subjective:    Patient ID: Kimberly Reyes, female    DOB: 17-Aug-1956, 59 y.o.   MRN: [id number]  HPI Presents today with c/o constipation. She says her stools are small and she has to push. Stools are very hard. She has a BM once a week. She has had constipation for greater than 30 yrs but has progressively worsened. She has tried Miralax which did not help. She will Ex-Lax if it has been over a week She occasionally has nausea. Her appetite is good. No weight loss. Sometimes she has nausea after she eats. No melena or BRRB.  No abdominal pain. Recently underwent a cholecystectomy in February.     11/03/2014 Cholecystectomy for symptomatic cholelithiasis   03/27/2013: Procedure: Colonoscopy Indications: Patient is a 59 year old Caucasian female who is undergoing high risk screening colonoscopy. Family history significant for colon carcinoma in her father at age 36. He lived to be 29. Impression:  Normal terminal ileum. Normal colonoscopy.  Review of Systems    Married.  Objective:   Physical Exam Blood pressure 108/60, pulse 74, temperature 97.5 F (36.4 C), height 5' 8.5" (1.74 m), weight 158 lb 8 oz (71.895 kg).  Alert and oriented. Skin warm and dry. Oral mucosa is moist.   . Sclera anicteric, conjunctivae is pink. Thyroid not enlarged. No cervical lymphadenopathy. Lungs clear. Heart regular rate and rhythm.  Abdomen is soft. Bowel sounds are positive. No hepatomegaly. No abdominal masses felt. No tenderness.  No edema to lower extremities.         Assessment & Plan:  Constipation chronic. Needs to increase fiber. Am going to try her on Amitiza 74mcg daily

## 2014-11-25 NOTE — Patient Instructions (Addendum)
Amitiza daily OV as needed

## 2014-12-18 ENCOUNTER — Telehealth (INDEPENDENT_AMBULATORY_CARE_PROVIDER_SITE_OTHER): Payer: Self-pay | Admitting: *Deleted

## 2014-12-18 NOTE — Telephone Encounter (Addendum)
Kimberly Reyes was seen by Deberah Castle, NP on 11/25/14. She was given samples of Amitiza 24 to try. They worked okay and is needing a Rx sent to [facility] in [place]. The notes state Linzess but when patient was questioned, Kimberly Reyes said she was told the office was out of the [place] samples. This is why she was given the Amitiza. The return phone number is [phone]. Kimberly Reyes is currently out of medicine.

## 2014-12-18 NOTE — Telephone Encounter (Signed)
Forwarded to Terri to advise upon her return on 12/22/14.

## 2014-12-22 ENCOUNTER — Telehealth (INDEPENDENT_AMBULATORY_CARE_PROVIDER_SITE_OTHER): Payer: Self-pay | Admitting: Internal Medicine

## 2014-12-22 MED ORDER — LUBIPROSTONE 24 MCG PO CAPS: 24.0000 ug | ORAL_CAPSULE | Freq: Two times a day (BID) | ORAL | Status: DC

## 2014-12-22 MED ORDER — LUBIPROSTONE 24 MCG PO CAPS: 24.0000 ug | ORAL_CAPSULE | Freq: Every day | ORAL | Status: DC

## 2014-12-22 NOTE — Telephone Encounter (Signed)
Rx for Amitiza sent to her pharmacy 

## 2014-12-22 NOTE — Telephone Encounter (Signed)
Rx for Amitiza sent to pharmacy

## 2014-12-23 ENCOUNTER — Telehealth (INDEPENDENT_AMBULATORY_CARE_PROVIDER_SITE_OTHER): Payer: Self-pay | Admitting: Internal Medicine

## 2014-12-23 MED ORDER — LUBIPROSTONE 24 MCG PO CAPS: 24.0000 ug | ORAL_CAPSULE | Freq: Every day | ORAL | Status: DC

## 2014-12-23 NOTE — Telephone Encounter (Signed)
Amitiza refilled

## 2014-12-29 ENCOUNTER — Telehealth (INDEPENDENT_AMBULATORY_CARE_PROVIDER_SITE_OTHER): Payer: Self-pay | Admitting: *Deleted

## 2014-12-29 DIAGNOSIS — K5909 Other constipation: Secondary | ICD-10-CM

## 2014-12-29 NOTE — Telephone Encounter (Signed)
Kimberly Reyes called on 12/28/14 stating her insurance would not pay for the Amitiza and it would cost her $185. Returned the call and LM asking Kimberly Reyes to please call her insurance company and get a list of Medication in the same family as the Amitiza for the provider to review. 12/29/14-She did call her insuance but was very adgatated when leaving her second message. The insurance company said they would cover Lactulose and Constulose. "If this is a laxative, I do not want that." "Someone needs to talk with her." The return phone number is [phone].

## 2014-12-30 MED ORDER — LINACLOTIDE 145 MCG PO CAPS: 145.0000 ug | ORAL_CAPSULE | Freq: Every day | ORAL | Status: DC

## 2014-12-30 NOTE — Telephone Encounter (Signed)
Patient states that her Insurance told her that they would not pay for Amitiza. She is concerned about the Lactulose, and it being a laxative. She also states that she has tried in the past Miralax, currently is taking Colace daily and Fiber twice a day.  She states that at the time if her OV , Karna Christmas was going to give her samples of Linzess but our OV was out. Indicates that she would like to try this.  Forwarded to Terri to address.

## 2014-12-30 NOTE — Telephone Encounter (Signed)
Rx for Linzess sent to her pharmacy. If it to expensive, she will not take.

## 2015-01-13 ENCOUNTER — Telehealth (INDEPENDENT_AMBULATORY_CARE_PROVIDER_SITE_OTHER): Payer: Self-pay | Admitting: *Deleted

## 2015-01-13 NOTE — Telephone Encounter (Signed)
The Amitiza Rx was sent to [facility]. Would like to see if Terri would please send it to [facility] for a 3 month supply/90 days. This will be cheaper for her.

## 2015-01-14 ENCOUNTER — Other Ambulatory Visit (INDEPENDENT_AMBULATORY_CARE_PROVIDER_SITE_OTHER): Payer: Self-pay | Admitting: Internal Medicine

## 2015-01-14 MED ORDER — LUBIPROSTONE 24 MCG PO CAPS: 24.0000 ug | ORAL_CAPSULE | Freq: Every day | ORAL | Status: DC

## 2015-01-14 NOTE — Telephone Encounter (Signed)
Amitiza 90 day supply sent to her pharmacy

## 2015-01-14 NOTE — Telephone Encounter (Signed)
Rx sent to her pharmacy 

## 2015-12-21 ENCOUNTER — Other Ambulatory Visit: Payer: Self-pay | Admitting: Endocrinology

## 2015-12-21 DIAGNOSIS — E010 Iodine-deficiency related diffuse (endemic) goiter: Secondary | ICD-10-CM

## 2015-12-29 ENCOUNTER — Ambulatory Visit
Admission: RE | Admit: 2015-12-29 | Discharge: 2015-12-29 | Disposition: A | Discharge status: Home / Self Care | Payer: 59 | Source: Ambulatory Visit | Attending: Endocrinology | Admitting: Endocrinology

## 2015-12-29 DIAGNOSIS — E010 Iodine-deficiency related diffuse (endemic) goiter: Secondary | ICD-10-CM

## 2016-01-04 ENCOUNTER — Other Ambulatory Visit: Payer: Self-pay | Admitting: Endocrinology

## 2016-01-04 DIAGNOSIS — E041 Nontoxic single thyroid nodule: Secondary | ICD-10-CM

## 2016-01-21 ENCOUNTER — Ambulatory Visit
Admission: RE | Admit: 2016-01-21 | Discharge: 2016-01-21 | Disposition: A | Discharge status: Home / Self Care | Payer: 59 | Source: Ambulatory Visit | Attending: Endocrinology | Admitting: Endocrinology

## 2016-01-21 ENCOUNTER — Other Ambulatory Visit (HOSPITAL_COMMUNITY)
Admission: RE | Admit: 2016-01-21 | Discharge: 2016-01-21 | Disposition: A | Discharge status: Home / Self Care | Payer: 59 | Source: Ambulatory Visit | Attending: Diagnostic Radiology | Admitting: Diagnostic Radiology

## 2016-01-21 DIAGNOSIS — E042 Nontoxic multinodular goiter: Secondary | ICD-10-CM | POA: Insufficient documentation

## 2016-01-21 DIAGNOSIS — E041 Nontoxic single thyroid nodule: Secondary | ICD-10-CM

## 2016-04-08 ENCOUNTER — Other Ambulatory Visit (INDEPENDENT_AMBULATORY_CARE_PROVIDER_SITE_OTHER): Payer: Self-pay | Admitting: Internal Medicine

## 2016-09-19 ENCOUNTER — Other Ambulatory Visit (HOSPITAL_COMMUNITY): Payer: Self-pay | Admitting: Orthopedic Surgery

## 2016-09-19 DIAGNOSIS — G8929 Other chronic pain: Secondary | ICD-10-CM

## 2016-09-19 DIAGNOSIS — M1711 Unilateral primary osteoarthritis, right knee: Secondary | ICD-10-CM | POA: Diagnosis not present

## 2016-09-19 DIAGNOSIS — M25561 Pain in right knee: Principal | ICD-10-CM

## 2016-09-25 ENCOUNTER — Ambulatory Visit (HOSPITAL_COMMUNITY)
Admission: RE | Admit: 2016-09-25 | Discharge: 2016-09-25 | Disposition: A | Discharge status: Home / Self Care | Payer: 59 | Source: Ambulatory Visit | Attending: Orthopedic Surgery | Admitting: Orthopedic Surgery

## 2016-09-25 DIAGNOSIS — S83281A Other tear of lateral meniscus, current injury, right knee, initial encounter: Secondary | ICD-10-CM | POA: Diagnosis not present

## 2016-09-25 DIAGNOSIS — X58XXXA Exposure to other specified factors, initial encounter: Secondary | ICD-10-CM | POA: Diagnosis not present

## 2016-09-25 DIAGNOSIS — M179 Osteoarthritis of knee, unspecified: Secondary | ICD-10-CM | POA: Diagnosis not present

## 2016-09-25 DIAGNOSIS — M25561 Pain in right knee: Secondary | ICD-10-CM | POA: Diagnosis not present

## 2016-09-25 DIAGNOSIS — G8929 Other chronic pain: Secondary | ICD-10-CM

## 2016-09-26 ENCOUNTER — Ambulatory Visit (HOSPITAL_COMMUNITY): Payer: 59

## 2016-10-03 DIAGNOSIS — M1711 Unilateral primary osteoarthritis, right knee: Secondary | ICD-10-CM | POA: Diagnosis not present

## 2016-10-16 ENCOUNTER — Encounter (HOSPITAL_COMMUNITY): Payer: Self-pay | Admitting: *Deleted

## 2016-10-16 ENCOUNTER — Emergency Department (HOSPITAL_COMMUNITY): Payer: 59

## 2016-10-16 ENCOUNTER — Emergency Department (HOSPITAL_COMMUNITY)
Admission: EM | Admit: 2016-10-16 | Discharge: 2016-10-16 | Disposition: A | Discharge status: Home / Self Care | Payer: 59 | Attending: Emergency Medicine | Admitting: Emergency Medicine

## 2016-10-16 DIAGNOSIS — R0789 Other chest pain: Secondary | ICD-10-CM

## 2016-10-16 DIAGNOSIS — R945 Abnormal results of liver function studies: Secondary | ICD-10-CM | POA: Diagnosis not present

## 2016-10-16 DIAGNOSIS — R7989 Other specified abnormal findings of blood chemistry: Secondary | ICD-10-CM | POA: Diagnosis not present

## 2016-10-16 DIAGNOSIS — R1012 Left upper quadrant pain: Secondary | ICD-10-CM

## 2016-10-16 DIAGNOSIS — R109 Unspecified abdominal pain: Secondary | ICD-10-CM | POA: Diagnosis not present

## 2016-10-16 DIAGNOSIS — J45909 Unspecified asthma, uncomplicated: Secondary | ICD-10-CM | POA: Diagnosis not present

## 2016-10-16 DIAGNOSIS — E039 Hypothyroidism, unspecified: Secondary | ICD-10-CM | POA: Insufficient documentation

## 2016-10-16 DIAGNOSIS — Z79899 Other long term (current) drug therapy: Secondary | ICD-10-CM | POA: Insufficient documentation

## 2016-10-16 DIAGNOSIS — Z7982 Long term (current) use of aspirin: Secondary | ICD-10-CM | POA: Insufficient documentation

## 2016-10-16 LAB — COMPREHENSIVE METABOLIC PANEL
ALBUMIN: 3.4 g/dL — AB (ref 3.5–5.0)
ALK PHOS: 165 U/L — AB (ref 38–126)
ALT: 354 U/L — ABNORMAL HIGH (ref 14–54)
ANION GAP: 7 (ref 5–15)
AST: 226 U/L — ABNORMAL HIGH (ref 15–41)
BILIRUBIN TOTAL: 0.5 mg/dL (ref 0.3–1.2)
BUN: 17 mg/dL (ref 6–20)
CO2: 27 mmol/L (ref 22–32)
Calcium: 8.8 mg/dL — ABNORMAL LOW (ref 8.9–10.3)
Chloride: 100 mmol/L — ABNORMAL LOW (ref 101–111)
Creatinine, Ser: 0.98 mg/dL (ref 0.44–1.00)
GLUCOSE: 142 mg/dL — AB (ref 65–99)
POTASSIUM: 3.6 mmol/L (ref 3.5–5.1)
Sodium: 134 mmol/L — ABNORMAL LOW (ref 135–145)
TOTAL PROTEIN: 6.6 g/dL (ref 6.5–8.1)

## 2016-10-16 LAB — CBC
HEMATOCRIT: 34 % — AB (ref 36.0–46.0)
HEMOGLOBIN: 11.2 g/dL — AB (ref 12.0–15.0)
MCH: 28.8 pg (ref 26.0–34.0)
MCHC: 32.9 g/dL (ref 30.0–36.0)
MCV: 87.4 fL (ref 78.0–100.0)
Platelets: 250 10*3/uL (ref 150–400)
RBC: 3.89 MIL/uL (ref 3.87–5.11)
RDW: 14.8 % (ref 11.5–15.5)
WBC: 7.8 10*3/uL (ref 4.0–10.5)

## 2016-10-16 LAB — URINALYSIS, ROUTINE W REFLEX MICROSCOPIC
BILIRUBIN URINE: NEGATIVE
Glucose, UA: NEGATIVE mg/dL
Hgb urine dipstick: NEGATIVE
KETONES UR: NEGATIVE mg/dL
Leukocytes, UA: NEGATIVE
NITRITE: NEGATIVE
Protein, ur: NEGATIVE mg/dL
SPECIFIC GRAVITY, URINE: 1.011 (ref 1.005–1.030)
pH: 6 (ref 5.0–8.0)

## 2016-10-16 LAB — LIPASE, BLOOD: Lipase: 28 U/L (ref 11–51)

## 2016-10-16 LAB — D-DIMER, QUANTITATIVE (NOT AT ARMC): D DIMER QUANT: 0.46 ug{FEU}/mL (ref 0.00–0.50)

## 2016-10-16 MED ORDER — TRAMADOL HCL 50 MG PO TABS: 100.0000 mg | ORAL_TABLET | Freq: Four times a day (QID) | ORAL | 0 refills | Status: DC | PRN

## 2016-10-16 MED ORDER — KETOROLAC TROMETHAMINE 30 MG/ML IJ SOLN
30.0000 mg | Freq: Once | INTRAMUSCULAR | Status: AC
  Administered 2016-10-16: 30 mg via INTRAVENOUS
  Filled 2016-10-16: qty 1

## 2016-10-16 MED ORDER — IOPAMIDOL (ISOVUE-300) INJECTION 61%
100.0000 mL | Freq: Once | INTRAVENOUS | Status: AC | PRN
  Administered 2016-10-16: 100 mL via INTRAVENOUS

## 2016-10-16 MED ORDER — NAPROXEN 500 MG PO TABS: ORAL_TABLET | ORAL | 0 refills | Status: DC

## 2016-10-16 MED ORDER — FENTANYL CITRATE (PF) 100 MCG/2ML IJ SOLN
50.0000 ug | Freq: Once | INTRAMUSCULAR | Status: AC
  Administered 2016-10-16: 50 ug via INTRAVENOUS
  Filled 2016-10-16: qty 2

## 2016-10-16 NOTE — ED Notes (Signed)
Pt to xray

## 2016-10-16 NOTE — ED Triage Notes (Signed)
Pt c/o left upper abdominal pain x 1 week, intermittent pain; pt denies any n/v/d or urinary sx

## 2016-10-16 NOTE — ED Provider Notes (Signed)
[facility] DEPT Provider Note   CSN: [id number] Arrival date & time: 10/16/16  0058  Time seen 01:40 AM   History   Chief Complaint Chief Complaint  Patient presents with  . Abdominal Pain    HPI Kimberly Reyes is a 61 y.o. female.  HPI  she reports one week ago she started having intermittent pain in her left upper abdomen. She states initially felt like something rough was rubbing together. Today however the pain started getting constant. She states sometimes it feels like knife shooting through the area. She states it hurts when she touches it, with any movement, or deep breathing. She does not associate the pain with eating foods. She denies cough, shortness of breath, diaphoresis, hematuria. She states she has had nausea without vomiting. Ever had this pain before. She denies any change in her activity or any injury. She does report a lot of stresses because her son was traveling overseas and had some airplane problems.  PCP Purvis Kilts, MD   Past Medical History:  Diagnosis Date  . Anxiety   . Asthma   40 yrs ago   not current probem no inhaler use  . Headache(784.0)   . Hypothyroidism     Patient Active Problem List   Diagnosis Date Noted  . Hypothyroid 11/25/2014  . Hypothyroidism 11/25/2014    Past Surgical History:  Procedure Laterality Date  . ABDOMINAL HYSTERECTOMY     20 years ago, complete  . CHOLECYSTECTOMY N/A 11/03/2014   Procedure: LAPAROSCOPIC CHOLECYSTECTOMY WITH INTRAOPERATIVE CHOLANGIOGRAM;  Surgeon: Gayland Curry, MD;  Location: [facility];  Service: General;  Laterality: N/A;  . COLONOSCOPY N/A 03/27/2013   Procedure: COLONOSCOPY;  Surgeon: Rogene Houston, MD;  Location[facility];  Service: Endoscopy;  Laterality: N/A;  1030  . TUBAL LIGATION      OB History    No data available       Home Medications    Prior to Admission medications   Medication Sig Start Date End Date Taking? Authorizing Provider  aspirin EC 81 MG  tablet Take 81 mg by mouth at bedtime.    Yes Historical Provider, MD  cyclobenzaprine (FLEXERIL) 10 MG tablet Take 10 mg by mouth 3 (three) times daily as needed for muscle spasms.   Yes Historical Provider, MD  cycloSPORINE (RESTASIS) 0.05 % ophthalmic emulsion Place 1 drop into both eyes at bedtime.   Yes Historical Provider, MD  eletriptan (RELPAX) 40 MG tablet Take 40 mg by mouth as needed for migraine. One tablet by mouth at onset of headache. May repeat in 2 hours if headache persists or recurs.   Yes Historical Provider, MD  HYDROcodone-acetaminophen (NORCO) 5-325 MG per tablet Take 1-2 tablets by mouth every 6 (six) hours as needed for moderate pain. 11/03/14  Yes Greer Pickerel, MD  levothyroxine (SYNTHROID, LEVOTHROID) 75 MCG tablet Take 75 mcg by mouth daily before breakfast.   Yes Historical Provider, MD  naproxen (NAPROSYN) 500 MG tablet Take 1 po BID with food prn pain 10/16/16   Rolland Porter, MD  traMADol (ULTRAM) 50 MG tablet Take 2 tablets (100 mg total) by mouth every 6 (six) hours as needed. 10/16/16   Rolland Porter, MD    Family History Family History  Problem Relation Age of Onset  . Colon cancer Father     Social History Social History  Substance Use Topics  . Smoking status: Never Smoker  . Smokeless tobacco: Never Used  . Alcohol use Yes  Comment: very rare  lives with spouse employed  Allergies   Patient has no known allergies.   Review of Systems Review of Systems  All other systems reviewed and are negative.    Physical Exam Updated Vital Signs BP 126/80 (BP Location: Right Arm)   Temp 97.8 F (36.6 C) (Oral)   Ht 5' 8.5" (1.74 m)   Wt 160 lb (72.6 kg)   BMI 23.97 kg/m   Vital signs normal    Physical Exam  Constitutional: She is oriented to person, place, and time. She appears well-developed and well-nourished.  Non-toxic appearance. She does not appear ill. No distress.  HENT:  Head: Normocephalic and atraumatic.  Right Ear: External ear normal.    Left Ear: External ear normal.  Nose: Nose normal. No mucosal edema or rhinorrhea.  Mouth/Throat: Oropharynx is clear and moist and mucous membranes are normal. No dental abscesses or uvula swelling.  Eyes: Conjunctivae and EOM are normal. Pupils are equal, round, and reactive to light.  Neck: Normal range of motion and full passive range of motion without pain. Neck supple.  Cardiovascular: Normal rate, regular rhythm and normal heart sounds.  Exam reveals no gallop and no friction rub.   No murmur heard. Pulmonary/Chest: Effort normal and breath sounds normal. No respiratory distress. She has no wheezes. She has no rhonchi. She has no rales. She exhibits no tenderness and no crepitus.    Tender in her lateral left chest wall without crepitance  Abdominal: Soft. Normal appearance and bowel sounds are normal. She exhibits no distension. There is no tenderness. There is no rebound and no guarding.    Tender in her lateral left abdomen  Musculoskeletal: Normal range of motion. She exhibits no edema or tenderness.  Moves all extremities well.   Neurological: She is alert and oriented to person, place, and time. She has normal strength. No cranial nerve deficit.  Skin: Skin is warm, dry and intact. No rash noted. No erythema. No pallor.  Psychiatric: She has a normal mood and affect. Her speech is normal and behavior is normal. Her mood appears not anxious.  Nursing note and vitals reviewed.    ED Treatments / Results  Labs (all labs ordered are listed, but only abnormal results are displayed)  Results for orders placed or performed during the hospital encounter of 10/16/16  Lipase, blood  Result Value Ref Range   Lipase 28 11 - 51 U/L  Comprehensive metabolic panel  Result Value Ref Range   Sodium 134 (L) 135 - 145 mmol/L   Potassium 3.6 3.5 - 5.1 mmol/L   Chloride 100 (L) 101 - 111 mmol/L   CO2 27 22 - 32 mmol/L   Glucose, Bld 142 (H) 65 - 99 mg/dL   BUN 17 6 - 20 mg/dL    Creatinine, Ser 0.98 0.44 - 1.00 mg/dL   Calcium 8.8 (L) 8.9 - 10.3 mg/dL   Total Protein 6.6 6.5 - 8.1 g/dL   Albumin 3.4 (L) 3.5 - 5.0 g/dL   AST 226 (H) 15 - 41 U/L   ALT 354 (H) 14 - 54 U/L   Alkaline Phosphatase 165 (H) 38 - 126 U/L   Total Bilirubin 0.5 0.3 - 1.2 mg/dL   GFR calc non Af Amer >60 >60 mL/min   GFR calc Af Amer >60 >60 mL/min   Anion gap 7 5 - 15  CBC  Result Value Ref Range   WBC 7.8 4.0 - 10.5 K/uL   RBC 3.89 3.87 -  5.11 MIL/uL   Hemoglobin 11.2 (L) 12.0 - 15.0 g/dL   HCT 34.0 (L) 36.0 - 46.0 %   MCV 87.4 78.0 - 100.0 fL   MCH 28.8 26.0 - 34.0 pg   MCHC 32.9 30.0 - 36.0 g/dL   RDW 14.8 11.5 - 15.5 %   Platelets 250 150 - 400 K/uL  Urinalysis, Routine w reflex microscopic  Result Value Ref Range   Color, Urine YELLOW YELLOW   APPearance CLEAR CLEAR   Specific Gravity, Urine 1.011 1.005 - 1.030   pH 6.0 5.0 - 8.0   Glucose, UA NEGATIVE NEGATIVE mg/dL   Hgb urine dipstick NEGATIVE NEGATIVE   Bilirubin Urine NEGATIVE NEGATIVE   Ketones, ur NEGATIVE NEGATIVE mg/dL   Protein, ur NEGATIVE NEGATIVE mg/dL   Nitrite NEGATIVE NEGATIVE   Leukocytes, UA NEGATIVE NEGATIVE  D-dimer, quantitative  Result Value Ref Range   D-Dimer, Quant 0.46 0.00 - 0.50 ug/mL-FEU    Laboratory interpretation all normal except for elevation of LFT's   EKG  EKG Interpretation None       Radiology Ct Abdomen Pelvis W Contrast  Result Date: 10/16/2016 CLINICAL DATA:  Left upper quadrant abdominal pain for 1 week EXAM: CT ABDOMEN AND PELVIS WITH CONTRAST TECHNIQUE: Multidetector CT imaging of the abdomen and pelvis was performed using the standard protocol following bolus administration of intravenous contrast. CONTRAST:  158mL ISOVUE-300 IOPAMIDOL (ISOVUE-300) INJECTION 61% COMPARISON:  None. FINDINGS: Lower chest: Lung bases are clear.  Heart size is normal. Hepatobiliary: Mild hepatic steatosis. Surgical clips in the gallbladder fossa. No biliary dilatation Pancreas:  Unremarkable. No pancreatic ductal dilatation or surrounding inflammatory changes. Spleen: Calcified granuloma.  Spleen otherwise unremarkable. Adrenals/Urinary Tract: Adrenal glands are unremarkable. Kidneys are normal, without renal calculi, focal lesion, or hydronephrosis. Bladder is unremarkable. Stomach/Bowel: Stomach is within normal limits. Appendix appears normal. No evidence of bowel wall thickening, distention, or inflammatory changes. Vascular/Lymphatic: No significant vascular findings are present. No enlarged abdominal or pelvic lymph nodes. Reproductive: Status post hysterectomy. No adnexal masses. Other: No abdominal wall hernia or abnormality. No abdominopelvic ascites. Musculoskeletal: No acute or significant osseous findings. IMPRESSION: No CT evidence for acute intra-abdominal or pelvic pathology. Mild hepatic steatosis Electronically Signed   By: Donavan Foil M.D.   On: 10/16/2016 02:29    Procedures Procedures (including critical care time)  Medications Ordered in ED Medications  ketorolac (TORADOL) 30 MG/ML injection 30 mg (30 mg Intravenous Given 10/16/16 0207)  iopamidol (ISOVUE-300) 61 % injection 100 mL (100 mLs Intravenous Contrast Given 10/16/16 0211)  fentaNYL (SUBLIMAZE) injection 50 mcg (50 mcg Intravenous Given 10/16/16 0347)     Initial Impression / Assessment and Plan / ED Course  I have reviewed the triage vital signs and the nursing notes.  Pertinent labs & imaging results that were available during my care of the patient were reviewed by me and considered in my medical decision making (see chart for details).  Patient was given IV Toradol for her chest pain which appears to be chest wall pain. CT scan the abdomen was done to evaluate her abdominal discomfort.  We discussed her test results including her see the scanned. She was still having some discomfort, she was given IV fentanyl for pain. She was scheduled for an outpatient ultrasound of the right upper  quadrant to look for retained gallstone. We discussed her medications, she is not on any cholesterol medications or taking Tylenol or drinking alcohol to explain her elevated LFTs. Her lipase was normal and her  CT did not show any acute inflammation around the pancreas.  Final Clinical Impressions(s) / ED Diagnoses   Final diagnoses:  Left-sided chest wall pain  LUQ pain  Elevated liver function tests    New Prescriptions Discharge Medication List as of 10/16/2016  5:07 AM    START taking these medications   Details  naproxen (NAPROSYN) 500 MG tablet Take 1 po BID with food prn pain, Print    traMADol (ULTRAM) 50 MG tablet Take 2 tablets (100 mg total) by mouth every 6 (six) hours as needed., Starting Mon 10/16/2016, Print        Plan discharge  Rolland Porter, MD, Barbette Or, MD 10/16/16 [phone]

## 2016-10-16 NOTE — Discharge Instructions (Addendum)
You have an outpatient ultrasound to look for a retained gallbladder stone tomorrow, Feb 6 at 1:30 PM. Do not eat or drink for 6 hours before your test, arrive 15 minutes early and go to Radiology to get registered for the test.  Follow up with Dr Hilma Favors about your elevated liver tests. Return to the ED if you get a fever, vomiting, worsening pain.

## 2016-10-17 ENCOUNTER — Ambulatory Visit (HOSPITAL_COMMUNITY)
Admission: RE | Admit: 2016-10-17 | Discharge: 2016-10-17 | Disposition: A | Discharge status: Home / Self Care | Payer: 59 | Source: Ambulatory Visit | Attending: Emergency Medicine | Admitting: Emergency Medicine

## 2016-10-17 DIAGNOSIS — R945 Abnormal results of liver function studies: Secondary | ICD-10-CM | POA: Diagnosis not present

## 2016-10-17 DIAGNOSIS — Z9049 Acquired absence of other specified parts of digestive tract: Secondary | ICD-10-CM | POA: Diagnosis not present

## 2016-10-17 DIAGNOSIS — R935 Abnormal findings on diagnostic imaging of other abdominal regions, including retroperitoneum: Secondary | ICD-10-CM | POA: Diagnosis not present

## 2016-11-08 DIAGNOSIS — Z1389 Encounter for screening for other disorder: Secondary | ICD-10-CM | POA: Diagnosis not present

## 2016-11-08 DIAGNOSIS — R1012 Left upper quadrant pain: Secondary | ICD-10-CM | POA: Diagnosis not present

## 2016-11-08 DIAGNOSIS — R945 Abnormal results of liver function studies: Secondary | ICD-10-CM | POA: Diagnosis not present

## 2016-12-01 DIAGNOSIS — M23211 Derangement of anterior horn of medial meniscus due to old tear or injury, right knee: Secondary | ICD-10-CM | POA: Diagnosis not present

## 2016-12-01 DIAGNOSIS — G8918 Other acute postprocedural pain: Secondary | ICD-10-CM | POA: Diagnosis not present

## 2016-12-01 DIAGNOSIS — M23261 Derangement of other lateral meniscus due to old tear or injury, right knee: Secondary | ICD-10-CM | POA: Diagnosis not present

## 2016-12-01 DIAGNOSIS — S83241A Other tear of medial meniscus, current injury, right knee, initial encounter: Secondary | ICD-10-CM | POA: Diagnosis not present

## 2016-12-01 DIAGNOSIS — S83281A Other tear of lateral meniscus, current injury, right knee, initial encounter: Secondary | ICD-10-CM | POA: Diagnosis not present

## 2016-12-01 DIAGNOSIS — M94261 Chondromalacia, right knee: Secondary | ICD-10-CM | POA: Diagnosis not present

## 2016-12-14 DIAGNOSIS — E039 Hypothyroidism, unspecified: Secondary | ICD-10-CM | POA: Diagnosis not present

## 2016-12-21 DIAGNOSIS — E039 Hypothyroidism, unspecified: Secondary | ICD-10-CM | POA: Diagnosis not present

## 2017-01-08 ENCOUNTER — Encounter (INDEPENDENT_AMBULATORY_CARE_PROVIDER_SITE_OTHER): Payer: Self-pay | Admitting: Internal Medicine

## 2017-01-08 ENCOUNTER — Encounter (INDEPENDENT_AMBULATORY_CARE_PROVIDER_SITE_OTHER): Payer: Self-pay

## 2017-01-30 DIAGNOSIS — M25561 Pain in right knee: Secondary | ICD-10-CM | POA: Diagnosis not present

## 2017-01-31 ENCOUNTER — Ambulatory Visit (INDEPENDENT_AMBULATORY_CARE_PROVIDER_SITE_OTHER): Payer: 59 | Admitting: Internal Medicine

## 2017-02-01 DIAGNOSIS — E039 Hypothyroidism, unspecified: Secondary | ICD-10-CM | POA: Diagnosis not present

## 2017-02-06 DIAGNOSIS — R609 Edema, unspecified: Secondary | ICD-10-CM | POA: Diagnosis not present

## 2017-02-06 DIAGNOSIS — R072 Precordial pain: Secondary | ICD-10-CM | POA: Diagnosis not present

## 2017-02-06 DIAGNOSIS — Z7982 Long term (current) use of aspirin: Secondary | ICD-10-CM | POA: Insufficient documentation

## 2017-02-06 DIAGNOSIS — R600 Localized edema: Secondary | ICD-10-CM | POA: Insufficient documentation

## 2017-02-06 DIAGNOSIS — E039 Hypothyroidism, unspecified: Secondary | ICD-10-CM | POA: Insufficient documentation

## 2017-02-06 DIAGNOSIS — J45909 Unspecified asthma, uncomplicated: Secondary | ICD-10-CM | POA: Insufficient documentation

## 2017-02-06 DIAGNOSIS — Z79899 Other long term (current) drug therapy: Secondary | ICD-10-CM | POA: Insufficient documentation

## 2017-02-06 DIAGNOSIS — M7989 Other specified soft tissue disorders: Secondary | ICD-10-CM | POA: Diagnosis present

## 2017-02-06 DIAGNOSIS — R079 Chest pain, unspecified: Secondary | ICD-10-CM | POA: Diagnosis not present

## 2017-02-07 ENCOUNTER — Emergency Department (HOSPITAL_COMMUNITY)
Admission: EM | Admit: 2017-02-07 | Discharge: 2017-02-07 | Disposition: A | Discharge status: Home / Self Care | Payer: 59 | Attending: Emergency Medicine | Admitting: Emergency Medicine

## 2017-02-07 ENCOUNTER — Emergency Department (HOSPITAL_COMMUNITY): Payer: 59

## 2017-02-07 ENCOUNTER — Encounter (HOSPITAL_COMMUNITY): Payer: Self-pay | Admitting: *Deleted

## 2017-02-07 DIAGNOSIS — R079 Chest pain, unspecified: Secondary | ICD-10-CM | POA: Diagnosis not present

## 2017-02-07 DIAGNOSIS — R609 Edema, unspecified: Secondary | ICD-10-CM

## 2017-02-07 DIAGNOSIS — R072 Precordial pain: Secondary | ICD-10-CM

## 2017-02-07 LAB — COMPREHENSIVE METABOLIC PANEL
ALBUMIN: 3.4 g/dL — AB (ref 3.5–5.0)
ALT: 97 U/L — ABNORMAL HIGH (ref 14–54)
AST: 80 U/L — AB (ref 15–41)
Alkaline Phosphatase: 120 U/L (ref 38–126)
Anion gap: 7 (ref 5–15)
BUN: 9 mg/dL (ref 6–20)
CHLORIDE: 101 mmol/L (ref 101–111)
CO2: 28 mmol/L (ref 22–32)
Calcium: 9.1 mg/dL (ref 8.9–10.3)
Creatinine, Ser: 0.95 mg/dL (ref 0.44–1.00)
GFR calc Af Amer: >60 mL/min (ref >60)
GFR calc non Af Amer: >60 mL/min (ref >60)
GLUCOSE: 147 mg/dL — AB (ref 65–99)
POTASSIUM: 3.6 mmol/L (ref 3.5–5.1)
Sodium: 136 mmol/L (ref 135–145)
Total Bilirubin: 0.4 mg/dL (ref 0.3–1.2)
Total Protein: 6.8 g/dL (ref 6.5–8.1)

## 2017-02-07 LAB — CBC WITH DIFFERENTIAL/PLATELET
Basophils Absolute: 0.1 10*3/uL (ref 0.0–0.1)
Basophils Relative: 1 %
EOS PCT: 6 %
Eosinophils Absolute: 0.5 10*3/uL (ref 0.0–0.7)
HCT: 33.3 % — ABNORMAL LOW (ref 36.0–46.0)
Hemoglobin: 10.9 g/dL — ABNORMAL LOW (ref 12.0–15.0)
LYMPHS ABS: 2.2 10*3/uL (ref 0.7–4.0)
LYMPHS PCT: 28 %
MCH: 28.5 pg (ref 26.0–34.0)
MCHC: 32.7 g/dL (ref 30.0–36.0)
MCV: 86.9 fL (ref 78.0–100.0)
MONO ABS: 1.1 10*3/uL — AB (ref 0.1–1.0)
Monocytes Relative: 14 %
Neutro Abs: 4 10*3/uL (ref 1.7–7.7)
Neutrophils Relative %: 51 %
PLATELETS: 200 10*3/uL (ref 150–400)
RBC: 3.83 MIL/uL — AB (ref 3.87–5.11)
RDW: 14.9 % (ref 11.5–15.5)
WBC: 7.8 10*3/uL (ref 4.0–10.5)

## 2017-02-07 LAB — TROPONIN I: Troponin I: <0.03 ng/mL (ref <0.03)

## 2017-02-07 NOTE — ED Notes (Signed)
Patient ambulated around nursing station. Patient's 02 sat 97-100 percent on room air, respirations 30-43, heart rate 108. Patient states that she does feel sob. States that she is ready to go home. EDP made aware.

## 2017-02-07 NOTE — ED Triage Notes (Signed)
Pt c/o swelling to bilateral feet; pt finished a round of steroids just before the swelling started

## 2017-02-07 NOTE — ED Provider Notes (Signed)
[facility] DEPT Provider Note   CSN: [id number] Arrival date & time: 02/06/17  2334  By signing my name below, I, Margit Banda, attest that this documentation has been prepared under the direction and in the presence of Ripley Fraise, MD. Electronically Signed: Margit Banda, ED Scribe. 02/07/17. 12:31 AM.  History   Chief Complaint Chief Complaint  Patient presents with  . Leg Swelling    HPI Kimberly Reyes is a 61 y.o. female who presents to the Emergency Department complaining of bilateral leg swelling that started ~ 1.5 weeks ago. Pt reports finishing a round of steroids just before swelling started that was prescribed for her right knee which she had surgery on, on the 23rd of May. Associated sx include dizziness and abdominal pain. She also notes she has been having intermittent chest pains that will radiate to her left arm. Her last one was this past weekend (>2 days ago).  She recently had a medication change, but she hasn't started the new dosage yet. No hx of stroke, DM, and HTN. Doesn't smoke. No FHx of heart disease. No hx of VTE. Pt denies fever, nausea, vomiting, and SOB.  The history is provided by the patient. No language interpreter was used.    Past Medical History:  Diagnosis Date  . Anxiety   . Asthma   40 yrs ago   not current probem no inhaler use  . Headache(784.0)   . Hypothyroidism     Patient Active Problem List   Diagnosis Date Noted  . Hypothyroid 11/25/2014  . Hypothyroidism 11/25/2014    Past Surgical History:  Procedure Laterality Date  . ABDOMINAL HYSTERECTOMY     20 years ago, complete  . CHOLECYSTECTOMY N/A 11/03/2014   Procedure: LAPAROSCOPIC CHOLECYSTECTOMY WITH INTRAOPERATIVE CHOLANGIOGRAM;  Surgeon: Gayland Curry, MD;  Location: [facility];  Service: General;  Laterality: N/A;  . COLONOSCOPY N/A 03/27/2013   Procedure: COLONOSCOPY;  Surgeon: Rogene Houston, MD;  [facility];  Service: Endoscopy;  Laterality: N/A;   1030  . KNEE ARTHROSCOPY W/ MENISCAL REPAIR Right   . TUBAL LIGATION      OB History    No data available       Home Medications    Prior to Admission medications   Medication Sig Start Date End Date Taking? Authorizing Provider  aspirin EC 81 MG tablet Take 81 mg by mouth at bedtime.     [provider]  cyclobenzaprine (FLEXERIL) 10 MG tablet Take 10 mg by mouth 3 (three) times daily as needed for muscle spasms.    [provider]  cycloSPORINE (RESTASIS) 0.05 % ophthalmic emulsion Place 1 drop into both eyes at bedtime.    [provider]  eletriptan (RELPAX) 40 MG tablet Take 40 mg by mouth as needed for migraine. One tablet by mouth at onset of headache. May repeat in 2 hours if headache persists or recurs.    [provider]  HYDROcodone-acetaminophen (NORCO) 5-325 MG per tablet Take 1-2 tablets by mouth every 6 (six) hours as needed for moderate pain. 11/03/14   Greer Pickerel, MD  levothyroxine (SYNTHROID, LEVOTHROID) 75 MCG tablet Take 88 mcg by mouth daily before breakfast.     [provider]  naproxen (NAPROSYN) 500 MG tablet Take 1 po BID with food prn pain 10/16/16   Rolland Porter, MD  traMADol (ULTRAM) 50 MG tablet Take 2 tablets (100 mg total) by mouth every 6 (six) hours as needed. 10/16/16   Rolland Porter,  MD    Family History Family History  Problem Relation Age of Onset  . Colon cancer Father     Social History Social History  Substance Use Topics  . Smoking status: Never Smoker  . Smokeless tobacco: Never Used  . Alcohol use Yes     Comment: very rare     Allergies   Patient has no known allergies.   Review of Systems Review of Systems  Constitutional: Negative for fever.  Respiratory: Negative for shortness of breath.   Cardiovascular: Positive for chest pain and leg swelling.  Gastrointestinal: Positive for abdominal pain. Negative for nausea and vomiting.  Neurological: Positive for dizziness.  All other  systems reviewed and are negative.    Physical Exam Updated Vital Signs BP (!) 149/89 (BP Location: Right Arm)   Pulse 95   Temp 97.9 F (36.6 C) (Oral)   Resp 18   Ht 5\' 9"  (1.753 m)   Wt 167 lb (75.8 kg)   SpO2 100%   BMI 24.66 kg/m   Physical Exam  CONSTITUTIONAL: Well developed/well nourished HEAD: Normocephalic/atraumatic EYES: EOMI/PERRL ENMT: Mucous membranes moist NECK: supple no meningeal signs, no JVD SPINE/BACK:entire spine nontender CV: S1/S2 noted, no murmurs/rubs/gallops noted LUNGS: Lungs are clear to auscultation bilaterally, no apparent distress ABDOMEN: soft, nontender, no rebound or guarding, bowel sounds noted throughout abdomen GU:no cva tenderness NEURO: Pt is awake/alert/appropriate, moves all extremitiesx4.  No facial droop.   EXTREMITIES: pulses normal/equal, full ROM, bilateral symmetric pitting edema to LE, no erythema, no significant calf tenderness SKIN: warm, color normal PSYCH: no abnormalities of mood noted, alert and oriented to situation   ED Treatments / Results  DIAGNOSTIC STUDIES: Oxygen Saturation is 100% on RA, normal by my interpretation.   COORDINATION OF CARE: 12:31 AM-Discussed next steps with pt. Pt verbalized understanding and is agreeable with the plan.    Labs (all labs ordered are listed, but only abnormal results are displayed) Labs Reviewed  COMPREHENSIVE METABOLIC PANEL - Abnormal; Notable for the following:       Result Value   Glucose, Bld 147 (*)    Albumin 3.4 (*)    AST 80 (*)    ALT 97 (*)    All other components within normal limits  CBC WITH DIFFERENTIAL/PLATELET - Abnormal; Notable for the following:    RBC 3.83 (*)    Hemoglobin 10.9 (*)    HCT 33.3 (*)    Monocytes Absolute 1.1 (*)    All other components within normal limits  TROPONIN I    EKG  EKG Interpretation  Date/Time:  Wednesday Feb 07 2017 00:49:20 EDT Ventricular Rate:  86 PR Interval:    QRS Duration: 88 QT  Interval:  371 QTC Calculation: 444 R Axis:   65 Text Interpretation:  Sinus rhythm Probable inferior infarct, old No previous ECGs available Confirmed by Ripley Fraise [phone]) on 02/07/2017 12:59:44 AM       Radiology Dg Chest 2 View  Result Date: 02/07/2017 CLINICAL DATA:  61 year old female with chest pain. EXAM: CHEST  2 VIEW COMPARISON:  Chest radiograph dated 06/24/2008 FINDINGS: The heart size and mediastinal contours are within normal limits. Both lungs are clear. The visualized skeletal structures are unremarkable. IMPRESSION: No active cardiopulmonary disease. Electronically Signed   By: Anner Crete M.D.   On: 02/07/2017 01:14    Procedures Procedures (including critical care time)  Medications Ordered in ED Medications - No data to display   Initial Impression / Assessment and Plan / ED  Course  I have reviewed the triage vital signs and the nursing notes.  Pertinent labs & imaging results that were available during my care of the patient were reviewed by me and considered in my medical decision making (see chart for details).     2:41 AM Pt well appearing She is here for LE edema No signs of CHF Albumin similar to prior LFTs improving Suspect this could be due to recent thyroid issues as well as course of steroids Pt well appearing Reading a magazine, no distress, she is requesting d/c home She ambulated without hypoxia, but did have mild SOB Advised to increase synthroid per her endocrinologist instructions.  Defer any new meds/lasix for now  We discussed strict ER return precautions    Final Clinical Impressions(s) / ED Diagnoses   Final diagnoses:  Peripheral edema  Precordial pain    New Prescriptions New Prescriptions   No medications on file   I personally performed the services described in this documentation, which was scribed in my presence. The recorded information has been reviewed and is accurate.       Ripley Fraise,  MD 02/07/17 [phone]

## 2017-02-09 DIAGNOSIS — E063 Autoimmune thyroiditis: Secondary | ICD-10-CM | POA: Diagnosis not present

## 2017-02-09 DIAGNOSIS — R600 Localized edema: Secondary | ICD-10-CM | POA: Diagnosis not present

## 2017-02-14 ENCOUNTER — Encounter (INDEPENDENT_AMBULATORY_CARE_PROVIDER_SITE_OTHER): Payer: Self-pay

## 2017-02-14 ENCOUNTER — Encounter (INDEPENDENT_AMBULATORY_CARE_PROVIDER_SITE_OTHER): Payer: Self-pay | Admitting: Internal Medicine

## 2017-02-14 ENCOUNTER — Ambulatory Visit (INDEPENDENT_AMBULATORY_CARE_PROVIDER_SITE_OTHER): Payer: 59 | Admitting: Internal Medicine

## 2017-02-14 VITALS — BP 126/82 | HR 80 | Temp 98.3°F | Ht 69.0 in | Wt 181.0 lb

## 2017-02-14 DIAGNOSIS — R748 Abnormal levels of other serum enzymes: Secondary | ICD-10-CM | POA: Diagnosis not present

## 2017-02-14 LAB — HEPATIC FUNCTION PANEL
ALBUMIN: 3.5 g/dL — AB (ref 3.6–5.1)
ALK PHOS: 131 U/L — AB (ref 33–130)
ALT: 100 U/L — ABNORMAL HIGH (ref 6–29)
AST: 78 U/L — AB (ref 10–35)
BILIRUBIN TOTAL: 0.4 mg/dL (ref 0.2–1.2)
Bilirubin, Direct: 0.1 mg/dL (ref <=0.2)
Indirect Bilirubin: 0.3 mg/dL (ref 0.2–1.2)
Total Protein: 6.4 g/dL (ref 6.1–8.1)

## 2017-02-14 NOTE — Progress Notes (Addendum)
Subjective:    Patient ID: Kimberly Reyes, female    DOB: 1956-01-27, 61 y.o.   MRN: [id number]  HPI Referred by Dr. Hilma Favors for elevated liver enzymes.  Knee arthroscopy in March.  She tells me she is doing okay.  Brother died in 03/01/2023 of pneumonia at age 81. No IV drug abuse. Cholecystectomy 2 yrs ago for N and V. Gallstones were present.  Appetite is good. No weight loss. BMs x 1-2 a week. Takes Amitiza daily for her constipation.   No abdominal pain.   11/09/2016 Hepatitis panel negative.  Monospot negative.    11/08/2016 total bili 0.4. ALP 153, AST 151, ALT 194.   10/17/2016 [place] RUQ: Elevated LFTS post cholecystecomy 2 yrs ago.  CBD 4.9.  MPRESSION: Question very mild fatty infiltration of the liver.  CMP Latest Ref Rng & Units 2017/02/28 10/16/2016 11/02/2014  Glucose 65 - 99 mg/dL 147(H) 142(H) 102(H)  BUN 6 - 20 mg/dL 9 17 20   Creatinine 0.44 - 1.00 mg/dL 0.95 0.98 0.83  Sodium 135 - 145 mmol/L 136 134(L) 138  Potassium 3.5 - 5.1 mmol/L 3.6 3.6 3.9  Chloride 101 - 111 mmol/L 101 100(L) 101  CO2 22 - 32 mmol/L 28 27 29   Calcium 8.9 - 10.3 mg/dL 9.1 8.8(L) 9.5  Total Protein 6.5 - 8.1 g/dL 6.8 6.6 7.6  Total Bilirubin 0.3 - 1.2 mg/dL 0.4 0.5 0.4  Alkaline Phos 38 - 126 U/L 120 165(H) 75  AST 15 - 41 U/L 80(H) 226(H) 34  ALT 14 - 54 U/L 97(H) 354(H) 28     Review of Systems Past Medical History:  Diagnosis Date  . Anxiety   . Asthma   40 yrs ago   not current probem no inhaler use  . Headache(784.0)   . Hypothyroidism     Past Surgical History:  Procedure Laterality Date  . ABDOMINAL HYSTERECTOMY     20 years ago, complete  . CHOLECYSTECTOMY N/A 11/03/2014   Procedure: LAPAROSCOPIC CHOLECYSTECTOMY WITH INTRAOPERATIVE CHOLANGIOGRAM;  Surgeon: Gayland Curry, MD;  Location: [facility];  Service: General;  Laterality: N/A;  . COLONOSCOPY N/A 03/27/2013   Procedure: COLONOSCOPY;  Surgeon: Rogene Houston, MD;  [facility];  Service: Endoscopy;  Laterality:  N/A;  1030  . KNEE ARTHROSCOPY W/ MENISCAL REPAIR Right   . TUBAL LIGATION      No Known Allergies  Current Outpatient Prescriptions on File Prior to Visit  Medication Sig Dispense Refill  . aspirin EC 81 MG tablet Take 81 mg by mouth at bedtime.     . cyclobenzaprine (FLEXERIL) 10 MG tablet Take 10 mg by mouth 3 (three) times daily as needed for muscle spasms.    . cycloSPORINE (RESTASIS) 0.05 % ophthalmic emulsion Place 1 drop into both eyes at bedtime.    Marland Kitchen eletriptan (RELPAX) 40 MG tablet Take 40 mg by mouth as needed for migraine. One tablet by mouth at onset of headache. May repeat in 2 hours if headache persists or recurs.    Marland Kitchen HYDROcodone-acetaminophen (NORCO) 5-325 MG per tablet Take 1-2 tablets by mouth every 6 (six) hours as needed for moderate pain. 40 tablet 0  . levothyroxine (SYNTHROID, LEVOTHROID) 75 MCG tablet Take 88 mcg by mouth daily before breakfast.     . naproxen (NAPROSYN) 500 MG tablet Take 1 po BID with food prn pain 30 tablet 0  . traMADol (ULTRAM) 50 MG tablet Take 2 tablets (100 mg total) by mouth every 6 (six)  hours as needed. 16 tablet 0   No current facility-administered medications on file prior to visit.         Objective:   Physical Exam Blood pressure 126/82, pulse 80, temperature 98.3 F (36.8 C), height 5\' 9"  (1.753 m), weight 181 lb (82.1 kg).  Alert and oriented. Skin warm and dry. Oral mucosa is moist.   . Sclera anicteric, conjunctivae is pink. Thyroid not enlarged. No cervical lymphadenopathy. Lungs clear. Heart regular rate and rhythm.  Abdomen is soft. Bowel sounds are positive. No hepatomegaly. No abdominal masses felt. No tenderness.  1-2+ edema to lower extremities.          Assessment & Plan:  Elevated liver enzymes. Am going to repeat an Hepatic afunction. Will get labs from Dr. Hilma Favors. If pain reoccurs

## 2017-02-14 NOTE — Patient Instructions (Addendum)
Hepatic function.  If you have abdominal pain: please call and I will repeat liver enzymes.

## 2017-02-19 ENCOUNTER — Other Ambulatory Visit (INDEPENDENT_AMBULATORY_CARE_PROVIDER_SITE_OTHER): Payer: Self-pay | Admitting: *Deleted

## 2017-02-19 ENCOUNTER — Encounter (INDEPENDENT_AMBULATORY_CARE_PROVIDER_SITE_OTHER): Payer: Self-pay | Admitting: *Deleted

## 2017-02-19 DIAGNOSIS — R7989 Other specified abnormal findings of blood chemistry: Secondary | ICD-10-CM

## 2017-02-19 DIAGNOSIS — R945 Abnormal results of liver function studies: Principal | ICD-10-CM

## 2017-02-23 DIAGNOSIS — G43709 Chronic migraine without aura, not intractable, without status migrainosus: Secondary | ICD-10-CM | POA: Diagnosis not present

## 2017-02-26 ENCOUNTER — Ambulatory Visit (INDEPENDENT_AMBULATORY_CARE_PROVIDER_SITE_OTHER): Payer: 59 | Admitting: Internal Medicine

## 2017-02-27 DIAGNOSIS — M7989 Other specified soft tissue disorders: Secondary | ICD-10-CM | POA: Diagnosis not present

## 2017-02-27 DIAGNOSIS — R609 Edema, unspecified: Secondary | ICD-10-CM | POA: Diagnosis not present

## 2017-02-27 DIAGNOSIS — Z1389 Encounter for screening for other disorder: Secondary | ICD-10-CM | POA: Diagnosis not present

## 2017-02-28 DIAGNOSIS — M79604 Pain in right leg: Secondary | ICD-10-CM | POA: Diagnosis not present

## 2017-02-28 DIAGNOSIS — M7989 Other specified soft tissue disorders: Secondary | ICD-10-CM | POA: Diagnosis not present

## 2017-03-01 ENCOUNTER — Encounter: Payer: Self-pay | Admitting: Internal Medicine

## 2017-03-01 ENCOUNTER — Ambulatory Visit (INDEPENDENT_AMBULATORY_CARE_PROVIDER_SITE_OTHER): Payer: 59 | Admitting: Internal Medicine

## 2017-03-01 ENCOUNTER — Other Ambulatory Visit (HOSPITAL_COMMUNITY)
Admission: RE | Admit: 2017-03-01 | Discharge: 2017-03-01 | Disposition: A | Discharge status: Home / Self Care | Payer: 59 | Source: Ambulatory Visit | Attending: Internal Medicine | Admitting: Internal Medicine

## 2017-03-01 VITALS — BP 96/60 | HR 99 | Ht 69.0 in | Wt 180.0 lb

## 2017-03-01 DIAGNOSIS — D649 Anemia, unspecified: Secondary | ICD-10-CM

## 2017-03-01 DIAGNOSIS — R609 Edema, unspecified: Secondary | ICD-10-CM | POA: Diagnosis not present

## 2017-03-01 DIAGNOSIS — R0602 Shortness of breath: Secondary | ICD-10-CM | POA: Insufficient documentation

## 2017-03-01 LAB — IRON AND TIBC
IRON: 42 ug/dL (ref 28–170)
Saturation Ratios: 9 % — ABNORMAL LOW (ref 10.4–31.8)
TIBC: 455 ug/dL — ABNORMAL HIGH (ref 250–450)
UIBC: 413 ug/dL

## 2017-03-01 LAB — TSH: TSH: 1.717 u[IU]/mL (ref 0.350–4.500)

## 2017-03-01 LAB — BRAIN NATRIURETIC PEPTIDE: B NATRIURETIC PEPTIDE 5: 88 pg/mL (ref 0.0–100.0)

## 2017-03-01 LAB — FERRITIN: FERRITIN: 25 ng/mL (ref 11–307)

## 2017-03-01 LAB — SEDIMENTATION RATE: Sed Rate: 37 mm/hr — ABNORMAL HIGH (ref 0–22)

## 2017-03-01 LAB — VITAMIN B12: VITAMIN B 12: 655 pg/mL (ref 180–914)

## 2017-03-01 LAB — FOLATE: FOLATE: 42 ng/mL (ref >5.9)

## 2017-03-01 NOTE — Patient Instructions (Signed)
Your physician recommends that you schedule a follow-up appointment in: to be determined    Get lab work today    Your physician has requested that you have an echocardiogram. Echocardiography is a painless test that uses sound waves to create images of your heart. It provides your doctor with information about the size and shape of your heart and how well your heart's chambers and valves are working. This procedure takes approximately one hour. There are no restrictions for this procedure.      Your physician recommends that you continue on your current medications as directed. Please refer to the Current Medication list given to you today.      Thank you for choosing [facility] !

## 2017-03-01 NOTE — Progress Notes (Signed)
Cardiology Office Note   Date:  03/01/2017   ID:  Kimberly Reyes Mar 16, 1956, MRN [id number]  PCP:  Sharilyn Sites, MD  Cardiologist:  New    Pt referred by Dr Hilma Favors for LE edema      History of Present Illness: Kimberly Reyes is a 61 y.o. female with a history of LE edema  She was seen in ED on 5/30  Pt said edema starte  about 1.5 wks prior   Pt noted some intermitted  CP radiating to L shoulder  Also some abdoimnal discomfort and dizziness  Episode of elevated liver enzyms earlier this year     Earlier this sping she was seen in ED in Feb 2018  Abdominal pain  LFTs increased  Set up for abdominal CT No vascular dz noted  No acute intraabdominal process  March had knee surgery  Post op placed on steroids for 12 days   In late April/may noticed LE swelling  Bilateral legs Also some SOB with activity Has had intermiitt epigastric pain  Not associated with activity      Seen by PA at Dr Cornelia Copa office  Rx lasix 20  LE USN odered  Varicosities Seen by Dr Hilma Favors on Tues  Lasix increased to 40  Daily    Pt says wt up 20 lbs  Clothes fitting  tighter    Dyspnea on exertion  No CP with exertion        Current Meds  Medication Sig  . aspirin EC 81 MG tablet Take 81 mg by mouth at bedtime.   . cyclobenzaprine (FLEXERIL) 10 MG tablet Take 10 mg by mouth 3 (three) times daily as needed for muscle spasms.  . cycloSPORINE (RESTASIS) 0.05 % ophthalmic emulsion Place 1 drop into both eyes at bedtime.  Marland Kitchen eletriptan (RELPAX) 40 MG tablet Take 40 mg by mouth as needed for migraine. One tablet by mouth at onset of headache. May repeat in 2 hours if headache persists or recurs.  Marland Kitchen HYDROcodone-acetaminophen (NORCO) 5-325 MG per tablet Take 1-2 tablets by mouth every 6 (six) hours as needed for moderate pain.  Marland Kitchen levothyroxine (SYNTHROID, LEVOTHROID) 75 MCG tablet Take 88 mcg by mouth daily before breakfast.   . naproxen (NAPROSYN) 500 MG tablet Take 1 po BID with food prn pain    . traMADol (ULTRAM) 50 MG tablet Take 2 tablets (100 mg total) by mouth every 6 (six) hours as needed.     Allergies:   Patient has no known allergies.   Past Medical History:  Diagnosis Date  . Anxiety   . Asthma   40 yrs ago   not current probem no inhaler use  . Headache(784.0)   . Hypothyroidism     Past Surgical History:  Procedure Laterality Date  . ABDOMINAL HYSTERECTOMY     20 years ago, complete  . CHOLECYSTECTOMY N/A 11/03/2014   Procedure: LAPAROSCOPIC CHOLECYSTECTOMY WITH INTRAOPERATIVE CHOLANGIOGRAM;  Surgeon: Gayland Curry, MD;  Location: [facility];  Service: General;  Laterality: N/A;  . COLONOSCOPY N/A 03/27/2013   Procedure: COLONOSCOPY;  Surgeon: Rogene Houston, MD;  Location: [facility];  Service: Endoscopy;  Laterality: N/A;  1030  . KNEE ARTHROSCOPY W/ MENISCAL REPAIR Right   . TUBAL LIGATION       Social History:  The patient  reports that she has never smoked. She has never used smokeless tobacco. She reports that she drinks alcohol. She reports that she does not use drugs.  Family History:  The patient's family history includes CVA in her mother; Colon cancer in her father; Pneumonia in her brother.    ROS:  Please see the history of present illness. All other systems are reviewed and  Negative to the above problem except as noted.    PHYSICAL EXAM: VS:  Pulse 99   Ht 5\' 9"  (1.753 m)   Wt 81.6 kg (180 lb)   SpO2 98%   BMI 26.58 kg/m   GEN: Well nourished, well developed, in no acute distress  HEENT: normal  Neck: no JVD, carotid bruits, or masses Cardiac: RRR; no murmurs, rubs, or gallops, 2+ edema  Respiratory:  clear to auscultation bilaterally, normal work of breathing GI: soft, nontender, nondistended, + BS  No hepatomegaly  MS: no deformity Moving all extremities   Skin: warm and dry, no rash Neuro:  Strength and sensation are intact Psych: euthymic mood, full affect   EKG:  EKG is ordered today.  On 5/30  SR 86 bpm  Poss  IWMI   Lipid Panel No results found for: CHOL, TRIG, HDL, CHOLHDL, VLDL, LDLCALC, LDLDIRECT    Wt Readings from Last 3 Encounters:  03/01/17 81.6 kg (180 lb)  02/14/17 82.1 kg (181 lb)  02/07/17 75.8 kg (167 lb)      ASSESSMENT AND PLAN:  1  LE edema  Unusual  All started not long after knee injectied  Legs remain very edematous No signs of L heart failure   Lungs are clear  JVP is normal  WIll review labs  Check ANA, BMET   Set up for echo  Continue lasix 40 daily for now  2  Dyspnea  Set up for echo  Continue lasix    2  Anemia  ? Will repeat CBC and check anemia panel    F/U based on test resuts  Current medicines are reviewed at length with the patient today.  The patient does not have concerns regarding medicines.  Signed, Dorris Carnes, MD  03/01/2017 8:38 AM    [address], [place], [place]  [address] Phone: [phone]; Fax: [phone]

## 2017-03-02 LAB — ANTINUCLEAR ANTIBODIES, IFA: ANTINUCLEAR ANTIBODIES, IFA: NEGATIVE

## 2017-03-06 LAB — HEPATIC FUNCTION PANEL
ALBUMIN: 3.7 g/dL (ref 3.6–5.1)
ALK PHOS: 151 U/L — AB (ref 33–130)
ALT: 199 U/L — ABNORMAL HIGH (ref 6–29)
AST: 75 U/L — AB (ref 10–35)
BILIRUBIN INDIRECT: 0.4 mg/dL (ref 0.2–1.2)
Bilirubin, Direct: 0.2 mg/dL (ref <=0.2)
TOTAL PROTEIN: 6.7 g/dL (ref 6.1–8.1)
Total Bilirubin: 0.6 mg/dL (ref 0.2–1.2)

## 2017-03-07 DIAGNOSIS — G43709 Chronic migraine without aura, not intractable, without status migrainosus: Secondary | ICD-10-CM | POA: Diagnosis not present

## 2017-03-08 ENCOUNTER — Telehealth (INDEPENDENT_AMBULATORY_CARE_PROVIDER_SITE_OTHER): Payer: Self-pay | Admitting: Internal Medicine

## 2017-03-08 DIAGNOSIS — R748 Abnormal levels of other serum enzymes: Secondary | ICD-10-CM

## 2017-03-08 NOTE — Telephone Encounter (Signed)
Will rule out auto immune process 

## 2017-03-09 ENCOUNTER — Ambulatory Visit (HOSPITAL_COMMUNITY)
Admission: RE | Admit: 2017-03-09 | Discharge: 2017-03-09 | Disposition: A | Discharge status: Home / Self Care | Payer: 59 | Source: Ambulatory Visit | Attending: Internal Medicine | Admitting: Internal Medicine

## 2017-03-09 DIAGNOSIS — R609 Edema, unspecified: Secondary | ICD-10-CM | POA: Diagnosis not present

## 2017-03-09 DIAGNOSIS — I081 Rheumatic disorders of both mitral and tricuspid valves: Secondary | ICD-10-CM | POA: Diagnosis not present

## 2017-03-09 DIAGNOSIS — R0602 Shortness of breath: Secondary | ICD-10-CM

## 2017-03-09 DIAGNOSIS — I700 Atherosclerosis of aorta: Secondary | ICD-10-CM | POA: Diagnosis not present

## 2017-03-09 LAB — ECHOCARDIOGRAM COMPLETE
CHL CUP RV SYS PRESS: 17 mmHg
CHL CUP TV REG PEAK VELOCITY: 187 cm/s
E decel time: 232 msec
EERAT: 8.24
FS: 46 % — AB (ref 28–44)
IV/PV OW: 1.01
LA ID, A-P, ES: 29 mm
LA diam index: 1.44 cm/m2
LAVOL: 38.6 mL
LAVOLA4C: 32.6 mL
LAVOLIN: 19.2 mL/m2
LEFT ATRIUM END SYS DIAM: 29 mm
LV dias vol: 50 mL (ref 46–106)
LV e' LATERAL: 11.5 cm/s
LV sys vol index: 9 mL/m2
LV sys vol: 17 mL
LVDIAVOLIN: 25 mL/m2
LVEEAVG: 8.24
LVEEMED: 8.24
LVOT VTI: 25.1 cm
LVOT area: 2.84 cm2
LVOT diameter: 19 mm
LVOT peak grad rest: 6 mmHg
LVOTPV: 118 cm/s
LVOTSV: 71 mL
MV Dec: 232
MV Peak grad: 4 mmHg
MV pk A vel: 87.1 m/s
MV pk E vel: 94.8 m/s
PW: 9.12 mm — AB (ref 0.6–1.1)
RV LATERAL S' VELOCITY: 14.9 cm/s
RV TAPSE: 18.4 mm
Simpson's disk: 66
Stroke v: 33 ml
TDI e' lateral: 11.5
TDI e' medial: 10.8
TR max vel: 187 cm/s

## 2017-03-09 NOTE — Progress Notes (Signed)
*  PRELIMINARY RESULTS* Echocardiogram 2D Echocardiogram has been performed.  Kimberly Reyes 03/09/2017, 4:05 PM

## 2017-03-13 LAB — HEPATITIS PANEL, ACUTE
HCV Ab: NEGATIVE
Hep A IgM: NONREACTIVE
Hep B C IgM: NONREACTIVE
Hepatitis B Surface Ag: NEGATIVE

## 2017-03-13 LAB — HEPATIC FUNCTION PANEL
ALT: 233 U/L — AB (ref 6–29)
AST: 66 U/L — ABNORMAL HIGH (ref 10–35)
Albumin: 3.7 g/dL (ref 3.6–5.1)
Alkaline Phosphatase: 153 U/L — ABNORMAL HIGH (ref 33–130)
BILIRUBIN DIRECT: 0.1 mg/dL (ref <=0.2)
BILIRUBIN TOTAL: 0.5 mg/dL (ref 0.2–1.2)
Indirect Bilirubin: 0.4 mg/dL (ref 0.2–1.2)
Total Protein: 6.3 g/dL (ref 6.1–8.1)

## 2017-03-13 LAB — IRON: IRON: 35 ug/dL — AB (ref 45–160)

## 2017-03-14 LAB — FERRITIN: FERRITIN: 32 ng/mL (ref 20–288)

## 2017-03-15 ENCOUNTER — Telehealth (INDEPENDENT_AMBULATORY_CARE_PROVIDER_SITE_OTHER): Payer: Self-pay | Admitting: Internal Medicine

## 2017-03-15 DIAGNOSIS — R748 Abnormal levels of other serum enzymes: Secondary | ICD-10-CM

## 2017-03-15 LAB — ANA: ANA: NEGATIVE

## 2017-03-15 LAB — IGG: IgG (Immunoglobin G), Serum: 1151 mg/dL (ref 694–1618)

## 2017-03-15 LAB — ALPHA-1-ANTITRYPSIN: A1 ANTITRYPSIN SER: 203 mg/dL — AB (ref 83–199)

## 2017-03-15 LAB — CERULOPLASMIN: CERULOPLASMIN: 29 mg/dL (ref 18–53)

## 2017-03-15 NOTE — Telephone Encounter (Signed)
Kimberly Reyes, US abdomen/elast

## 2017-03-16 ENCOUNTER — Telehealth (INDEPENDENT_AMBULATORY_CARE_PROVIDER_SITE_OTHER): Payer: Self-pay | Admitting: *Deleted

## 2017-03-16 NOTE — Telephone Encounter (Signed)
Spoke to patient about [place] -- she wants you to call her about lab results  Ph# [phone]

## 2017-03-19 NOTE — Telephone Encounter (Signed)
I have spoken with patient.  [place] has been ordered

## 2017-03-20 ENCOUNTER — Ambulatory Visit (HOSPITAL_COMMUNITY): Payer: 59

## 2017-03-21 ENCOUNTER — Ambulatory Visit (HOSPITAL_COMMUNITY)
Admission: RE | Admit: 2017-03-21 | Discharge: 2017-03-21 | Disposition: A | Discharge status: Home / Self Care | Payer: 59 | Source: Ambulatory Visit | Attending: Internal Medicine | Admitting: Internal Medicine

## 2017-03-21 DIAGNOSIS — R748 Abnormal levels of other serum enzymes: Secondary | ICD-10-CM | POA: Diagnosis not present

## 2017-03-21 DIAGNOSIS — R7989 Other specified abnormal findings of blood chemistry: Secondary | ICD-10-CM | POA: Diagnosis not present

## 2017-04-02 ENCOUNTER — Other Ambulatory Visit (INDEPENDENT_AMBULATORY_CARE_PROVIDER_SITE_OTHER): Payer: Self-pay | Admitting: *Deleted

## 2017-04-02 DIAGNOSIS — R748 Abnormal levels of other serum enzymes: Secondary | ICD-10-CM

## 2017-04-04 DIAGNOSIS — Z1389 Encounter for screening for other disorder: Secondary | ICD-10-CM | POA: Diagnosis not present

## 2017-04-04 DIAGNOSIS — R7982 Elevated C-reactive protein (CRP): Secondary | ICD-10-CM | POA: Diagnosis not present

## 2017-04-04 DIAGNOSIS — R945 Abnormal results of liver function studies: Secondary | ICD-10-CM | POA: Diagnosis not present

## 2017-04-04 DIAGNOSIS — R7309 Other abnormal glucose: Secondary | ICD-10-CM | POA: Diagnosis not present

## 2017-04-04 DIAGNOSIS — R600 Localized edema: Secondary | ICD-10-CM | POA: Diagnosis not present

## 2017-04-04 NOTE — Progress Notes (Signed)
Patient has an appointment 08/15/17, discussed with Terri and per Terri, it's fine to keep this appointment.

## 2017-04-09 ENCOUNTER — Telehealth (INDEPENDENT_AMBULATORY_CARE_PROVIDER_SITE_OTHER): Payer: Self-pay | Admitting: Internal Medicine

## 2017-04-09 NOTE — Telephone Encounter (Signed)
Patient called Kimberly Reyes requesting her medical records and also requested an appointment with Dr. Laural Golden. I spoke to the patient and told her the first available appointment with Dr. Laural Golden is Tuesday, August 07, 2017.  She told me to forget it.  I offered to scheduled her with Terri sooner and she stated no, she is already seeing Terri.

## 2017-04-10 DIAGNOSIS — E039 Hypothyroidism, unspecified: Secondary | ICD-10-CM | POA: Diagnosis not present

## 2017-06-01 DIAGNOSIS — G43709 Chronic migraine without aura, not intractable, without status migrainosus: Secondary | ICD-10-CM | POA: Diagnosis not present

## 2017-06-13 DIAGNOSIS — G43709 Chronic migraine without aura, not intractable, without status migrainosus: Secondary | ICD-10-CM | POA: Diagnosis not present

## 2017-06-15 NOTE — Progress Notes (Signed)
Office Visit Note  Patient: Kimberly Reyes             Date of Birth: 06-13-1956           MRN: [id number]             PCP: Sharilyn Sites, MD Referring: Sharilyn Sites, MD Visit Date: 06/26/2017 Occupation: Educational psychologist for [employer]    Subjective:  Pain in multiple joints and muscles.   History of Present Illness: [name] is a 61 y.o. female seen in consultation per request of Dr. Hilma Favors. According to patient she has had history of migraines for which she has taken anti-inflammatories in the past intermittently. In March 2018 she underwent right knee joint arthroscopic surgery for meniscal tear repair by Dr. Noemi Chapel. He advised future total knee replacement. She also has arthritis in her left knee. She states in May 2018 she started having bilateral pedal edema which was quite severe to the point she could not wear her shoes. She describes more edema in her left lower extremity compared to the right lower extremity. The same time she started experiencing increased joint pain and muscle pain. She also was experiencing right upper quadrant pain for which she went to emergency room twice. The labs revealed elevated LFTs. She was also given Lasix to decrease edema which was helpful. The ER physician looked back and found that her LFTs were elevated in 2016 and 2017. She describes pain in her bilateral hips, bilateral knee joints and bilateral ankles. She denies any joint swelling. She states the pain is generalized all over in her joints and muscles. She also has lower back pain. In July 2018 she was seen by Dr. Olevia Perches nurse practitioner. We did some lab work including hepatitis B and hepatitis C panel which were negative. Advised her to come for follow-up visit in December. She had cardiology evaluation by Dr. Harrington Challenger and the workup was negative per patient. She has had symptoms of dry eyes which she relates to having Lasix surgery and being on computer for several hours.  Activities  of Daily Living:  Patient reports morning stiffness for 15 minutes.   Patient Reports nocturnal pain.  Difficulty dressing/grooming: Denies Difficulty climbing stairs: Reports Difficulty getting out of chair: Denies Difficulty using hands for taps, buttons, cutlery, and/or writing: Denies   Review of Systems  Constitutional: Positive for fatigue. Negative for night sweats, weight gain, weight loss and weakness.  HENT: Negative.  Negative for mouth sores, trouble swallowing, trouble swallowing, mouth dryness and nose dryness.   Eyes: Positive for dryness. Negative for pain, redness and visual disturbance.  Respiratory: Negative.  Negative for cough, shortness of breath and difficulty breathing.   Cardiovascular: Positive for swelling in legs/feet. Negative for chest pain, palpitations, hypertension and irregular heartbeat.  Gastrointestinal: Positive for constipation. Negative for blood in stool and diarrhea.  Endocrine: Negative for increased urination.  Genitourinary: Negative for vaginal dryness.  Musculoskeletal: Positive for arthralgias, joint pain, muscle weakness, morning stiffness and muscle tenderness. Negative for joint swelling, myalgias and myalgias.  Skin: Negative.  Negative for color change, rash, hair loss, skin tightness, ulcers and sensitivity to sunlight.  Allergic/Immunologic: Negative for susceptible to infections.  Neurological: Positive for headaches. Negative for dizziness, numbness, memory loss and night sweats.  Hematological: Negative for swollen glands.  Psychiatric/Behavioral: Positive for sleep disturbance. Negative for depressed mood. The patient is not nervous/anxious.     PMFS History:  Patient Active Problem List   Diagnosis Date Noted  .  Hypothyroid 11/25/2014  . Hypothyroidism 11/25/2014    Past Medical History:  Diagnosis Date  . Anxiety   . Asthma   40 yrs ago   not current probem no inhaler use  . Headache(784.0)   . Hypothyroidism       Family History  Problem Relation Age of Onset  . Colon cancer Father   . CVA Mother   . Pneumonia Brother    Past Surgical History:  Procedure Laterality Date  . ABDOMINAL HYSTERECTOMY     20 years ago, complete  . CHOLECYSTECTOMY N/A 11/03/2014   Procedure: LAPAROSCOPIC CHOLECYSTECTOMY WITH INTRAOPERATIVE CHOLANGIOGRAM;  Surgeon: Gayland Curry, MD;  Location: [facility];  Service: General;  Laterality: N/A;  . COLONOSCOPY N/A 03/27/2013   Procedure: COLONOSCOPY;  Surgeon: Rogene Houston, MD;  [facility];  Service: Endoscopy;  Laterality: N/A;  1030  . KNEE ARTHROSCOPY W/ MENISCAL REPAIR Right   . TUBAL LIGATION     Social History   Social History Narrative  . No narrative on file     Objective: Vital Signs: BP 126/73 (BP Location: Left Arm, Patient Position: Sitting, Cuff Size: Normal)   Pulse 86   Ht '5\' 9"'$  (1.753 m)   Wt 184 lb (83.5 kg)   BMI 27.17 kg/m    Physical Exam  Constitutional: She is oriented to person, place, and time. She appears well-developed and well-nourished.  HENT:  Head: Normocephalic and atraumatic.  Eyes: Conjunctivae and EOM are normal.  Neck: Normal range of motion.  Cardiovascular: Normal rate, regular rhythm, normal heart sounds and intact distal pulses.   Pulmonary/Chest: Effort normal and breath sounds normal.  Abdominal: Soft. Bowel sounds are normal.  Lymphadenopathy:    She has no cervical adenopathy.  Neurological: She is alert and oriented to person, place, and time.  Skin: Skin is warm and dry. Capillary refill takes less than 2 seconds.  Psychiatric: She has a normal mood and affect. Her behavior is normal.  Nursing note and vitals reviewed.    Musculoskeletal Exam: C-spine and thoracic spine and lumbar spine limited range of motion. She does have significant thoracic kyphosis. Shoulder joints elbow joints wrist joint MCPs PIPs DIPs with good range of motion with no synovitis. She describes some tenderness across her  MCPs. Hip joints are good range of motion. She has discomfort on palpation over SI joints. She is tenderness over bilateral trochanteric bursa. She is warmth and swelling in her right knee joint without effusion. Left knee joint has some crepitus. She has some tenderness across MTP joints with bunion formation. No synovitis was noted.  CDAI Exam: No CDAI exam completed.    Investigation: Findings:  April 04 2017 CMP creatinine 1.06 GFR 56, alkaline phosphatase 164, AST 56, ALT 253, GGT 108, C-reactive protein 17.3, ANA negative   04/04/2017 CRP 17.3, BNP normal,  03/13/2017 Iron low 35,  Antinuclear Antib (ANA) negative ,Ferritin normal ,and Hepatitis, Acute normal  02/27/2017 D dimer elevated 0.61, RF normal, CCP normal, ESR 13 11/09/2016 monospot negative  02/28/2017 Lower extremity Venous doppler Right lower ext, negative for DVT    CBC Latest Ref Rng & Units 02/07/2017 10/16/2016 11/02/2014  WBC 4.0 - 10.5 K/uL 7.8 7.8 8.6  Hemoglobin 12.0 - 15.0 g/dL 10.9(L) 11.2(L) 11.7(L)  Hematocrit 36.0 - 46.0 % 33.3(L) 34.0(L) 36.8  Platelets 150 - 400 K/uL 200 250 283   CMP Latest Ref Rng & Units 03/13/2017 03/06/2017 02/14/2017  Glucose 65 - 99 mg/dL - - -  BUN 6 - 20 mg/dL - - -  Creatinine 0.44 - 1.00 mg/dL - - -  Sodium 135 - 145 mmol/L - - -  Potassium 3.5 - 5.1 mmol/L - - -  Chloride 101 - 111 mmol/L - - -  CO2 22 - 32 mmol/L - - -  Calcium 8.9 - 10.3 mg/dL - - -  Total Protein 6.1 - 8.1 g/dL 6.3 6.7 6.4  Total Bilirubin 0.2 - 1.2 mg/dL 0.5 0.6 0.4  Alkaline Phos 33 - 130 U/L 153(H) 151(H) 131(H)  AST 10 - 35 U/L 66(H) 75(H) 78(H)  ALT 6 - 29 U/L 233(H) 199(H) 100(H)      Imaging: Xr Hip Unilat W Or W/o Pelvis 2-3 Views Left  Result Date: 06/26/2017 Hip joint space was normal. No SI joint sclerosis or joint space  narrowing was noted. Impression: Normal x-ray of the hip joint.  Xr Hip Unilat W Or W/o Pelvis 2-3 Views Right  Result Date: 06/26/2017 Mild superolateral narrowing was  noted. Mild sclerosis was noted. No SI joint narrowing or sclerosis was noted. Impression: These findings are consistent with mild osteoarthritis of the hip joint.  Xr Foot 2 Views Left  Result Date: 06/26/2017 Severe first MTP narrowing and subluxation noted. None of the lower MTP showed narrowing or erosive changes. Mild PIP/DIP narrowing and spurring noted. No intertarsal joint space narrowing was noted.No tibiotalar for subtalar joint space narrowing was noted. Impression: These findings are consistent with osteoarthritis of the foot.  Xr Foot 2 Views Right  Result Date: 06/26/2017 First MTP narrowing, PIP/DIP narrowing was noted. None of the other MTPs or intertarsal joint showed narrowing. No tibiotalar or subtalar joint space narrowing was noted. Impression: These findings are consistent with osteoarthritis of the foot.  Xr Hand 2 View Left  Result Date: 06/26/2017 Severe CMC narrowing noted, PIP narrowing noted. No MCP or intercarpal joint space narrowing was noted. No erosive changes were noted. Impression: These findings are consistent with osteoarthritis of the hand  Xr Hand 2 View Right  Result Date: 06/26/2017 PIP narrowing was noted. No MCP or intercarpal joint space narrowing was noted. Impression: These findings are consistent with mild osteoarthritis of the hand   Speciality Comments: No specialty comments available.    Procedures:  No procedures performed Allergies: Patient has no known allergies.   Assessment / Plan:     Visit Diagnoses: Multiple joint pain - patient complains of arthralgias in multiple joints without any joint swelling. All autoimmune workup has been negative which was done so far. I will obtain additional labs today. I do not see any synovitis on examination which she has arthralgias and myalgias. It is possible that she may have some underlying myofascial pain syndrome.Plan: CBC with Differential/Platelet, COMPLETE METABOLIC PANEL WITH GFR,  Angiotensin converting enzyme, Pan-ANCA  Pain in both hands - Plan: XR Hand 2 View Right, XR Hand 2 View Left  Pain in both feet - Plan: XR Foot 2 Views Left, XR Foot 2 Views Right  Pain of both hip joints - Plan: XR HIP UNILAT W OR W/O PELVIS 2-3 VIEWS RIGHT, XR HIP UNILAT W OR W/O PELVIS 2-3 VIEWS LEFT  Trochanteric bursitis of both hips: ITB and exercise were demonstrated and handout was given.  Lower extremity edema: She is on Lasix now and I do not see much edema on examination.  Elevated LFTs: She is significantly high LFTs. The etiology is unknown. She would like to see a liver specialist in town.she does not  want to wait for her appointment till December. Given her some names as an option.  Elevated C-reactive protein (CRP)  History of hypothyroidism  Myalgia - Plan: CK, Aldolase  Other fatigue - Plan: Serum protein electrophoresis with reflex, IgG, IgA, IgM, Quantiferon tb gold assay (blood)   Kyphosis: My concern is that she may have osteoporosis. I've advised her to schedule DXA through Dr. Delanna Ahmadi office.   Orders: Orders Placed This Encounter  Procedures  . XR Hand 2 View Right  . XR Hand 2 View Left  . XR Foot 2 Views Left  . XR Foot 2 Views Right  . XR HIP UNILAT W OR W/O PELVIS 2-3 VIEWS RIGHT  . XR HIP UNILAT W OR W/O PELVIS 2-3 VIEWS LEFT  . CBC with Differential/Platelet  . COMPLETE METABOLIC PANEL WITH GFR  . CK  . Serum protein electrophoresis with reflex  . IgG, IgA, IgM  . Quantiferon tb gold assay (blood)  . Angiotensin converting enzyme  . Pan-ANCA  . Aldolase   No orders of the defined types were placed in this encounter.   Face-to-face time spent with patient was 60 minutes.greater than 50% of time was spent in counseling and coordination of care.  Follow-Up Instructions: Return for polyarthralgia.   Bo Merino, MD  Note - This record has been created using Editor, commissioning.  Chart creation errors have been sought, but may not  always  have been located. Such creation errors do not reflect on  the standard of medical care.

## 2017-06-19 DIAGNOSIS — M1711 Unilateral primary osteoarthritis, right knee: Secondary | ICD-10-CM | POA: Diagnosis not present

## 2017-06-19 DIAGNOSIS — M25561 Pain in right knee: Secondary | ICD-10-CM | POA: Diagnosis not present

## 2017-06-26 ENCOUNTER — Ambulatory Visit (INDEPENDENT_AMBULATORY_CARE_PROVIDER_SITE_OTHER): Payer: Self-pay

## 2017-06-26 ENCOUNTER — Ambulatory Visit (INDEPENDENT_AMBULATORY_CARE_PROVIDER_SITE_OTHER): Payer: 59

## 2017-06-26 ENCOUNTER — Encounter (INDEPENDENT_AMBULATORY_CARE_PROVIDER_SITE_OTHER): Payer: Self-pay

## 2017-06-26 ENCOUNTER — Encounter: Payer: Self-pay | Admitting: Rheumatology

## 2017-06-26 ENCOUNTER — Ambulatory Visit (INDEPENDENT_AMBULATORY_CARE_PROVIDER_SITE_OTHER): Payer: 59 | Admitting: Rheumatology

## 2017-06-26 VITALS — BP 126/73 | HR 86 | Ht 69.0 in | Wt 184.0 lb

## 2017-06-26 DIAGNOSIS — R600 Localized edema: Secondary | ICD-10-CM | POA: Diagnosis not present

## 2017-06-26 DIAGNOSIS — M79642 Pain in left hand: Secondary | ICD-10-CM | POA: Diagnosis not present

## 2017-06-26 DIAGNOSIS — M2550 Pain in unspecified joint: Secondary | ICD-10-CM | POA: Diagnosis not present

## 2017-06-26 DIAGNOSIS — M7061 Trochanteric bursitis, right hip: Secondary | ICD-10-CM

## 2017-06-26 DIAGNOSIS — M79671 Pain in right foot: Secondary | ICD-10-CM | POA: Diagnosis not present

## 2017-06-26 DIAGNOSIS — M79672 Pain in left foot: Secondary | ICD-10-CM

## 2017-06-26 DIAGNOSIS — M25551 Pain in right hip: Secondary | ICD-10-CM | POA: Diagnosis not present

## 2017-06-26 DIAGNOSIS — M79641 Pain in right hand: Secondary | ICD-10-CM

## 2017-06-26 DIAGNOSIS — R945 Abnormal results of liver function studies: Secondary | ICD-10-CM | POA: Diagnosis not present

## 2017-06-26 DIAGNOSIS — R5383 Other fatigue: Secondary | ICD-10-CM | POA: Diagnosis not present

## 2017-06-26 DIAGNOSIS — M25552 Pain in left hip: Secondary | ICD-10-CM

## 2017-06-26 DIAGNOSIS — Z8639 Personal history of other endocrine, nutritional and metabolic disease: Secondary | ICD-10-CM | POA: Diagnosis not present

## 2017-06-26 DIAGNOSIS — R7989 Other specified abnormal findings of blood chemistry: Secondary | ICD-10-CM

## 2017-06-26 DIAGNOSIS — M791 Myalgia, unspecified site: Secondary | ICD-10-CM

## 2017-06-26 DIAGNOSIS — R7982 Elevated C-reactive protein (CRP): Secondary | ICD-10-CM

## 2017-06-26 DIAGNOSIS — M7062 Trochanteric bursitis, left hip: Secondary | ICD-10-CM

## 2017-06-26 NOTE — Patient Instructions (Signed)
Iliotibial Bursitis Rehab Ask your health care provider which exercises are safe for you. Do exercises exactly as told by your health care provider and adjust them as directed. It is normal to feel mild stretching, pulling, tightness, or discomfort as you do these exercises, but you should stop right away if you feel sudden pain or your pain gets worse.Do not begin these exercises until told by your health care provider. Stretching and range of motion exercises These exercises warm up your muscles and joints and improve the movement and flexibility of your leg. These exercises also help to relieve pain and stiffness. Exercise A: Quadriceps stretch, prone  1. Lie on your abdomen on a firm surface, such as a bed or padded floor. 2. Bend your left / right knee and hold your ankle. If you cannot reach your ankle or pant leg, loop a belt around your foot and grab the belt instead. 3. Gently pull your heel toward your buttocks. Your knee should not slide out to the side. You should feel a stretch in the front of your thigh and knee. 4. Hold this position for __________ seconds. Repeat __________ times. Complete this exercise __________ times a day. Exercise B: Lunge ( adductor stretch) 1. Stand and spread your legs about 3 feet (about 1 m) apart. Put your left / right leg slightly back for balance. 2. Lean away from your left / right leg by bending your other knee and shifting your weight toward your bent knee. You may rest your hands on your thigh for balance. You should feel a stretch in your left / right inner thigh. 3. Hold for __________ seconds. Repeat __________ times. Complete this exercise __________ times a day. Exercise C: Hamstring stretch, supine  1. Lie on your back. 2. Hold both ends of a belt or towel as you loop it over the ball of your left / right foot. The ball of your foot is on the walking surface, right under your toes. 3. Straighten your left / right knee and slowly pull on  the belt to raise your leg. Stop when you feel a gentle stretch in the back of your left / right knee or thigh. ? Do not let your left / right knee bend. ? Keep your other leg flat on the floor. 4. Hold this position for __________ seconds. Repeat __________ times. Complete this exercise __________ times a day. Strengthening exercises These exercises build strength and endurance in your leg. Endurance is the ability to use your muscles for a long time, even after they get tired. Exercise D: Quadriceps wall slides  1. Lean your back against a smooth wall or door while you walk your feet out 18-24 inches (46-61 cm) from it. 2. Place your feet hip-width apart. 3. Slowly slide down the wall or door until your knees bend as far as told by your health care provider. Keep your knees over your heels, not your toes. Keep your knees in line with your hips. 4. Hold for __________ seconds. 5. Push through your heels to stand up to rest for __________ seconds after each repetition. Repeat __________ times. Complete this exercise __________ times a day. Exercise E: Straight leg raises ( hip abductors) 1. Lie on your side, with your left / right leg in the top position. Lie so your head, shoulder, knee, and hip line up with each other. You may bend your bottom knee to help you balance. 2. Lift your top leg 4-6 inches (10-15 cm) while keeping your   toes pointed straight ahead. 3. Hold this position for __________ seconds. 4. Slowly lower your leg to the starting position. Allow your muscles to relax completely after each repetition. Repeat __________ times. Complete this exercise __________ times a day. Exercise F: Straight leg raises ( hip extensors) 1. Lie on your abdomen on a firm surface. You can put a pillow under your hips if that is more comfortable. 2. Tense the muscles in your buttocks and lift your left / right leg about 4-6 inches (10-15 cm). Keep your knee straight as you lift your leg. 3. Hold  this position for __________ seconds. 4. Slowly lower your leg to the starting position. 5. Let your leg relax completely after each repetition. Repeat __________ times. Complete this exercise __________ times a day. Exercise G: Bridge ( hip extensors) 1. Lie on your back on a firm surface with your knees bent and your feet flat on the floor. 2. Tighten your buttocks muscles and lift your bottom off the floor until your trunk is level with your thighs. ? Do not arch your back. ? You should feel the muscles working in your buttocks and the back of your thighs. If you do not feel these muscles, slide your feet 1-2 inches (2.5-5 cm) farther away from your buttocks. 3. Hold this position for __________ seconds. 4. Slowly lower your hips to the starting position. 5. Let your buttocks muscles relax completely between repetitions. 6. If this exercise is too easy, try doing it with your arms crossed over your chest. Repeat __________ times. Complete this exercise __________ times a day. This information is not intended to replace advice given to you by your health care provider. Make sure you discuss any questions you have with your health care provider. Document Released: 08/28/2005 Document Revised: 05/04/2016 Document Reviewed: 08/10/2015 Elsevier Interactive Patient Education  2018 [employer]  

## 2017-06-27 DIAGNOSIS — R740 Nonspecific elevation of levels of transaminase and lactic acid dehydrogenase [LDH]: Secondary | ICD-10-CM | POA: Diagnosis not present

## 2017-06-27 DIAGNOSIS — R601 Generalized edema: Secondary | ICD-10-CM | POA: Diagnosis not present

## 2017-06-27 DIAGNOSIS — M2550 Pain in unspecified joint: Secondary | ICD-10-CM | POA: Diagnosis not present

## 2017-06-28 NOTE — Progress Notes (Signed)
LFTs are better some of the labs are still pending. Please fax results to Dr. Benson Norway.

## 2017-07-02 LAB — PAN-ANCA: ANCA Screen: NEGATIVE

## 2017-07-02 LAB — CBC WITH DIFFERENTIAL/PLATELET
BASOS PCT: 1 %
Basophils Absolute: 83 cells/uL (ref 0–200)
EOS PCT: 2.5 %
Eosinophils Absolute: 208 cells/uL (ref 15–500)
HEMATOCRIT: 34 % — AB (ref 35.0–45.0)
Hemoglobin: 11 g/dL — ABNORMAL LOW (ref 11.7–15.5)
LYMPHS ABS: 1934 {cells}/uL (ref 850–3900)
MCH: 27.8 pg (ref 27.0–33.0)
MCHC: 32.4 g/dL (ref 32.0–36.0)
MCV: 86.1 fL (ref 80.0–100.0)
MPV: 11.4 fL (ref 7.5–12.5)
Monocytes Relative: 10.4 %
NEUTROS PCT: 62.8 %
Neutro Abs: 5212 cells/uL (ref 1500–7800)
PLATELETS: 210 10*3/uL (ref 140–400)
RBC: 3.95 10*6/uL (ref 3.80–5.10)
RDW: 13.8 % (ref 11.0–15.0)
TOTAL LYMPHOCYTE: 23.3 %
WBC: 8.3 10*3/uL (ref 3.8–10.8)
WBCMIX: 863 {cells}/uL (ref 200–950)

## 2017-07-02 LAB — ALDOLASE: Aldolase: 7.6 U/L (ref <=8.1)

## 2017-07-02 LAB — COMPLETE METABOLIC PANEL WITH GFR
AG RATIO: 1.3 (calc) (ref 1.0–2.5)
ALKALINE PHOSPHATASE (APISO): 154 U/L — AB (ref 33–130)
ALT: 108 U/L — AB (ref 6–29)
AST: 19 U/L (ref 10–35)
Albumin: 4 g/dL (ref 3.6–5.1)
BUN: 13 mg/dL (ref 7–25)
CHLORIDE: 100 mmol/L (ref 98–110)
CO2: 31 mmol/L (ref 20–32)
Calcium: 9.3 mg/dL (ref 8.6–10.4)
Creat: 0.98 mg/dL (ref 0.50–0.99)
GFR, EST AFRICAN AMERICAN: 71 mL/min/{1.73_m2} (ref >=60)
GFR, Est Non African American: 61 mL/min/{1.73_m2} (ref >=60)
GLUCOSE: 97 mg/dL (ref 65–99)
Globulin: 3.1 g/dL (calc) (ref 1.9–3.7)
POTASSIUM: 3.8 mmol/L (ref 3.5–5.3)
Sodium: 138 mmol/L (ref 135–146)
TOTAL PROTEIN: 7.1 g/dL (ref 6.1–8.1)
Total Bilirubin: 0.4 mg/dL (ref 0.2–1.2)

## 2017-07-02 LAB — PROTEIN ELECTROPHORESIS, SERUM, WITH REFLEX
ALPHA 1: 0.3 g/dL (ref 0.2–0.3)
ALPHA 2: 0.8 g/dL (ref 0.5–0.9)
Albumin ELP: 3.9 g/dL (ref 3.8–4.8)
BETA 2: 0.4 g/dL (ref 0.2–0.5)
Beta Globulin: 0.5 g/dL (ref 0.4–0.6)
Gamma Globulin: 1.2 g/dL (ref 0.8–1.7)
Total Protein: 7.1 g/dL (ref 6.1–8.1)

## 2017-07-02 LAB — QUANTIFERON TB GOLD ASSAY (BLOOD)
Mitogen-Nil: >10 IU/mL
QUANTIFERON(R)-TB GOLD: NEGATIVE
Quantiferon Nil Value: 0.03 IU/mL
Quantiferon Tb Ag Minus Nil Value: <0 IU/mL

## 2017-07-02 LAB — CK: CK TOTAL: 138 U/L (ref 29–143)

## 2017-07-02 LAB — IGG, IGA, IGM
IGG (IMMUNOGLOBIN G), SERUM: 1317 mg/dL (ref 694–1618)
IgM, Serum: 60 mg/dL (ref 48–271)
Immunoglobulin A: 315 mg/dL (ref 81–463)

## 2017-07-02 LAB — IFE INTERPRETATION: IMMUNOFIX ELECTR INT: NOT DETECTED

## 2017-07-02 LAB — ANGIOTENSIN CONVERTING ENZYME: Angiotensin-Converting Enzyme: 109 U/L — ABNORMAL HIGH (ref 9–67)

## 2017-07-02 NOTE — Progress Notes (Signed)
I spoke with patient and discussed labs with her. This for a copy of her labs to her PCP and Dr. Benson Norway.

## 2017-07-04 DIAGNOSIS — R740 Nonspecific elevation of levels of transaminase and lactic acid dehydrogenase [LDH]: Secondary | ICD-10-CM | POA: Diagnosis not present

## 2017-07-09 ENCOUNTER — Other Ambulatory Visit (HOSPITAL_COMMUNITY): Payer: Self-pay | Admitting: Gastroenterology

## 2017-07-09 DIAGNOSIS — R748 Abnormal levels of other serum enzymes: Secondary | ICD-10-CM

## 2017-07-18 ENCOUNTER — Other Ambulatory Visit: Payer: Self-pay | Admitting: Radiology

## 2017-07-18 DIAGNOSIS — L570 Actinic keratosis: Secondary | ICD-10-CM | POA: Diagnosis not present

## 2017-07-18 DIAGNOSIS — L821 Other seborrheic keratosis: Secondary | ICD-10-CM | POA: Diagnosis not present

## 2017-07-18 DIAGNOSIS — D485 Neoplasm of uncertain behavior of skin: Secondary | ICD-10-CM | POA: Diagnosis not present

## 2017-07-18 DIAGNOSIS — D1800 Hemangioma unspecified site: Secondary | ICD-10-CM | POA: Diagnosis not present

## 2017-07-19 ENCOUNTER — Other Ambulatory Visit: Payer: Self-pay | Admitting: Physician Assistant

## 2017-07-19 ENCOUNTER — Other Ambulatory Visit: Payer: Self-pay | Admitting: Student

## 2017-07-20 ENCOUNTER — Ambulatory Visit (HOSPITAL_COMMUNITY)
Admission: RE | Admit: 2017-07-20 | Discharge: 2017-07-20 | Disposition: A | Discharge status: Home / Self Care | Payer: 59 | Source: Ambulatory Visit | Attending: Gastroenterology | Admitting: Gastroenterology

## 2017-07-20 ENCOUNTER — Encounter (HOSPITAL_COMMUNITY): Payer: Self-pay

## 2017-07-20 DIAGNOSIS — Z79899 Other long term (current) drug therapy: Secondary | ICD-10-CM | POA: Diagnosis not present

## 2017-07-20 DIAGNOSIS — K740 Hepatic fibrosis: Secondary | ICD-10-CM | POA: Diagnosis not present

## 2017-07-20 DIAGNOSIS — R748 Abnormal levels of other serum enzymes: Secondary | ICD-10-CM

## 2017-07-20 DIAGNOSIS — R945 Abnormal results of liver function studies: Secondary | ICD-10-CM | POA: Insufficient documentation

## 2017-07-20 DIAGNOSIS — K732 Chronic active hepatitis, not elsewhere classified: Secondary | ICD-10-CM | POA: Insufficient documentation

## 2017-07-20 DIAGNOSIS — J45909 Unspecified asthma, uncomplicated: Secondary | ICD-10-CM | POA: Insufficient documentation

## 2017-07-20 DIAGNOSIS — F419 Anxiety disorder, unspecified: Secondary | ICD-10-CM | POA: Diagnosis not present

## 2017-07-20 DIAGNOSIS — E039 Hypothyroidism, unspecified: Secondary | ICD-10-CM | POA: Insufficient documentation

## 2017-07-20 DIAGNOSIS — R740 Nonspecific elevation of levels of transaminase and lactic acid dehydrogenase [LDH]: Secondary | ICD-10-CM | POA: Diagnosis not present

## 2017-07-20 DIAGNOSIS — K759 Inflammatory liver disease, unspecified: Secondary | ICD-10-CM | POA: Diagnosis not present

## 2017-07-20 LAB — CBC
HEMATOCRIT: 34.7 % — AB (ref 36.0–46.0)
Hemoglobin: 11.1 g/dL — ABNORMAL LOW (ref 12.0–15.0)
MCH: 28.5 pg (ref 26.0–34.0)
MCHC: 32 g/dL (ref 30.0–36.0)
MCV: 89.2 fL (ref 78.0–100.0)
Platelets: 193 10*3/uL (ref 150–400)
RBC: 3.89 MIL/uL (ref 3.87–5.11)
RDW: 14.2 % (ref 11.5–15.5)
WBC: 8.4 10*3/uL (ref 4.0–10.5)

## 2017-07-20 LAB — APTT: aPTT: 30 s (ref 24–36)

## 2017-07-20 LAB — PROTIME-INR
INR: 0.97
Prothrombin Time: 12.8 s (ref 11.4–15.2)

## 2017-07-20 MED ORDER — MIDAZOLAM HCL 2 MG/2ML IJ SOLN
INTRAMUSCULAR | Status: AC | PRN
  Administered 2017-07-20 (×2): 1 mg via INTRAVENOUS

## 2017-07-20 MED ORDER — HYDROCODONE-ACETAMINOPHEN 5-325 MG PO TABS
ORAL_TABLET | ORAL | Status: AC
  Administered 2017-07-20: 1 via ORAL
  Filled 2017-07-20: qty 1

## 2017-07-20 MED ORDER — GELATIN ABSORBABLE 12-7 MM EX MISC
CUTANEOUS | Status: AC
  Filled 2017-07-20: qty 1

## 2017-07-20 MED ORDER — FENTANYL CITRATE (PF) 100 MCG/2ML IJ SOLN
INTRAMUSCULAR | Status: AC | PRN
  Administered 2017-07-20: 25 ug via INTRAVENOUS
  Administered 2017-07-20: 50 ug via INTRAVENOUS

## 2017-07-20 MED ORDER — SODIUM CHLORIDE 0.9 % IV SOLN
INTRAVENOUS | Status: AC | PRN
  Administered 2017-07-20: 50 mL/h via INTRAVENOUS

## 2017-07-20 MED ORDER — MIDAZOLAM HCL 2 MG/2ML IJ SOLN
INTRAMUSCULAR | Status: AC
  Filled 2017-07-20: qty 2

## 2017-07-20 MED ORDER — SODIUM CHLORIDE 0.9 % IV SOLN: INTRAVENOUS | Status: DC

## 2017-07-20 MED ORDER — FENTANYL CITRATE (PF) 100 MCG/2ML IJ SOLN
INTRAMUSCULAR | Status: AC
  Filled 2017-07-20: qty 2

## 2017-07-20 MED ORDER — LIDOCAINE HCL (PF) 1 % IJ SOLN
INTRAMUSCULAR | Status: AC
  Filled 2017-07-20: qty 30

## 2017-07-20 MED ORDER — HYDROCODONE-ACETAMINOPHEN 5-325 MG PO TABS
1.0000 | ORAL_TABLET | Freq: Once | ORAL | Status: AC
  Administered 2017-07-20: 1 via ORAL

## 2017-07-20 NOTE — Sedation Documentation (Signed)
O2 d/c'd 

## 2017-07-20 NOTE — Sedation Documentation (Signed)
Dr. Wagner at bedside.

## 2017-07-20 NOTE — Sedation Documentation (Signed)
O2 2l/[place] started 

## 2017-07-20 NOTE — Procedures (Signed)
Interventional Radiology Procedure Note  Procedure: US guided liver lobe biopsy, medical liver.  Complications: None Recommendations:  - 2 hour recovery, then dc home - Do not submerge for 7 days - Routine wound care   Signed,  Dulcy Fanny. Earleen Newport, DO

## 2017-07-20 NOTE — Sedation Documentation (Signed)
Awaiting MD, procedure explained, questions answered, oriented to events, call bell in reach, family at bedside.

## 2017-07-20 NOTE — H&P (Signed)
Chief Complaint: Patient was seen in consultation today for liver core biopsy at the request of Hung,Patrick  Referring Physician(s): Hung,Patrick  Supervising Physician: Corrie Mckusick  Patient Status: [facility] - Out-pt  History of Present Illness: Kimberly Reyes is a 61 y.o. female   Elevated liver functions Looking back---this has been noted since 2016 (minimally) NO Etoh No symptoms Was recently seen by Rheumatologist for jt pain; Labs again revealed much higher LFTs Referred to Dr Benson Norway Now scheduled for liver core biopsy   Past Medical History:  Diagnosis Date  . Anxiety   . Asthma   40 yrs ago   not current probem no inhaler use  . Headache(784.0)   . Hypothyroidism     Past Surgical History:  Procedure Laterality Date  . ABDOMINAL HYSTERECTOMY     20 years ago, complete  . KNEE ARTHROSCOPY W/ MENISCAL REPAIR Right   . TUBAL LIGATION      Allergies: Patient has no known allergies.  Medications: Prior to Admission medications   Medication Sig Start Date End Date Taking? Authorizing Provider  aspirin EC 81 MG tablet Take 81 mg by mouth at bedtime.    Yes [provider]  cyclobenzaprine (FLEXERIL) 10 MG tablet Take 10 mg by mouth 3 (three) times daily as needed for muscle spasms.   Yes [provider]  cycloSPORINE (RESTASIS) 0.05 % ophthalmic emulsion Place 1 drop into both eyes at bedtime.   Yes [provider]  eletriptan (RELPAX) 40 MG tablet Take 40 mg by mouth as needed for migraine. One tablet by mouth at onset of headache. May repeat in 2 hours if headache persists or recurs.   Yes [provider]  furosemide (LASIX) 20 MG tablet Take 20 mg daily by mouth. In the afternoon 05/12/17  Yes [provider]  furosemide (LASIX) 40 MG tablet Take 40 mg daily by mouth. In the morning 06/15/17  Yes [provider]  levothyroxine (SYNTHROID, LEVOTHROID) 88 MCG tablet Take 88 mcg daily before breakfast by  mouth.  06/23/17  Yes [provider]  Multiple Vitamin (MULTIVITAMIN) tablet Take 1 tablet by mouth daily.   Yes [provider]  zolpidem (AMBIEN) 10 MG tablet Take 10 mg at bedtime as needed by mouth for sleep.  05/12/17  Yes [provider]     Family History  Problem Relation Age of Onset  . Colon cancer Father   . CVA Mother   . Pneumonia Brother     Social History   Socioeconomic History  . Marital status: Married    Spouse name: None  . Number of children: None  . Years of education: None  . Highest education level: None  Social Needs  . Financial resource strain: None  . Food insecurity - worry: None  . Food insecurity - inability: None  . Transportation needs - medical: None  . Transportation needs - non-medical: None  Occupational History  . None  Tobacco Use  . Smoking status: Never Smoker  . Smokeless tobacco: Never Used  Substance and Sexual Activity  . Alcohol use: Yes    Comment: very rare  . Drug use: No  . Sexual activity: None  Other Topics Concern  . None  Social History Narrative  . None    Review of Systems: A 12 point ROS discussed and pertinent positives are indicated in the HPI above.  All other systems are negative.  Review of Systems  Constitutional: Negative for activity change, fatigue and  fever.  Respiratory: Negative for shortness of breath.   Cardiovascular: Negative for chest pain.  Gastrointestinal: Positive for abdominal pain.  Neurological: Negative for weakness.  Psychiatric/Behavioral: Negative for behavioral problems and confusion.    Vital Signs: BP (!) 141/93   Pulse 96   Temp 97.9 F (36.6 C) (Oral)   Ht 5\' 9"  (1.753 m)   Wt 179 lb (81.2 kg)   SpO2 100%   BMI 26.43 kg/m   Physical Exam  Constitutional: She is oriented to person, place, and time.  Cardiovascular: Normal rate, regular rhythm and normal heart sounds.  Pulmonary/Chest: Effort normal and breath sounds normal.  Abdominal:  Soft. Bowel sounds are normal.  Musculoskeletal: Normal range of motion.  Neurological: She is alert and oriented to person, place, and time.  Skin: Skin is warm and dry.  Psychiatric: She has a normal mood and affect. Her behavior is normal. Judgment and thought content normal.  Nursing note and vitals reviewed.   Imaging: Xr Hip Unilat W Or W/o Pelvis 2-3 Views Left  Result Date: 06/26/2017 Hip joint space was normal. No SI joint sclerosis or joint space  narrowing was noted. Impression: Normal x-ray of the hip joint.  Xr Hip Unilat W Or W/o Pelvis 2-3 Views Right  Result Date: 06/26/2017 Mild superolateral narrowing was noted. Mild sclerosis was noted. No SI joint narrowing or sclerosis was noted. Impression: These findings are consistent with mild osteoarthritis of the hip joint.  Xr Foot 2 Views Left  Result Date: 06/26/2017 Severe first MTP narrowing and subluxation noted. None of the lower MTP showed narrowing or erosive changes. Mild PIP/DIP narrowing and spurring noted. No intertarsal joint space narrowing was noted.No tibiotalar for subtalar joint space narrowing was noted. Impression: These findings are consistent with osteoarthritis of the foot.  Xr Foot 2 Views Right  Result Date: 06/26/2017 First MTP narrowing, PIP/DIP narrowing was noted. None of the other MTPs or intertarsal joint showed narrowing. No tibiotalar or subtalar joint space narrowing was noted. Impression: These findings are consistent with osteoarthritis of the foot.  Xr Hand 2 View Left  Result Date: 06/26/2017 Severe CMC narrowing noted, PIP narrowing noted. No MCP or intercarpal joint space narrowing was noted. No erosive changes were noted. Impression: These findings are consistent with osteoarthritis of the hand  Xr Hand 2 View Right  Result Date: 06/26/2017 PIP narrowing was noted. No MCP or intercarpal joint space narrowing was noted. Impression: These findings are consistent with mild  osteoarthritis of the hand   Labs:  CBC: Recent Labs    10/16/16 0117 02/07/17 0045 06/26/17 0917  WBC 7.8 7.8 8.3  HGB 11.2* 10.9* 11.0*  HCT 34.0* 33.3* 34.0*  PLT 250 200 210    COAGS: No results for input(s): INR, APTT in the last 8760 hours.  BMP: Recent Labs    10/16/16 0117 02/07/17 0045 06/26/17 0917  NA 134* 136 138  K 3.6 3.6 3.8  CL 100* 101 100  CO2 27 28 31   GLUCOSE 142* 147* 97  BUN 17 9 13   CALCIUM 8.8* 9.1 9.3  CREATININE 0.98 0.95 0.98  GFRNONAA >60 >60 61  GFRAA >60 >60 71    LIVER FUNCTION TESTS: Recent Labs    02/07/17 0045 02/14/17 0938 03/06/17 0721 03/13/17 0710 06/26/17 0917  BILITOT 0.4 0.4 0.6 0.5 0.4  AST 80* 78* 75* 66* 19  ALT 97* 100* 199* 233* 108*  ALKPHOS 120 131* 151* 153*  --   PROT 6.8 6.4 6.7  6.3 7.1  7.1  ALBUMIN 3.4* 3.5* 3.7 3.7  --     TUMOR MARKERS: No results for input(s): AFPTM, CEA, CA199, CHROMGRNA in the last 8760 hours.  Assessment and Plan:  Persistent elevated liver functions Scheduled for liver core biopsy Risks and benefits discussed with the patient including, but not limited to bleeding, infection, damage to adjacent structures or low yield requiring additional tests. All of the patient's questions were answered, patient is agreeable to proceed. Consent signed and in chart.   Thank you for this interesting consult.  I greatly enjoyed meeting Kimberly Reyes and look forward to participating in their care.  A copy of this report was sent to the requesting provider on this date.  Electronically Signed: Lavonia Drafts, PA-C 07/20/2017, 12:25 PM   I spent a total of  30 Minutes   in face to face in clinical consultation, greater than 50% of which was counseling/coordinating care for liver core biopsy

## 2017-07-20 NOTE — Sedation Documentation (Signed)
Patient is resting comfortably. 

## 2017-07-20 NOTE — Discharge Instructions (Signed)
Moderate Conscious Sedation, Adult, Care After °These instructions provide you with information about caring for yourself after your procedure. Your health care provider may also give you more specific instructions. Your treatment has been planned according to current medical practices, but problems sometimes occur. Call your health care provider if you have any problems or questions after your procedure. °What can I expect after the procedure? °After your procedure, it is common: °· To feel sleepy for several hours. °· To feel clumsy and have poor balance for several hours. °· To have poor judgment for several hours. °· To vomit if you eat too soon. ° °Follow these instructions at home: °For at least 24 hours after the procedure: ° °· Do not: °? Participate in activities where you could fall or become injured. °? Drive. °? Use heavy machinery. °? Drink alcohol. °? Take sleeping pills or medicines that cause drowsiness. °? Make important decisions or sign legal documents. °? Take care of children on your own. °· Rest. °Eating and drinking °· Follow the diet recommended by your health care provider. °· If you vomit: °? Drink water, juice, or soup when you can drink without vomiting. °? Make sure you have little or no nausea before eating solid foods. °General instructions °· Have a responsible adult stay with you until you are awake and alert. °· Take over-the-counter and prescription medicines only as told by your health care provider. °· If you smoke, do not smoke without supervision. °· Keep all follow-up visits as told by your health care provider. This is important. °Contact a health care provider if: °· You keep feeling nauseous or you keep vomiting. °· You feel light-headed. °· You develop a rash. °· You have a fever. °Get help right away if: °· You have trouble breathing. °This information is not intended to replace advice given to you by your health care provider. Make sure you discuss any questions you have  with your health care provider. °Document Released: 06/18/2013 Document Revised: 01/31/2016 Document Reviewed: 12/18/2015 °Elsevier Interactive Patient Education © 2018 [employer] ° ° °Liver Biopsy, Care After °These instructions give you information on caring for yourself after your procedure. Your doctor may also give you more specific instructions. Call your doctor if you have any problems or questions after your procedure. °Follow these instructions at home: °· Rest at home for 1-2 days or as told by your doctor. °· Have someone stay with you for at least 24 hours. °· Do not do these things in the first 24 hours: °? Drive. °? Use machinery. °? Take care of other people. °? Sign legal documents. °? Take a bath or shower. °· There are many different ways to close and cover a cut (incision). For example, a cut can be closed with stitches, skin glue, or adhesive strips. Follow your doctor's instructions on: °? Taking care of your cut. °? Changing and removing your bandage (dressing). °? Removing whatever was used to close your cut. °· Do not drink alcohol in the first week. °· Do not lift more than 5 pounds or play contact sports for the first 2 weeks. °· Take medicines only as told by your doctor. For 1 week, do not take medicine that has aspirin in it or medicines like ibuprofen. °· Get your test results. °Contact a doctor if: °· A cut bleeds and leaves more than just a small spot of blood. °· A cut is red, puffs up (swells), or hurts more than before. °· Fluid or something else   comes from a cut. °· A cut smells bad. °· You have a fever or chills. °Get help right away if: °· You have swelling, bloating, or pain in your belly (abdomen). °· You get dizzy or faint. °· You have a rash. °· You feel sick to your stomach (nauseous) or throw up (vomit). °· You have trouble breathing, feel short of breath, or feel faint. °· Your chest hurts. °· You have problems talking or seeing. °· You have trouble balancing or moving  your arms or legs. °This information is not intended to replace advice given to you by your health care provider. Make sure you discuss any questions you have with your health care provider. °Document Released: 06/06/2008 Document Revised: 02/03/2016 Document Reviewed: 10/24/2013 °Elsevier Interactive Patient Education © 2018 Elsevier Inc. ° ° °

## 2017-07-24 DIAGNOSIS — M1711 Unilateral primary osteoarthritis, right knee: Secondary | ICD-10-CM | POA: Diagnosis not present

## 2017-07-31 DIAGNOSIS — M1711 Unilateral primary osteoarthritis, right knee: Secondary | ICD-10-CM | POA: Diagnosis not present

## 2017-08-06 DIAGNOSIS — M7918 Myalgia, other site: Secondary | ICD-10-CM | POA: Insufficient documentation

## 2017-08-06 DIAGNOSIS — M19041 Primary osteoarthritis, right hand: Secondary | ICD-10-CM | POA: Insufficient documentation

## 2017-08-06 DIAGNOSIS — R7989 Other specified abnormal findings of blood chemistry: Secondary | ICD-10-CM | POA: Insufficient documentation

## 2017-08-06 DIAGNOSIS — M19072 Primary osteoarthritis, left ankle and foot: Secondary | ICD-10-CM

## 2017-08-06 DIAGNOSIS — M19042 Primary osteoarthritis, left hand: Secondary | ICD-10-CM

## 2017-08-06 DIAGNOSIS — M19071 Primary osteoarthritis, right ankle and foot: Secondary | ICD-10-CM | POA: Insufficient documentation

## 2017-08-06 DIAGNOSIS — M1611 Unilateral primary osteoarthritis, right hip: Secondary | ICD-10-CM | POA: Insufficient documentation

## 2017-08-06 DIAGNOSIS — R945 Abnormal results of liver function studies: Secondary | ICD-10-CM

## 2017-08-06 NOTE — Progress Notes (Signed)
Office Visit Note  Patient: Kimberly Reyes             Date of Birth: [birth year]           MRN: [id number]             PCP: Sharilyn Sites, MD Referring: Sharilyn Sites, MD Visit Date: 08/08/2017 Occupation: @GUAROCC @    Subjective:  Joint stiffness   History of Present Illness: Kimberly Reyes is a 61 y.o. female with history of polyarthralgias and elevated LFTs. Patient states that she was seen by Dr. Benson Norway. She had liver biopsy the following week. She states that Dr. Almyra Free had second opinion from [employer] and [facility] and confirm the diagnosis of autoimmune hepatitis. She was placed on prednisone 40 mg a day 2 weeks ago. He is following liver functions. He also plans to start her on Imuran for that. She has not had any joint pain or joint swelling. She continues to have some joint stiffness after prolonged sitting. She continues to have some discomfort in her knee joints. She has difficulty climbing the stairs. She's getting Visco supplement injections from Dr. Noemi Chapel currently.  Activities of Daily Living:  Patient reports morning stiffness for 5 minutes.   Patient Reports nocturnal pain.  Difficulty dressing/grooming: Denies Difficulty climbing stairs: Reports Difficulty getting out of chair: Denies Difficulty using hands for taps, buttons, cutlery, and/or writing: Denies   Review of Systems  Constitutional: Positive for fatigue and weight gain. Negative for night sweats, weight loss and weakness.  HENT: Negative for mouth sores, trouble swallowing, trouble swallowing, mouth dryness and nose dryness.   Eyes: Negative for pain, redness, visual disturbance and dryness.  Respiratory: Negative for cough, shortness of breath and difficulty breathing.   Cardiovascular: Negative for chest pain, palpitations, hypertension, irregular heartbeat and swelling in legs/feet.  Gastrointestinal: Positive for constipation. Negative for blood in stool and diarrhea.  Endocrine: Negative for increased  urination.  Genitourinary: Negative for vaginal dryness.  Musculoskeletal: Positive for arthralgias, joint pain and morning stiffness. Negative for joint swelling, myalgias, muscle weakness, muscle tenderness and myalgias.  Skin: Negative for color change, rash, hair loss, skin tightness, ulcers and sensitivity to sunlight.  Allergic/Immunologic: Negative for susceptible to infections.  Neurological: Negative for dizziness, memory loss and night sweats.  Hematological: Negative for swollen glands.  Psychiatric/Behavioral: Positive for sleep disturbance. Negative for depressed mood. The patient is not nervous/anxious.     PMFS History:  Patient Active Problem List   Diagnosis Date Noted  . Elevated LFTs 08/06/2017  . Primary osteoarthritis of right hip 08/06/2017  . Primary osteoarthritis of both feet 08/06/2017  . Primary osteoarthritis of both hands 08/06/2017  . Myofascial pain 08/06/2017  . Hypothyroid 11/25/2014  . Hypothyroidism 11/25/2014    Past Medical History:  Diagnosis Date  . Anxiety   . Asthma   40 yrs ago   not current probem no inhaler use  . Headache(784.0)   . Hypothyroidism     Family History  Problem Relation Age of Onset  . Colon cancer Father   . CVA Mother   . Pneumonia Brother   . Cancer Sister        breast, thyroid   . Heart disease Sister   . Healthy Son    Past Surgical History:  Procedure Laterality Date  . ABDOMINAL HYSTERECTOMY     20 years ago, complete  . CHOLECYSTECTOMY N/A 11/03/2014   Procedure: LAPAROSCOPIC CHOLECYSTECTOMY WITH INTRAOPERATIVE CHOLANGIOGRAM;  Surgeon: Gayland Curry, MD;  Location: [facility] OR[facility];  Service: General;  Laterality: N/A;  . COLONOSCOPY N/A 03/27/2013   Procedure: COLONOSCOPY;  Surgeon: Rogene Houston, MD;  Location: [facility];  Service: Endoscopy;  Laterality: N/A;  1030  . KNEE ARTHROSCOPY W/ MENISCAL REPAIR Right   . TUBAL LIGATION     Social History   Social History Narrative  . Not on file      Objective: Vital Signs: BP 134/78 (BP Location: Left Arm, Patient Position: Sitting, Cuff Size: Normal)   Pulse 76   Resp 18   Ht 5\' 9"  (1.753 m)   Wt 178 lb (80.7 kg)   BMI 26.29 kg/m    Physical Exam  Constitutional: She is oriented to person, place, and time. She appears well-developed and well-nourished.  HENT:  Head: Normocephalic and atraumatic.  Eyes: Conjunctivae and EOM are normal.  Neck: Normal range of motion.  Cardiovascular: Normal rate, regular rhythm, normal heart sounds and intact distal pulses.  Pulmonary/Chest: Effort normal and breath sounds normal.  Abdominal: Soft. Bowel sounds are normal.  Lymphadenopathy:    She has no cervical adenopathy.  Neurological: She is alert and oriented to person, place, and time.  Skin: Skin is warm and dry. Capillary refill takes less than 2 seconds.  Psychiatric: She has a normal mood and affect. Her behavior is normal.  Nursing note and vitals reviewed.    Musculoskeletal Exam: C-spine and thoracic lumbar spine good range of motion. Shoulder joints elbow joints wrist joints are good range of motion. She has mild PIP/DIP thickening in her hands and feet consistent with osteoarthritis. She is some discomfort range of motion of her right hip joint. She is warmth and some swelling in her right knee joint.  CDAI Exam: No CDAI exam completed.    Investigation: No additional findings. 06/26/2017 CBC hemoglobin 11.0, CMP alkaline phosphatase 154 elevated, ALT 108 elevated Component     Latest Ref Rng & Units 06/26/2017  Total Protein     6.1 - 8.1 g/dL 7.1  Albumin ELP     3.8 - 4.8 g/dL 3.9  Alpha 1     0.2 - 0.3 g/dL 0.3  Alpha 2     0.5 - 0.9 g/dL 0.8  Beta Globulin     0.4 - 0.6 g/dL 0.5  Beta 2     0.2 - 0.5 g/dL 0.4  Gamma Globulin     0.8 - 1.7 g/dL 1.2  Abnormal Protein Band1     NONE DETEC g/dL NOTE  SPE Interp.        QUANTIFERON(R)-TB GOLD     NEGATIVE NEGATIVE  Quantiferon Nil Value     IU/mL  0.03  Mitogen-Nil     IU/mL >10.00  Quantiferon Tb Ag Minus Nil Value     IU/mL <0.00  Immunoglobulin A     81 - 463 mg/dL 315  IgG (Immunoglobin G), Serum     694 - 1,618 mg/dL 1,317  IgM, Serum     48 - 271 mg/dL 60  ANCA SCREEN     NEGATIVE NEGATIVE  Myeloperoxidase Abs     AI <1.0  Serine Protease 3     AI <1.0  CK Total     29 - 143 U/L 138  Angiotensin-Converting Enzyme     9 - 67 U/L 109 (H)  Aldolase     < OR = 8.1 U/L 7.6  Immunofix Electr Int      NO MONOCLONAL PROTEIN DETECTED   Imaging: US  Biopsy (liver)  Result Date: 07/20/2017 INDICATION: 61 year old female with a history of transaminitis EXAM: ULTRASOUND-GUIDED BIOPSY OF THE LIVER, MEDICAL LIVER MEDICATIONS: None. ANESTHESIA/SEDATION: Moderate (conscious) sedation was employed during this procedure. A total of Versed 2.0 mg and Fentanyl 75 mcg was administered intravenously. Moderate Sedation Time: 10 minutes. The patient's level of consciousness and vital signs were monitored continuously by radiology nursing throughout the procedure under my direct supervision. FLUOROSCOPY TIME:  None COMPLICATIONS: None PROCEDURE: The procedure, risks, benefits, and alternatives were explained to the patient. Questions regarding the procedure were encouraged and answered. The patient understands and consents to the procedure. Ultrasound survey of the right liver lobe performed with images stored and sent to PACs. The right lower thorax/right upper abdomen was prepped with Betadine in a sterile fashion, and a sterile drape was applied covering the operative field. A sterile gown and sterile gloves were used for the procedure. Local anesthesia was provided with 1% Lidocaine. Once the patient is prepped and draped sterilely and the skin and subcutaneous tissues were generously infiltrated with 1% lidocaine, a small stab incision was made with an 11 blade scalpel. A 17 gauge introducer needle was advanced under ultrasound guidance in an  intercostal location into the right liver lobe. The stylet was removed, and 3 separate 18 gauge core biopsy were retrieved. Samples were placed into formalin for transportation to the lab. Three separate Gel-Foam pledgets were then infused with a small amount of saline for assistance with hemostasis. The needle was removed, and a final ultrasound image was performed. The patient tolerated the procedure well and remained hemodynamically stable throughout. No complications were encountered and no significant blood loss was encounter. IMPRESSION: Status post ultrasound-guided biopsy of the liver. Tissue specimen sent to pathology for complete histopathologic analysis. Signed, Dulcy Fanny. Earleen Newport, DO [facility] Electronically Signed   By: Corrie Mckusick D.O.   On: 07/20/2017 15:20    Speciality Comments: No specialty comments available.    Procedures:  No procedures performed Allergies: Patient has no known allergies.   Assessment / Plan:     Visit Diagnoses: Polyarthralgia - All autoimmune workup negative. Patient is no synovitis on examination.  Myofascial pain: She has some muscle tightness which she relates to her work.  Primary osteoarthritis of both hands: She has mild DIP PIP thickening.: Proper fitting shoes were discussed.  Primary osteoarthritis of both feet  Primary osteoarthritis of right hip - mild  Primary osteoarthritis of both knees: She continues to have some discomfort in her knee joints. Her right knee joint has some swelling and warmth. She's been seeing Dr. Para March and has been getting Visco supplement injections.  Elevated LFTs - Followed up by Dr. Benson Norway. Diagnosed with autoimmune hepatitis. She's on prednisone 40 mg a day. She states Dr. Benson Norway plans to start her on Imuran in near future.  Other kyphosis of thoracic region - Patient was supposed to get DXA with Dr. Hilma Favors. Patient was advised to take calcium and vitamin  D on regular basis.    Orders: No orders of the defined types were placed in this encounter.  No orders of the defined types were placed in this encounter.   Face-to-face time spent with patient was 30 minutes. Greater than 50% of time was spent in counseling and coordination of care.  Follow-Up Instructions: Return in about 6 months (around 02/05/2018) for Osteoarthritis.   Bo Merino, MD  Note - This record has been created using Editor, commissioning.  Chart  creation errors have been sought, but may not always  have been located. Such creation errors do not reflect on  the standard of medical care.

## 2017-08-08 ENCOUNTER — Encounter: Payer: Self-pay | Admitting: Rheumatology

## 2017-08-08 ENCOUNTER — Ambulatory Visit: Payer: 59 | Admitting: Rheumatology

## 2017-08-08 VITALS — BP 134/78 | HR 76 | Resp 18 | Ht 69.0 in | Wt 178.0 lb

## 2017-08-08 DIAGNOSIS — M19071 Primary osteoarthritis, right ankle and foot: Secondary | ICD-10-CM | POA: Diagnosis not present

## 2017-08-08 DIAGNOSIS — M19042 Primary osteoarthritis, left hand: Secondary | ICD-10-CM

## 2017-08-08 DIAGNOSIS — M7918 Myalgia, other site: Secondary | ICD-10-CM

## 2017-08-08 DIAGNOSIS — M170 Bilateral primary osteoarthritis of knee: Secondary | ICD-10-CM

## 2017-08-08 DIAGNOSIS — M1611 Unilateral primary osteoarthritis, right hip: Secondary | ICD-10-CM | POA: Diagnosis not present

## 2017-08-08 DIAGNOSIS — R945 Abnormal results of liver function studies: Secondary | ICD-10-CM

## 2017-08-08 DIAGNOSIS — M19041 Primary osteoarthritis, right hand: Secondary | ICD-10-CM

## 2017-08-08 DIAGNOSIS — M40294 Other kyphosis, thoracic region: Secondary | ICD-10-CM | POA: Diagnosis not present

## 2017-08-08 DIAGNOSIS — R7989 Other specified abnormal findings of blood chemistry: Secondary | ICD-10-CM

## 2017-08-08 DIAGNOSIS — M19072 Primary osteoarthritis, left ankle and foot: Secondary | ICD-10-CM | POA: Diagnosis not present

## 2017-08-08 DIAGNOSIS — M2550 Pain in unspecified joint: Secondary | ICD-10-CM

## 2017-08-08 NOTE — Patient Instructions (Signed)
Please reschedule a bone density with Dr. Armandina Gemma for evaluation of bone mass as you're taking prednisone.

## 2017-08-14 DIAGNOSIS — M1711 Unilateral primary osteoarthritis, right knee: Secondary | ICD-10-CM | POA: Diagnosis not present

## 2017-08-15 ENCOUNTER — Ambulatory Visit (INDEPENDENT_AMBULATORY_CARE_PROVIDER_SITE_OTHER): Payer: 59 | Admitting: Internal Medicine

## 2017-08-22 DIAGNOSIS — K754 Autoimmune hepatitis: Secondary | ICD-10-CM | POA: Diagnosis not present

## 2017-08-24 DIAGNOSIS — Z23 Encounter for immunization: Secondary | ICD-10-CM | POA: Diagnosis not present

## 2017-09-10 ENCOUNTER — Other Ambulatory Visit (HOSPITAL_COMMUNITY): Payer: Self-pay | Admitting: Family Medicine

## 2017-09-10 DIAGNOSIS — E2839 Other primary ovarian failure: Secondary | ICD-10-CM

## 2017-09-10 DIAGNOSIS — Z780 Asymptomatic menopausal state: Secondary | ICD-10-CM

## 2017-09-19 ENCOUNTER — Ambulatory Visit (HOSPITAL_COMMUNITY)
Admission: RE | Admit: 2017-09-19 | Discharge: 2017-09-19 | Disposition: A | Discharge status: Home / Self Care | Payer: 59 | Source: Ambulatory Visit | Attending: Family Medicine | Admitting: Family Medicine

## 2017-09-19 DIAGNOSIS — M8588 Other specified disorders of bone density and structure, other site: Secondary | ICD-10-CM | POA: Insufficient documentation

## 2017-09-19 DIAGNOSIS — Z780 Asymptomatic menopausal state: Secondary | ICD-10-CM | POA: Diagnosis not present

## 2017-09-19 DIAGNOSIS — M85851 Other specified disorders of bone density and structure, right thigh: Secondary | ICD-10-CM | POA: Insufficient documentation

## 2017-09-19 DIAGNOSIS — M546 Pain in thoracic spine: Secondary | ICD-10-CM | POA: Diagnosis not present

## 2017-09-19 DIAGNOSIS — E2839 Other primary ovarian failure: Secondary | ICD-10-CM | POA: Diagnosis present

## 2017-09-19 DIAGNOSIS — R1013 Epigastric pain: Secondary | ICD-10-CM | POA: Diagnosis not present

## 2017-09-19 DIAGNOSIS — M8589 Other specified disorders of bone density and structure, multiple sites: Secondary | ICD-10-CM | POA: Diagnosis not present

## 2017-09-19 DIAGNOSIS — Z13820 Encounter for screening for osteoporosis: Secondary | ICD-10-CM | POA: Insufficient documentation

## 2017-09-19 DIAGNOSIS — Z1389 Encounter for screening for other disorder: Secondary | ICD-10-CM | POA: Diagnosis not present

## 2017-09-20 ENCOUNTER — Other Ambulatory Visit (HOSPITAL_COMMUNITY): Payer: Self-pay | Admitting: Physician Assistant

## 2017-09-20 ENCOUNTER — Ambulatory Visit (HOSPITAL_COMMUNITY)
Admission: RE | Admit: 2017-09-20 | Discharge: 2017-09-20 | Disposition: A | Discharge status: Home / Self Care | Payer: 59 | Source: Ambulatory Visit | Attending: Physician Assistant | Admitting: Physician Assistant

## 2017-09-20 DIAGNOSIS — R39198 Other difficulties with micturition: Secondary | ICD-10-CM | POA: Diagnosis not present

## 2017-09-20 DIAGNOSIS — R509 Fever, unspecified: Secondary | ICD-10-CM | POA: Insufficient documentation

## 2017-09-20 DIAGNOSIS — M546 Pain in thoracic spine: Secondary | ICD-10-CM | POA: Diagnosis not present

## 2017-10-10 DIAGNOSIS — Z1231 Encounter for screening mammogram for malignant neoplasm of breast: Secondary | ICD-10-CM | POA: Diagnosis not present

## 2017-10-10 DIAGNOSIS — Z803 Family history of malignant neoplasm of breast: Secondary | ICD-10-CM | POA: Diagnosis not present

## 2017-10-24 DIAGNOSIS — G43709 Chronic migraine without aura, not intractable, without status migrainosus: Secondary | ICD-10-CM | POA: Diagnosis not present

## 2017-11-17 DIAGNOSIS — J0100 Acute maxillary sinusitis, unspecified: Secondary | ICD-10-CM | POA: Diagnosis not present

## 2017-11-21 DIAGNOSIS — K754 Autoimmune hepatitis: Secondary | ICD-10-CM | POA: Diagnosis not present

## 2017-11-27 DIAGNOSIS — M1711 Unilateral primary osteoarthritis, right knee: Secondary | ICD-10-CM | POA: Diagnosis not present

## 2017-12-20 DIAGNOSIS — E039 Hypothyroidism, unspecified: Secondary | ICD-10-CM | POA: Diagnosis not present

## 2017-12-27 DIAGNOSIS — E039 Hypothyroidism, unspecified: Secondary | ICD-10-CM | POA: Diagnosis not present

## 2017-12-27 DIAGNOSIS — M8580 Other specified disorders of bone density and structure, unspecified site: Secondary | ICD-10-CM | POA: Diagnosis not present

## 2018-01-07 DIAGNOSIS — R1011 Right upper quadrant pain: Secondary | ICD-10-CM | POA: Diagnosis not present

## 2018-01-07 DIAGNOSIS — R740 Nonspecific elevation of levels of transaminase and lactic acid dehydrogenase [LDH]: Secondary | ICD-10-CM | POA: Diagnosis not present

## 2018-01-08 ENCOUNTER — Other Ambulatory Visit: Payer: Self-pay | Admitting: Gastroenterology

## 2018-01-08 DIAGNOSIS — R945 Abnormal results of liver function studies: Principal | ICD-10-CM

## 2018-01-08 DIAGNOSIS — R7989 Other specified abnormal findings of blood chemistry: Secondary | ICD-10-CM

## 2018-01-16 ENCOUNTER — Ambulatory Visit (HOSPITAL_COMMUNITY)
Admission: RE | Admit: 2018-01-16 | Discharge: 2018-01-16 | Disposition: A | Discharge status: Home / Self Care | Payer: 59 | Source: Ambulatory Visit | Attending: Gastroenterology | Admitting: Gastroenterology

## 2018-01-16 DIAGNOSIS — R7989 Other specified abnormal findings of blood chemistry: Secondary | ICD-10-CM | POA: Insufficient documentation

## 2018-01-16 DIAGNOSIS — K754 Autoimmune hepatitis: Secondary | ICD-10-CM | POA: Diagnosis not present

## 2018-01-16 DIAGNOSIS — Z9049 Acquired absence of other specified parts of digestive tract: Secondary | ICD-10-CM | POA: Diagnosis not present

## 2018-01-24 NOTE — Progress Notes (Signed)
Office Visit Note  Patient: Kimberly Reyes             Date of Birth: 13-Jan-1956           MRN: [id number]             PCP: Sharilyn Sites, MD Referring: Sharilyn Sites, MD Visit Date: 02/06/2018 Occupation: @GUAROCC @    Subjective:  Knee pain    History of Present Illness: Kimberly Reyes is a 62 y.o. female with history of myofascial pain syndrome and osteoarthritis.  She continues to see Dr. Benson Norway on a regular basis for management of autoimmune hepatitis.  She is currently on Imuran 50 mg po daily and Prednisone 5 mg daily.  She reports he continues to monitor her labs closely.  She states she continues to try to lose weight and she has lost about 9 lbs.  She states she continues to have chronic pain in bilateral knee joints.  She states she has swelling in bilateral knee joints, especially in the right knee.  She states she occasionally wears a right knee brace and alternates heat and ice.  She receives visco knee injections by Dr. Noemi Chapel every 6 months. She states she continues to have stiffness in bilateral hands.  She states her right hip causes occasional discomfort if she is walking for a prolonged period of time.     Activities of Daily Living:  Patient reports morning stiffness for 30  minutes.   Patient Denies nocturnal pain.  Difficulty dressing/grooming: Denies Difficulty climbing stairs: Reports Difficulty getting out of chair: Denies Difficulty using hands for taps, buttons, cutlery, and/or writing: Reports   Review of Systems  Constitutional: Positive for fatigue.  HENT: Positive for mouth dryness. Negative for mouth sores and nose dryness.   Eyes: Positive for dryness (Use Restasis daily ). Negative for pain and visual disturbance.  Respiratory: Negative for cough, hemoptysis, shortness of breath and difficulty breathing.   Cardiovascular: Negative for chest pain, palpitations, hypertension and swelling in legs/feet.  Gastrointestinal: Positive for constipation.  Negative for blood in stool and diarrhea.  Endocrine: Negative for increased urination.  Genitourinary: Negative for painful urination.  Musculoskeletal: Positive for arthralgias, joint pain, joint swelling and morning stiffness. Negative for myalgias, muscle weakness, muscle tenderness and myalgias.  Skin: Negative for color change, pallor, rash, hair loss, nodules/bumps, skin tightness, ulcers and sensitivity to sunlight.  Allergic/Immunologic: Negative for susceptible to infections.  Neurological: Positive for headaches (HX of migraine). Negative for dizziness, numbness and weakness.  Hematological: Negative for swollen glands.  Psychiatric/Behavioral: Positive for sleep disturbance. Negative for depressed mood. The patient is not nervous/anxious.     PMFS History:  Patient Active Problem List   Diagnosis Date Noted  . Elevated LFTs 08/06/2017  . Primary osteoarthritis of right hip 08/06/2017  . Primary osteoarthritis of both feet 08/06/2017  . Primary osteoarthritis of both hands 08/06/2017  . Myofascial pain 08/06/2017  . Hypothyroid 11/25/2014  . Hypothyroidism 11/25/2014    Past Medical History:  Diagnosis Date  . Anxiety   . Asthma   40 yrs ago   not current probem no inhaler use  . Headache(784.0)   . Hypothyroidism     Family History  Problem Relation Age of Onset  . Colon cancer Father   . CVA Mother   . Pneumonia Brother   . Cancer Sister        breast, thyroid   . Heart disease Sister   . Healthy Son  Past Surgical History:  Procedure Laterality Date  . ABDOMINAL HYSTERECTOMY     20 years ago, complete  . CHOLECYSTECTOMY N/A 11/03/2014   Procedure: LAPAROSCOPIC CHOLECYSTECTOMY WITH INTRAOPERATIVE CHOLANGIOGRAM;  Surgeon: Gayland Curry, MD;  Location: [facility];  Service: General;  Laterality: N/A;  . COLONOSCOPY N/A 03/27/2013   Procedure: COLONOSCOPY;  Surgeon: Rogene Houston, MD;  Location: [facility];  Service: Endoscopy;  Laterality: N/A;  1030  .  KNEE ARTHROSCOPY W/ MENISCAL REPAIR Right   . TUBAL LIGATION     Social History   Social History Narrative  . Not on file     Objective: Vital Signs: BP 122/75 (BP Location: Left Arm, Patient Position: Sitting, Cuff Size: Normal)   Pulse 97   Resp 15   Ht 5\' 8"  (1.727 m)   Wt 180 lb (81.6 kg)   BMI 27.37 kg/m    Physical Exam  Constitutional: She is oriented to person, place, and time. She appears well-developed and well-nourished.  HENT:  Head: Normocephalic and atraumatic.  Eyes: Conjunctivae and EOM are normal.  Neck: Normal range of motion.  Cardiovascular: Normal rate, regular rhythm, normal heart sounds and intact distal pulses.  Pulmonary/Chest: Effort normal and breath sounds normal.  Abdominal: Soft. Bowel sounds are normal.  Lymphadenopathy:    She has no cervical adenopathy.  Neurological: She is alert and oriented to person, place, and time.  Skin: Skin is warm and dry. Capillary refill takes less than 2 seconds.  Psychiatric: She has a normal mood and affect. Her behavior is normal.  Nursing note and vitals reviewed.    Musculoskeletal Exam: C-spine and lumbar spine good ROM.  Thoracic kyphosis.  No midline spinal tenderness.  No SI joint tenderness.  Shoulder joints, elbow joints, wrist joints, MCPs, PIPs, and DIPs good ROM with no synovitis.  PIP and DIP synovial thickening consistent with osteoarthritis.  Hip joints, knee joints, ankle joints, MTPs, PIPs, and DIPs good ROM with no synovitis.  She has warmth of right knee.  Bilateral knee crepitus.  No tenderness of trochanteric bursa.    CDAI Exam: No CDAI exam completed.    Investigation: No additional findings. CBC Latest Ref Rng & Units 07/20/2017 06/26/2017 02/07/2017  WBC 4.0 - 10.5 K/uL 8.4 8.3 7.8  Hemoglobin 12.0 - 15.0 g/dL 11.1(L) 11.0(L) 10.9(L)  Hematocrit 36.0 - 46.0 % 34.7(L) 34.0(L) 33.3(L)  Platelets 150 - 400 K/uL 193 210 200   CMP Latest Ref Rng & Units 06/26/2017 06/26/2017 03/13/2017    Glucose 65 - 99 mg/dL - 97 -  BUN 7 - 25 mg/dL - 13 -  Creatinine 0.50 - 0.99 mg/dL - 0.98 -  Sodium 135 - 146 mmol/L - 138 -  Potassium 3.5 - 5.3 mmol/L - 3.8 -  Chloride 98 - 110 mmol/L - 100 -  CO2 20 - 32 mmol/L - 31 -  Calcium 8.6 - 10.4 mg/dL - 9.3 -  Total Protein 6.1 - 8.1 g/dL 7.1 7.1 6.3  Total Bilirubin 0.2 - 1.2 mg/dL - 0.4 0.5  Alkaline Phos 33 - 130 U/L - - 153(H)  AST 10 - 35 U/L - 19 66(H)  ALT 6 - 29 U/L - 108(H) 233(H)    Imaging: US Abdomen Limited Ruq  Result Date: 01/16/2018 CLINICAL DATA:  Right flank pain for 2 weeks.  Autoimmune hepatitis. EXAM: ULTRASOUND ABDOMEN LIMITED RIGHT UPPER QUADRANT COMPARISON:  Ultrasound abdomen 03/21/2017 and CT [facility] 10/16/2016 FINDINGS: Gallbladder: Previous cholecystectomy. Common bile duct: Diameter: The  common bile duct is mildly increased in caliber measuring 8.8 mm in diameter. Previously 7 mm. Liver: No focal lesion identified. Within normal limits in parenchymal echogenicity. Portal vein is patent on color Doppler imaging with normal direction of blood flow towards the liver. IMPRESSION: 1. No acute abnormality. 2. Mild increase caliber of common bile duct status post cholecystectomy. Electronically Signed   By: Kerby Moors M.D.   On: 01/16/2018 12:58    Speciality Comments: No specialty comments available.    Procedures:  No procedures performed Allergies: Patient has no known allergies.   Assessment / Plan:     Visit Diagnoses: Polyarthralgia - All autoimmune workup negative.   Myofascial pain: She has no muscle aches or muscle tenderness.  She has been doing well.   Primary osteoarthritis of both hands: She has PIP and DIP synovial thickening consistent with osteoarthritis.  She has been having increased hand stiffness.  Joint protection and muscle strengthening were discussed.    Primary osteoarthritis of right hip:  She has good right hip ROM with no discomfort at this time.   Primary osteoarthritis of both  knees: She has right knee warmth.  She has bilateral knee crepitus.  She has good ROM on exam.  She has been undergoing Visco supplementation injections every 6 months.  She is due next month.  She sees Dr. Noemi Chapel.   Primary osteoarthritis of both feet: She has PIP and DIP synovial thickening consistent with osteoarthritis of both feet.  She wears proper fitting shoes.    Elevated LFTs -She is followed by Dr. Benson Norway for management of autoimmune hepatitis.  She is taking Imuran 50 mg po daily and Prednisone 5 mg po daily.  He continues to monitor her lab work closely.    Other kyphosis of thoracic region - She had a DEXA scan 09/19/17.  T score of -2.3. she has no midline spinal tenderness.      Orders: No orders of the defined types were placed in this encounter.  No orders of the defined types were placed in this encounter.   Face-to-face time spent with patient was 30 minutes. >50% of time was spent in counseling and coordination of care.  Follow-Up Instructions: Return for Myofascial pain syndrome , Osteoarthritis.   Ofilia Neas, PA-C   I examined and evaluated the patient with Hazel Sams PA.  Patient is clinically doing much better.  She is a started Imuran and is tapering prednisone.  She will be followed by Dr. Benson Norway. The plan of care was discussed as noted above.  Bo Merino, MD  Note - This record has been created using Editor, commissioning.  Chart creation errors have been sought, but may not always  have been located. Such creation errors do not reflect on  the standard of medical care.

## 2018-02-06 ENCOUNTER — Ambulatory Visit: Payer: 59 | Admitting: Rheumatology

## 2018-02-06 ENCOUNTER — Encounter: Payer: Self-pay | Admitting: Rheumatology

## 2018-02-06 VITALS — BP 122/75 | HR 97 | Resp 15 | Ht 68.0 in | Wt 180.0 lb

## 2018-02-06 DIAGNOSIS — M19072 Primary osteoarthritis, left ankle and foot: Secondary | ICD-10-CM

## 2018-02-06 DIAGNOSIS — M170 Bilateral primary osteoarthritis of knee: Secondary | ICD-10-CM

## 2018-02-06 DIAGNOSIS — M19071 Primary osteoarthritis, right ankle and foot: Secondary | ICD-10-CM | POA: Diagnosis not present

## 2018-02-06 DIAGNOSIS — M19041 Primary osteoarthritis, right hand: Secondary | ICD-10-CM | POA: Diagnosis not present

## 2018-02-06 DIAGNOSIS — M7918 Myalgia, other site: Secondary | ICD-10-CM

## 2018-02-06 DIAGNOSIS — M1611 Unilateral primary osteoarthritis, right hip: Secondary | ICD-10-CM | POA: Diagnosis not present

## 2018-02-06 DIAGNOSIS — M19042 Primary osteoarthritis, left hand: Secondary | ICD-10-CM

## 2018-02-06 DIAGNOSIS — R945 Abnormal results of liver function studies: Secondary | ICD-10-CM | POA: Diagnosis not present

## 2018-02-06 DIAGNOSIS — M40294 Other kyphosis, thoracic region: Secondary | ICD-10-CM

## 2018-02-06 DIAGNOSIS — M2550 Pain in unspecified joint: Secondary | ICD-10-CM

## 2018-02-06 DIAGNOSIS — R7989 Other specified abnormal findings of blood chemistry: Secondary | ICD-10-CM

## 2018-02-12 DIAGNOSIS — M1711 Unilateral primary osteoarthritis, right knee: Secondary | ICD-10-CM | POA: Diagnosis not present

## 2018-02-19 DIAGNOSIS — M1711 Unilateral primary osteoarthritis, right knee: Secondary | ICD-10-CM | POA: Diagnosis not present

## 2018-02-20 DIAGNOSIS — M810 Age-related osteoporosis without current pathological fracture: Secondary | ICD-10-CM | POA: Diagnosis not present

## 2018-02-21 DIAGNOSIS — K754 Autoimmune hepatitis: Secondary | ICD-10-CM | POA: Diagnosis not present

## 2018-02-26 DIAGNOSIS — M1711 Unilateral primary osteoarthritis, right knee: Secondary | ICD-10-CM | POA: Diagnosis not present

## 2018-03-15 ENCOUNTER — Encounter (INDEPENDENT_AMBULATORY_CARE_PROVIDER_SITE_OTHER): Payer: Self-pay | Admitting: *Deleted

## 2018-03-29 ENCOUNTER — Other Ambulatory Visit (INDEPENDENT_AMBULATORY_CARE_PROVIDER_SITE_OTHER): Payer: Self-pay | Admitting: *Deleted

## 2018-03-29 DIAGNOSIS — Z800 Family history of malignant neoplasm of digestive organs: Secondary | ICD-10-CM | POA: Insufficient documentation

## 2018-04-05 IMAGING — US US BIOPSY CORE LIVER
1 series · 6 of 6 positions shown · non-contrast
Comparison: none

INDICATION: 61-year-old female with a history of transaminitis

[Series 1: us biopsy core liver · 0.17mm/px · 6 of 6 slices shown]
[im 1/6]
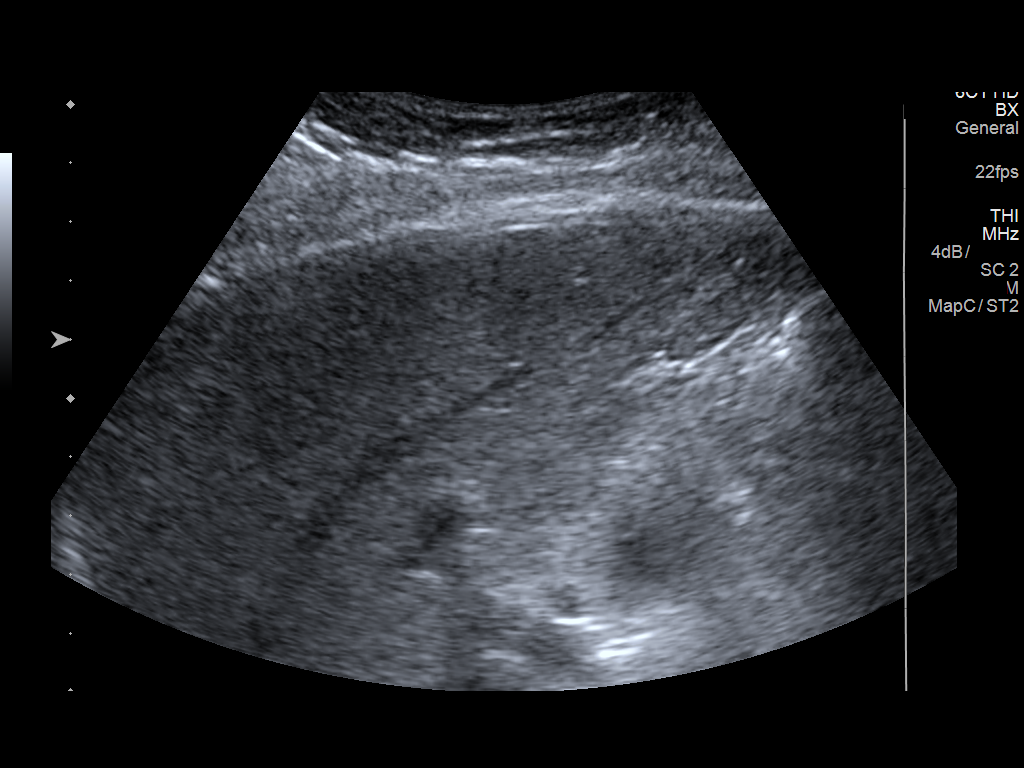
[im 2/6]
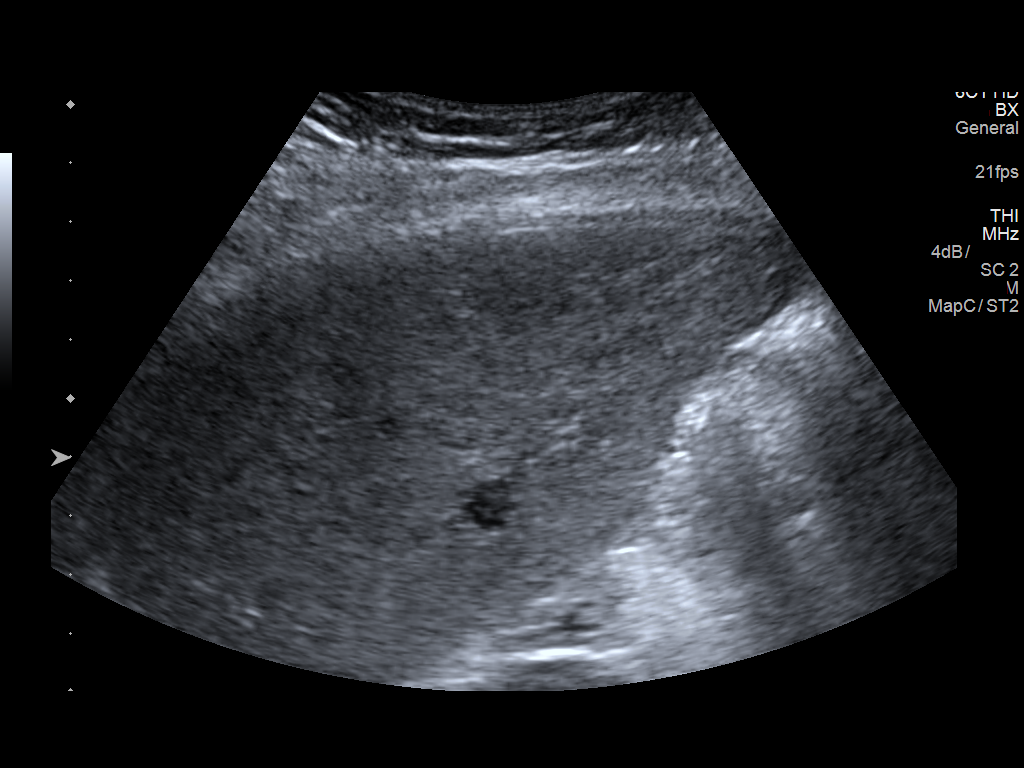
[im 3/6]
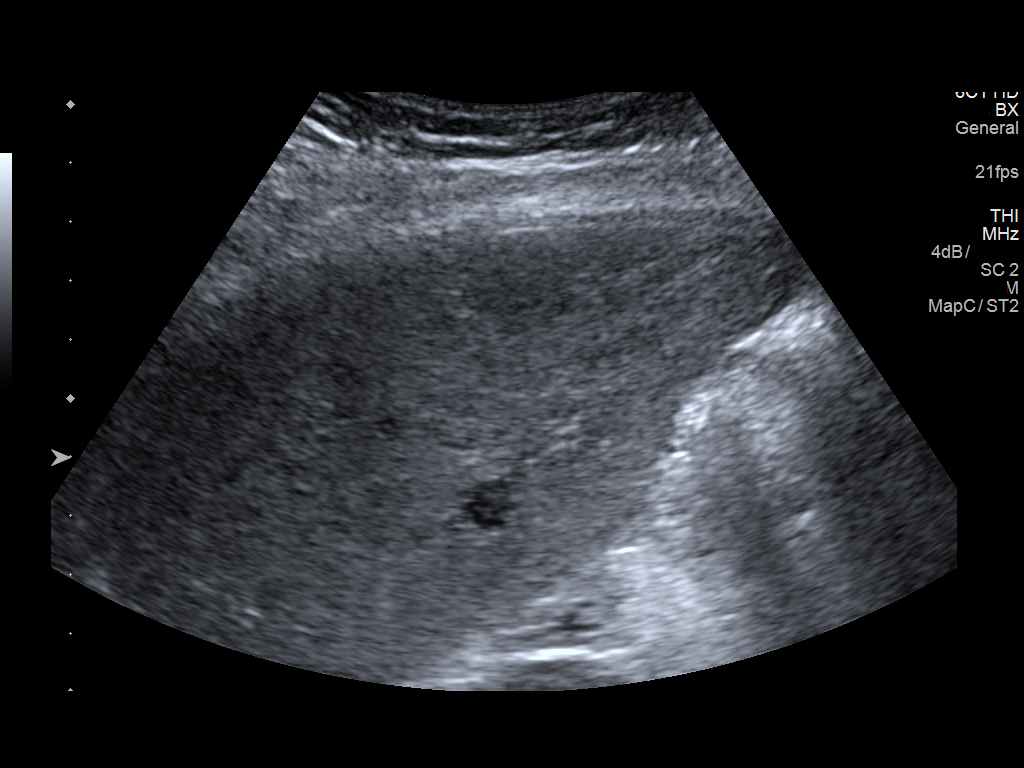
[im 4/6]
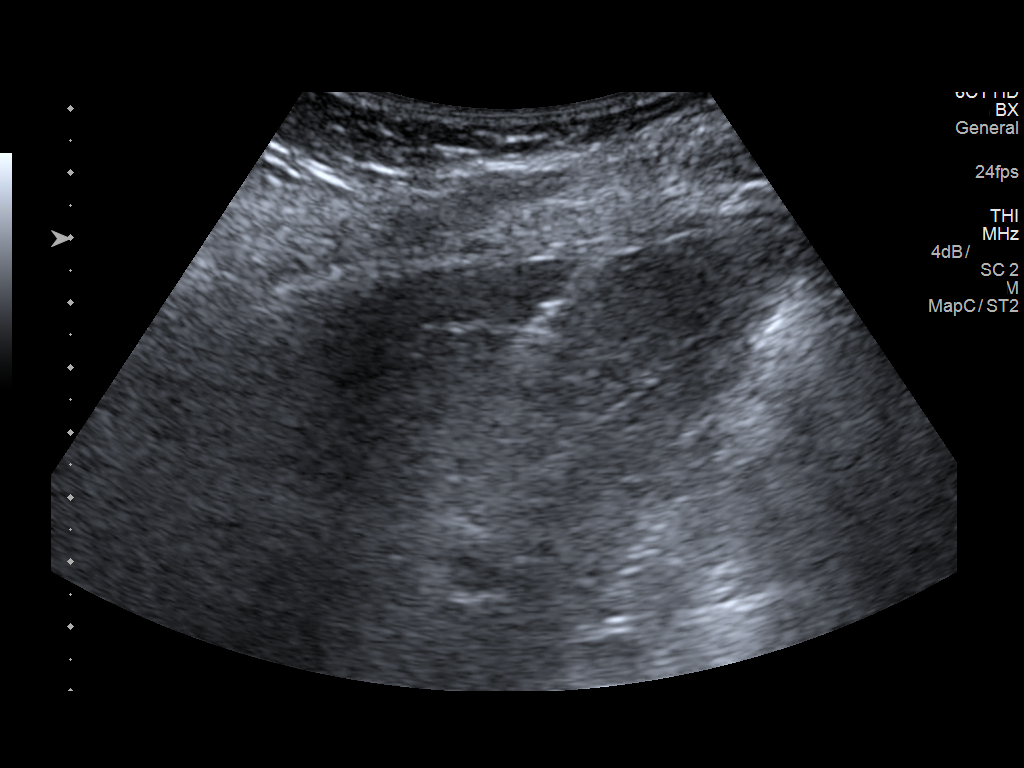
[im 5/6]
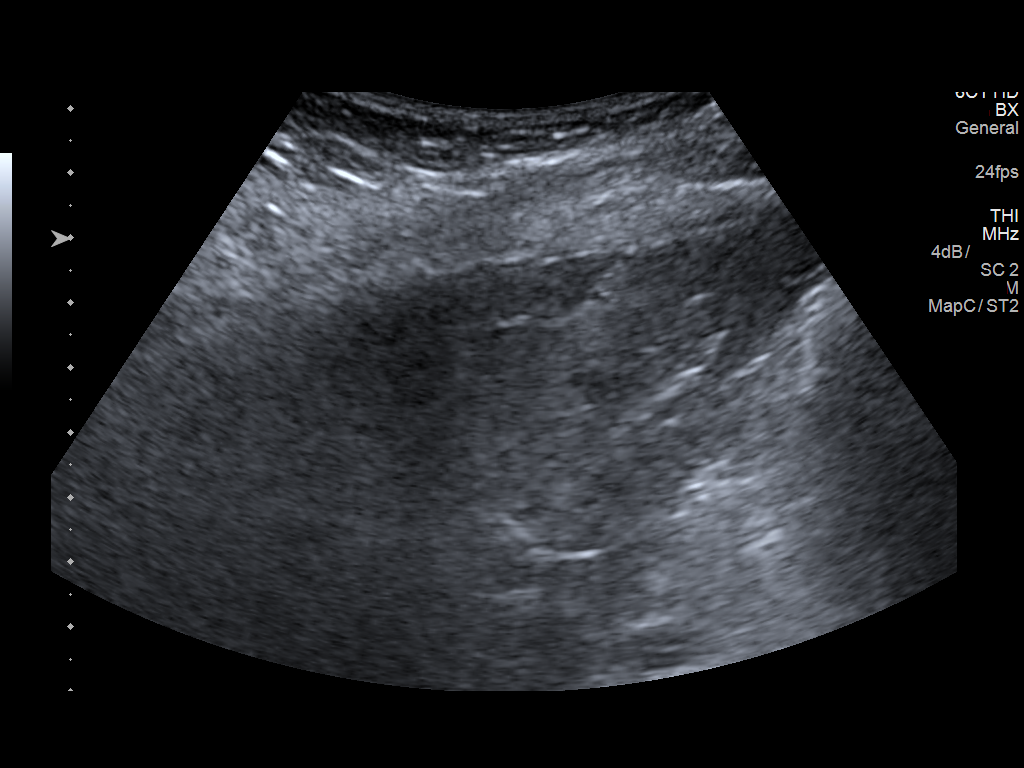
[im 6/6]
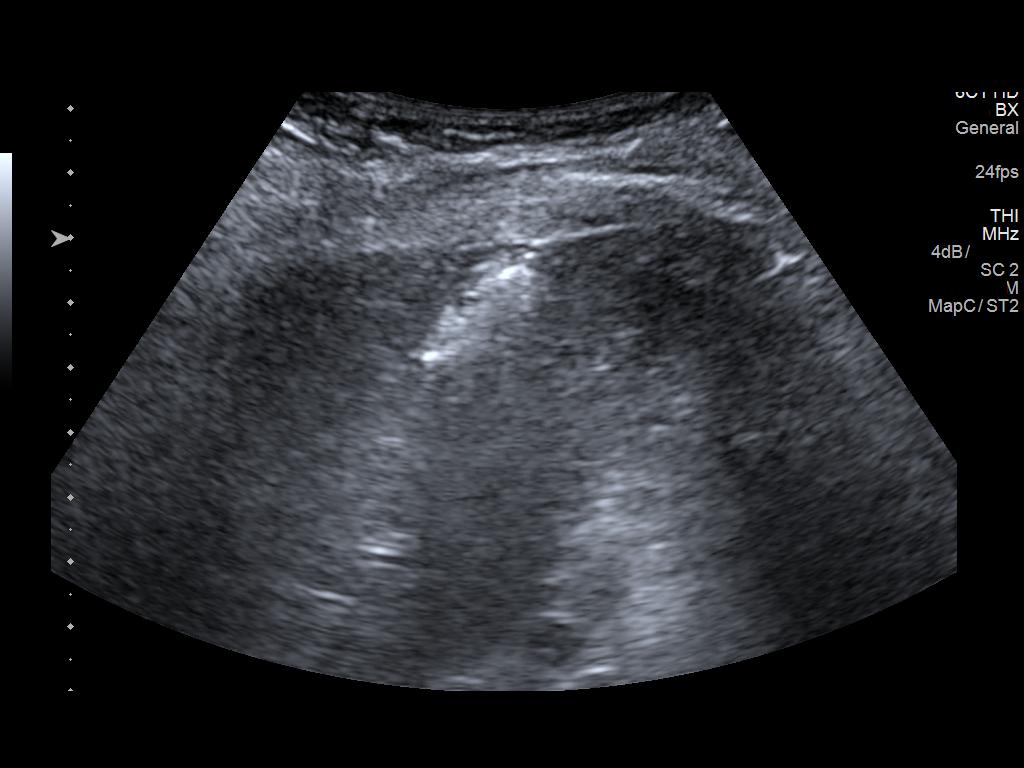

[6 of 6 positions shown; findings below may reference images not displayed]

EXAM:
ULTRASOUND-GUIDED BIOPSY OF THE LIVER, MEDICAL LIVER

MEDICATIONS:
None.

ANESTHESIA/SEDATION:
Moderate (conscious) sedation was employed during this procedure. A
total of Versed 2.0 mg and Fentanyl 75 mcg was administered
intravenously.

Moderate Sedation Time: 10 minutes. The patient's level of
consciousness and vital signs were monitored continuously by
radiology nursing throughout the procedure under my direct
supervision.

FLUOROSCOPY TIME:  None

COMPLICATIONS:
None

PROCEDURE:
The procedure, risks, benefits, and alternatives were explained to
the patient. Questions regarding the procedure were encouraged and
answered. The patient understands and consents to the procedure.

Ultrasound survey of the right liver lobe performed with images
stored and sent to PACs.

The right lower thorax/right upper abdomen was prepped with Betadine
in a sterile fashion, and a sterile drape was applied covering the
operative field. A sterile gown and sterile gloves were used for the
procedure. Local anesthesia was provided with 1% Lidocaine.

Once the patient is prepped and draped sterilely and the skin and
subcutaneous tissues were generously infiltrated with 1% lidocaine,
a small stab incision was made with an 11 blade scalpel.

A 17 gauge introducer needle was advanced under ultrasound guidance
in an intercostal location into the right liver lobe. The stylet was
removed, and 3 separate 18 gauge core biopsy were retrieved. Samples
were placed into formalin for transportation to the lab.

Three separate Gel-Foam pledgets were then infused with a small
amount of saline for assistance with hemostasis.

The needle was removed, and a final ultrasound image was performed.

The patient tolerated the procedure well and remained
hemodynamically stable throughout.

No complications were encountered and no significant blood loss was
encounter.
IMPRESSION: Status post ultrasound-guided biopsy of the liver. Tissue specimen
sent to pathology for complete histopathologic analysis.

## 2018-04-17 ENCOUNTER — Telehealth (INDEPENDENT_AMBULATORY_CARE_PROVIDER_SITE_OTHER): Payer: Self-pay | Admitting: *Deleted

## 2018-04-17 ENCOUNTER — Encounter (INDEPENDENT_AMBULATORY_CARE_PROVIDER_SITE_OTHER): Payer: Self-pay | Admitting: *Deleted

## 2018-04-17 MED ORDER — PEG 3350-KCL-NA BICARB-NACL 420 G PO SOLR: 4000.0000 mL | Freq: Once | ORAL | 0 refills | Status: AC

## 2018-04-17 NOTE — Telephone Encounter (Signed)
Patient needs trilyte 

## 2018-04-18 DIAGNOSIS — L299 Pruritus, unspecified: Secondary | ICD-10-CM | POA: Diagnosis not present

## 2018-04-18 DIAGNOSIS — W64XXXA Exposure to other animate mechanical forces, initial encounter: Secondary | ICD-10-CM | POA: Diagnosis not present

## 2018-05-06 ENCOUNTER — Telehealth (INDEPENDENT_AMBULATORY_CARE_PROVIDER_SITE_OTHER): Payer: Self-pay | Admitting: *Deleted

## 2018-05-06 NOTE — Telephone Encounter (Signed)
Referring MD/PCP: goldign   Procedure: tcs  Reason/Indication:  fam hx colon ca  Has patient had this procedure before?  Yes, 03/2013  If so, when, by whom and where?    Is there a family history of colon cancer?  Yes, father  Who?  What age when diagnosed?    Is patient diabetic?   no      Does patient have prosthetic heart valve or mechanical valve?  no  Do you have a pacemaker?  no  Has patient ever had endocarditis? no  Has patient had joint replacement within last 12 months?  no  Is patient constipated or do they take laxatives? no  Does patient have a history of alcohol/drug use?  no  Is patient on blood thinner such as Coumadin, Plavix and/or Aspirin? no  Medications: azathioprine 50 mg daily, reclast injection once a year, flexeril 10 mg bid, prednisone 5 mg daily, furosemide 40 mg daily, MVI daily, calcium 1200 mg + Vit D3 daily, magnesium 250 m daily, turmeric 500 mg daily  Allergies: nkda  Medication Adjustment per Dr Lindi Adie, NP:   Procedure date & time: 06/05/18 at 1025

## 2018-05-06 NOTE — Telephone Encounter (Signed)
agree

## 2018-05-22 DIAGNOSIS — K754 Autoimmune hepatitis: Secondary | ICD-10-CM | POA: Diagnosis not present

## 2018-06-05 ENCOUNTER — Other Ambulatory Visit: Payer: Self-pay

## 2018-06-05 ENCOUNTER — Encounter (HOSPITAL_COMMUNITY)
Admission: RE | Disposition: A | Discharge status: Home / Self Care | Payer: Self-pay | Source: Ambulatory Visit | Attending: Internal Medicine

## 2018-06-05 ENCOUNTER — Encounter (HOSPITAL_COMMUNITY): Payer: Self-pay | Admitting: *Deleted

## 2018-06-05 ENCOUNTER — Ambulatory Visit (HOSPITAL_COMMUNITY)
Admission: RE | Admit: 2018-06-05 | Discharge: 2018-06-05 | Disposition: A | Discharge status: Home / Self Care | Payer: 59 | Source: Ambulatory Visit | Attending: Internal Medicine | Admitting: Internal Medicine

## 2018-06-05 DIAGNOSIS — Z7952 Long term (current) use of systemic steroids: Secondary | ICD-10-CM | POA: Insufficient documentation

## 2018-06-05 DIAGNOSIS — E039 Hypothyroidism, unspecified: Secondary | ICD-10-CM | POA: Insufficient documentation

## 2018-06-05 DIAGNOSIS — Z800 Family history of malignant neoplasm of digestive organs: Secondary | ICD-10-CM | POA: Diagnosis not present

## 2018-06-05 DIAGNOSIS — Z1211 Encounter for screening for malignant neoplasm of colon: Secondary | ICD-10-CM | POA: Insufficient documentation

## 2018-06-05 DIAGNOSIS — Z79890 Hormone replacement therapy (postmenopausal): Secondary | ICD-10-CM | POA: Diagnosis not present

## 2018-06-05 DIAGNOSIS — Z87891 Personal history of nicotine dependence: Secondary | ICD-10-CM | POA: Diagnosis not present

## 2018-06-05 DIAGNOSIS — Z79899 Other long term (current) drug therapy: Secondary | ICD-10-CM | POA: Insufficient documentation

## 2018-06-05 HISTORY — PX: COLONOSCOPY: SHX5424

## 2018-06-05 SURGERY — COLONOSCOPY
Anesthesia: Moderate Sedation

## 2018-06-05 MED ORDER — MEPERIDINE HCL 50 MG/ML IJ SOLN
INTRAMUSCULAR | Status: DC | PRN
  Administered 2018-06-05 (×2): 25 mg via INTRAVENOUS

## 2018-06-05 MED ORDER — MIDAZOLAM HCL 5 MG/5ML IJ SOLN
INTRAMUSCULAR | Status: AC
  Filled 2018-06-05: qty 10

## 2018-06-05 MED ORDER — STERILE WATER FOR IRRIGATION IR SOLN
Status: DC | PRN
  Administered 2018-06-05: 11:00:00

## 2018-06-05 MED ORDER — MIDAZOLAM HCL 5 MG/5ML IJ SOLN
INTRAMUSCULAR | Status: DC | PRN
  Administered 2018-06-05 (×2): 2 mg via INTRAVENOUS
  Administered 2018-06-05 (×2): 1 mg via INTRAVENOUS
  Administered 2018-06-05: 2 mg via INTRAVENOUS

## 2018-06-05 MED ORDER — MEPERIDINE HCL 50 MG/ML IJ SOLN
INTRAMUSCULAR | Status: AC
  Filled 2018-06-05: qty 1

## 2018-06-05 MED ORDER — SODIUM CHLORIDE 0.9 % IV SOLN
INTRAVENOUS | Status: DC
  Administered 2018-06-05: 1000 mL via INTRAVENOUS

## 2018-06-05 NOTE — Discharge Instructions (Signed)
Resume usual medications and diet as before. No driving for 24 hours. Next colonoscopy in 5 years.     Colonoscopy, Adult, Care After This sheet gives you information about how to care for yourself after your procedure. Your doctor may also give you more specific instructions. If you have problems or questions, call your doctor. Follow these instructions at home: General instructions   For the first 24 hours after the procedure: ? Do not drive or use machinery. ? Do not sign important documents. ? Do not drink alcohol. ? Do your daily activities more slowly than normal. ? Eat foods that are soft and easy to digest. ? Rest often.  Take over-the-counter or prescription medicines only as told by your doctor.  It is up to you to get the results of your procedure. Ask your doctor, or the department performing the procedure, when your results will be ready. To help cramping and bloating:  Try walking around.  Put heat on your belly (abdomen) as told by your doctor. Use a heat source that your doctor recommends, such as a moist heat pack or a heating pad. ? Put a towel between your skin and the heat source. ? Leave the heat on for 20-30 minutes. ? Remove the heat if your skin turns bright red. This is especially important if you cannot feel pain, heat, or cold. You can get burned. Eating and drinking  Drink enough fluid to keep your pee (urine) clear or pale yellow.  Return to your normal diet as told by your doctor. Avoid heavy or fried foods that are hard to digest.  Avoid drinking alcohol for as long as told by your doctor. Contact a doctor if:  You have blood in your poop (stool) 2-3 days after the procedure. Get help right away if:  You have more than a small amount of blood in your poop.  You see large clumps of tissue (blood clots) in your poop.  Your belly is swollen.  You feel sick to your stomach (nauseous).  You throw up (vomit).  You have a fever.  You  have belly pain that gets worse, and medicine does not help your pain. This information is not intended to replace advice given to you by your health care provider. Make sure you discuss any questions you have with your health care provider. Document Released: 09/30/2010 Document Revised: 05/22/2016 Document Reviewed: 05/22/2016 Elsevier Interactive Patient Education  2017 [employer].

## 2018-06-05 NOTE — Op Note (Signed)
[facility] Patient Name: Kimberly Reyes Procedure Date: 06/05/2018 10:39 AM MRN: [id number] Date of Birth: February 25, 1956 Attending MD: Hildred Laser , MD CSN: [id number] Age: 62 Admit Type: Outpatient Procedure:                Colonoscopy Indications:              Screening in patient at increased risk: Colorectal                            cancer in father before age 56 Providers:                Hildred Laser, MD, Otis Peak B. Sharon Seller, RN, Randa Spike, Technician Referring MD:             Halford Chessman, MD Medicines:                Meperidine 50 mg IV, Midazolam 8 mg IV Complications:            No immediate complications. Estimated Blood Loss:     Estimated blood loss: none. Procedure:                Pre-Anesthesia Assessment:                           - Prior to the procedure, a History and Physical                            was performed, and patient medications and                            allergies were reviewed. The patient's tolerance of                            previous anesthesia was also reviewed. The risks                            and benefits of the procedure and the sedation                            options and risks were discussed with the patient.                            All questions were answered, and informed consent                            was obtained. Prior Anticoagulants: The patient has                            taken no previous anticoagulant or antiplatelet                            agents. ASA Grade Assessment: III - A patient with  severe systemic disease. After reviewing the risks                            and benefits, the patient was deemed in                            satisfactory condition to undergo the procedure.                           After obtaining informed consent, the colonoscope                            was passed under direct vision. Throughout the                procedure, the patient's blood pressure, pulse, and                            oxygen saturations were monitored continuously. The                            [id number]-[id number] ([id number]) scope was introduced through                            the anus and advanced to the the cecum, identified                            by appendiceal orifice and ileocecal valve. The                            colonoscopy was technically difficult and complex                            due to restricted mobility of the colon and                            significant looping. Successful completion of the                            procedure was aided by changing the patient to a                            supine position, withdrawing and reinserting the                            scope and applying abdominal pressure. Scope guide                            was also used. Anatomical landmarks were                            photographed. Scope In: 11:11:23 AM Scope Out: 11:36:53 AM Scope Withdrawal Time: 0 hours 10 minutes 41 seconds  Total Procedure Duration: 0 hours 25 minutes 30 seconds  Findings:      The perianal and  digital rectal examinations were normal.      The colon (entire examined portion) appeared normal.      The retroflexed view of the distal rectum and anal verge was normal and       showed no anal or rectal abnormalities. Impression:               - The entire examined colon is normal.                           - No specimens collected. Moderate Sedation:      Moderate (conscious) sedation was administered by the endoscopy nurse       and supervised by the endoscopist. The following parameters were       monitored: oxygen saturation, heart rate, blood pressure, CO2       capnography and response to care. Total physician intraservice time was       31 minutes. Recommendation:           - Patient has a contact number available for                            emergencies. The signs and  symptoms of potential                            delayed complications were discussed with the                            patient. Return to normal activities tomorrow.                            Written discharge instructions were provided to the                            patient.                           - Resume previous diet today.                           - Continue present medications.                           - Repeat colonoscopy in 5 years for screening                            purposes. Procedure Code(s):        --- Professional ---                           [phone], Colonoscopy, flexible; diagnostic, including                            collection of specimen(s) by brushing or washing,                            when performed (separate procedure)  [id number], Moderate sedation services provided by the                            same physician or other qualified health care                            professional performing a gastrointestinal                            endoscopic service that sedation supports,                            requiring the presence of an independent trained                            observer to assist in the monitoring of the                            patient's level of consciousness and physiological                            status; initial 15 minutes of intra-service time;                            patient age 64 years or older (additional time may                            be reported with [phone], as appropriate)                           [id number], Moderate sedation services provided by the                            same physician or other qualified health care                            professional performing the diagnostic or                            therapeutic service that the sedation supports,                            requiring the presence of an independent trained                            observer to assist  in the monitoring of the                            patient's level of consciousness and physiological                            status; each additional 15 minutes intraservice  time (List separately in addition to code for                            primary service) Diagnosis Code(s):        --- Professional ---                           Z80.0, Family history of malignant neoplasm of                            digestive organs CPT copyright 2017 [employer]. All rights reserved. The codes documented in this report are preliminary and upon coder review may  be revised to meet current compliance requirements. Hildred Laser, MD Hildred Laser, MD 06/05/2018 11:45:54 AM This report has been signed electronically. Number of Addenda: 0

## 2018-06-05 NOTE — H&P (Signed)
Kimberly Reyes is an 62 y.o. female.   Chief Complaint: Patient is here for colonoscopy. HPI: Patient is 62 year old Caucasian female who is here for higher screening colonoscopy.  Last exam was in 2014.  She has chronic constipation.  She moves her bowels once a week.  She rarely has to take a laxative.  This has been her pattern for years.  She denies abdominal pain or rectal bleeding. History significant for CRC in her father who was 76 at the time of diagnosis.  Past Medical History:  Diagnosis Date  . Anxiety   . Asthma   40 yrs ago   not current probem no inhaler use  . Headache(784.0)   . Hypothyroidism        Immune hepatitis.  Past Surgical History:  Procedure Laterality Date  . ABDOMINAL HYSTERECTOMY     20 years ago, complete  . CHOLECYSTECTOMY N/A 11/03/2014   Procedure: LAPAROSCOPIC CHOLECYSTECTOMY WITH INTRAOPERATIVE CHOLANGIOGRAM;  Surgeon: Gayland Curry, MD;  Location: [facility];  Service: General;  Laterality: N/A;  . COLONOSCOPY N/A 03/27/2013   Procedure: COLONOSCOPY;  Surgeon: Rogene Houston, MD;  Location: [facility];  Service: Endoscopy;  Laterality: N/A;  1030  . KNEE ARTHROSCOPY W/ MENISCAL REPAIR Right   . TUBAL LIGATION      Family History  Problem Relation Age of Onset  . Colon cancer Father   . CVA Mother   . Pneumonia Brother   . Cancer Sister        breast, thyroid   . Heart disease Sister   . Healthy Son    Social History:  reports that she has quit smoking. She has never used smokeless tobacco. She reports that she drank alcohol. She reports that she does not use drugs.  Allergies: No Known Allergies  Medications Prior to Admission  Medication Sig Dispense Refill  . azaTHIOprine (IMURAN) 50 MG tablet Take 50 mg by mouth at bedtime.     . Calcium-Magnesium-Vitamin D (CALCIUM 1200+D3 PO) Take 1 tablet by mouth at bedtime.    . Cyanocobalamin (VITAMIN B-12) 5000 MCG SUBL Place 5,000 mcg under the tongue at bedtime.     . cyclobenzaprine  (FLEXERIL) 10 MG tablet Take 10 mg by mouth 2 (two) times daily.     . cycloSPORINE (RESTASIS) 0.05 % ophthalmic emulsion Place 1 drop into both eyes at bedtime.    Marland Kitchen eletriptan (RELPAX) 40 MG tablet Take 40 mg by mouth every 2 (two) hours as needed for migraine. One tablet by mouth at onset of headache. May repeat in 2 hours if headache persists or recurs.     . furosemide (LASIX) 40 MG tablet Take 40 mg daily by mouth. In the morning    . levothyroxine (SYNTHROID, LEVOTHROID) 88 MCG tablet Take 88 mcg by mouth daily at 2 am.     . Multiple Vitamin (MULTIVITAMIN) tablet Take 1 tablet by mouth every evening.     . polyethylene glycol-electrolytes (NULYTELY/GOLYTELY) 420 g solution Take 4,000 mLs by mouth once.  0  . predniSONE (DELTASONE) 10 MG tablet Take 5 mg by mouth daily after breakfast.     . Turmeric Curcumin 500 MG CAPS Take 500 mg by mouth every evening.    . zolpidem (AMBIEN) 10 MG tablet Take 10 mg at bedtime as needed by mouth for sleep.       No results found for this or any previous visit (from the past 48 hour(s)). No results found.  ROS  Blood pressure 125/71, pulse 77, temperature 98.4 F (36.9 C), temperature source Oral, resp. rate 14, height 5' 8.5" (1.74 m), weight 79.8 kg, SpO2 100 %. Physical Exam  Constitutional: She appears well-developed and well-nourished.  HENT:  Mouth/Throat: Oropharynx is clear and moist.  Eyes: Conjunctivae are normal. No scleral icterus.  Neck: No thyromegaly present.  Cardiovascular: Normal rate, regular rhythm and normal heart sounds.  No murmur heard. Respiratory: Effort normal and breath sounds normal.  GI: Soft. She exhibits no distension and no mass. There is no tenderness.  Musculoskeletal: She exhibits no edema.  Lymphadenopathy:    She has no cervical adenopathy.  Neurological: She is alert.  Skin: Skin is warm and dry.     Assessment/Plan High risk screening colonoscopy.  Hildred Laser, MD 06/05/2018, 11:02 AM

## 2018-06-10 ENCOUNTER — Encounter (HOSPITAL_COMMUNITY): Payer: Self-pay | Admitting: Internal Medicine

## 2018-06-20 IMAGING — DX DG CHEST 2V
2 series · 2 of 2 positions shown · non-contrast
Comparison: Chest x-ray dated February 07, 2017.

CLINICAL DATA: Fever for the past week.  No chest complaints.

EXAM:
CHEST  2 VIEW

[chest pa]
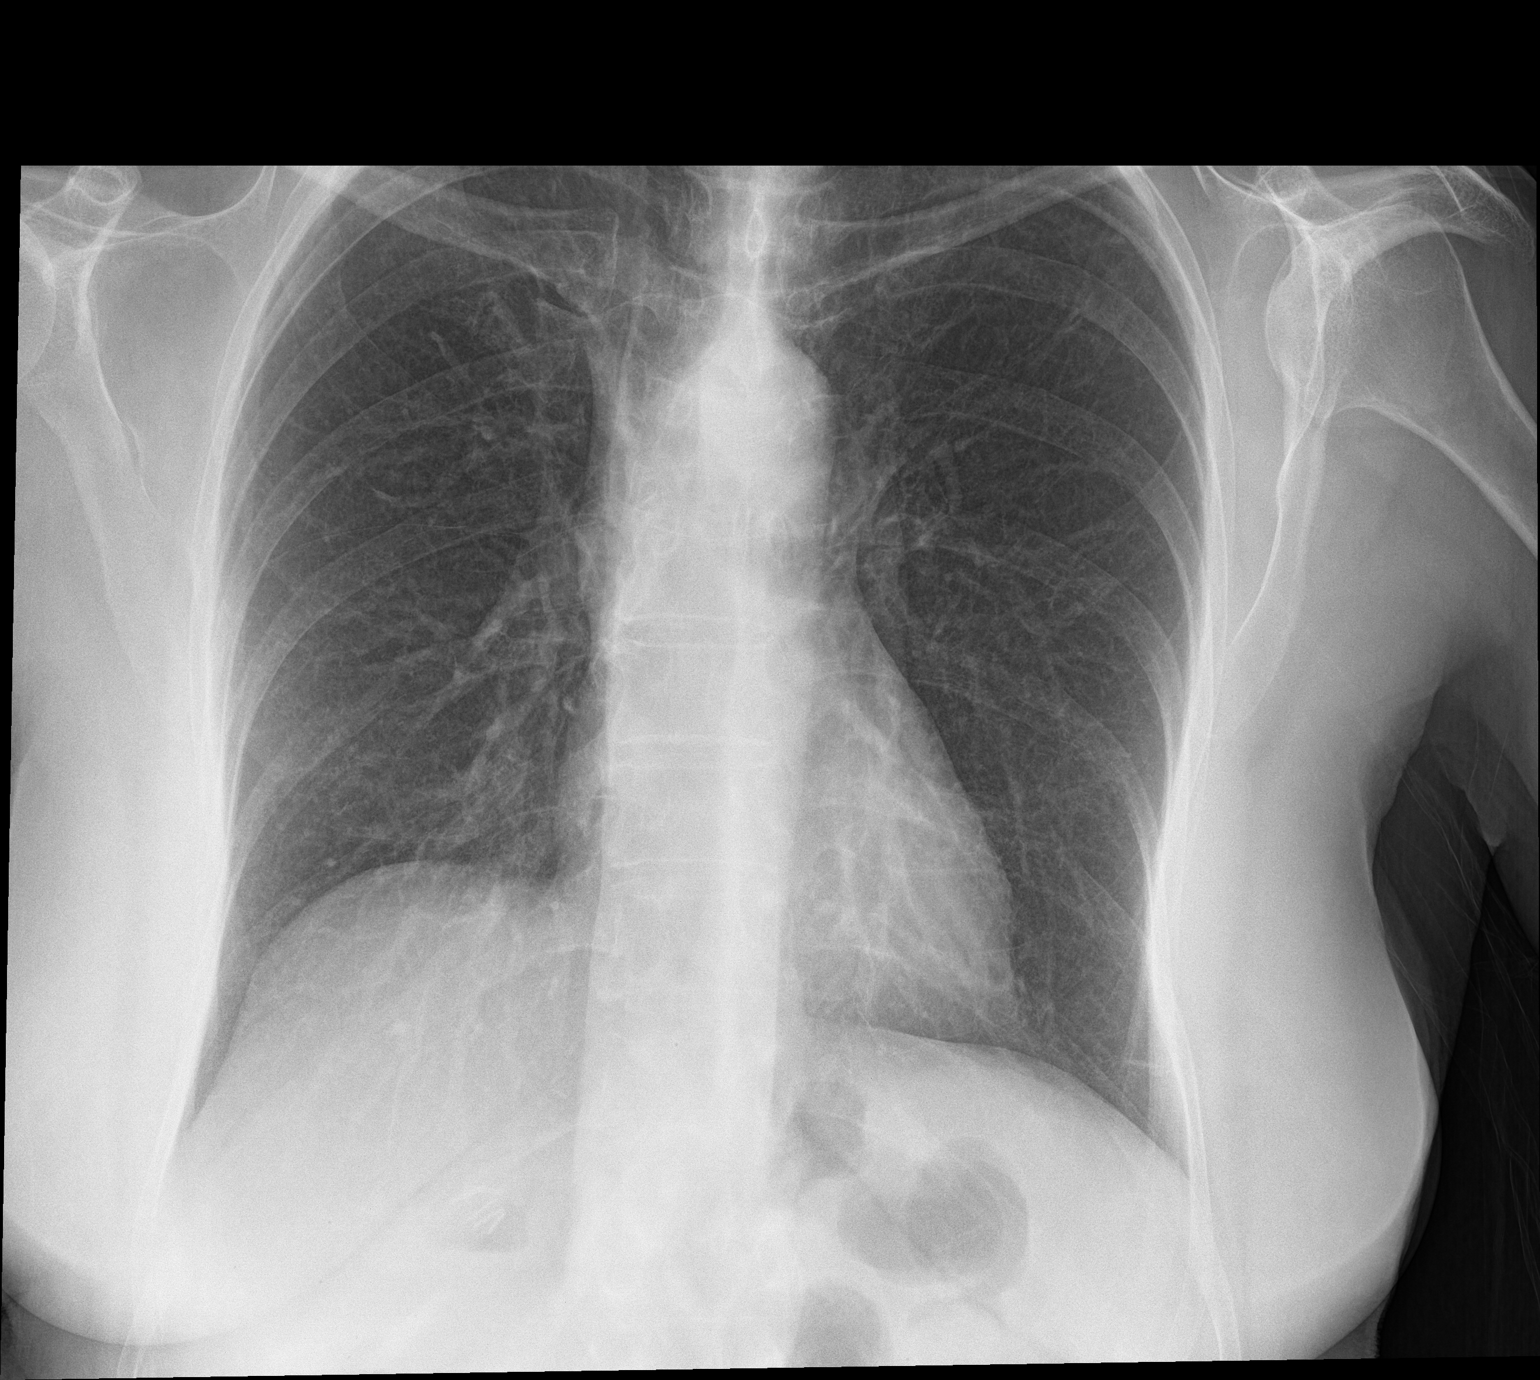

[chest lat]
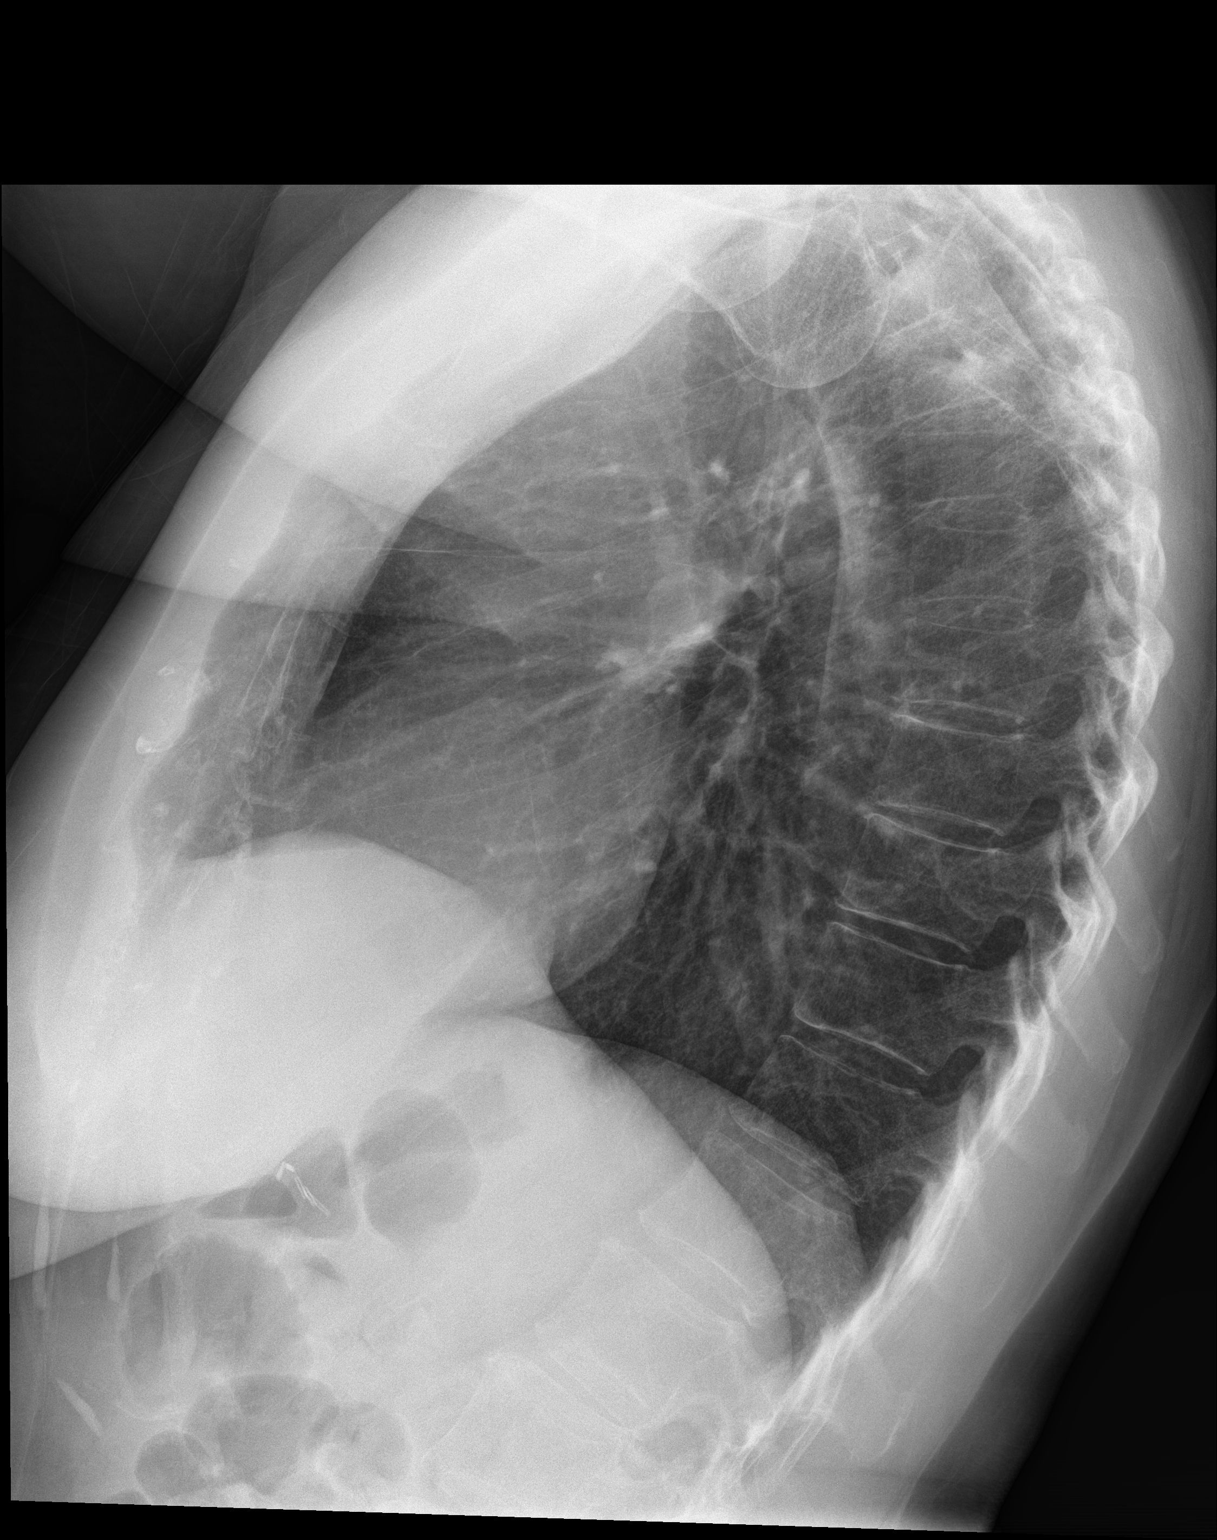

[2 of 2 positions shown; findings below may reference images not displayed]

FINDINGS: The heart size and mediastinal contours are within normal limits.
Both lungs are clear. The visualized skeletal structures are
unremarkable.
IMPRESSION: No active cardiopulmonary disease.

## 2018-07-17 DIAGNOSIS — L570 Actinic keratosis: Secondary | ICD-10-CM | POA: Diagnosis not present

## 2018-07-17 DIAGNOSIS — D1801 Hemangioma of skin and subcutaneous tissue: Secondary | ICD-10-CM | POA: Diagnosis not present

## 2018-07-17 DIAGNOSIS — L659 Nonscarring hair loss, unspecified: Secondary | ICD-10-CM | POA: Diagnosis not present

## 2018-07-17 DIAGNOSIS — L821 Other seborrheic keratosis: Secondary | ICD-10-CM | POA: Diagnosis not present

## 2018-07-31 NOTE — Progress Notes (Signed)
Office Visit Note  Patient: Kimberly Reyes             Date of Birth: Jan 21, 1956           MRN: [id number]             PCP: Sharilyn Sites, MD Referring: Sharilyn Sites, MD Visit Date: 08/14/2018 Occupation: @GUAROCC @  Subjective:  Right knee pain    History of Present Illness: Kimberly Reyes is a 62 y.o. female with history of myofascial pain syndrome and osteoarthritis.  She is on Imuran 50 mg 1 tablet by mouth daily and prednisone 5 mg by mouth daily for treatment of autoimmune hepatitis. She continues to follow up with Dr. Benson Norway every 6 months and has regular blood work.  She states she continues to have chronic right knee pain.  She follows up with Dr. Noemi Chapel every 6 months for gel injections.  For the past 2 weeks she has started working out and walking/running on the treadmill.  She states the right knee pain is exacerbated by climbing steps. She states her right knee stays swollen and painful.  She is not ready for a knee replacement. She states she has noticed increased stiffness in both hands as well as muscle cramping in hands and feet intermittently. She has noticed increased right hip pain with her increased exercise routine lately.     Activities of Daily Living:  Patient reports morning stiffness for 45-60  minutes.   Patient Reports nocturnal pain.  Difficulty dressing/grooming: Denies Difficulty climbing stairs: Reports Difficulty getting out of chair: Denies Difficulty using hands for taps, buttons, cutlery, and/or writing: Reports  Review of Systems  Constitutional: Positive for fatigue.  HENT: Positive for mouth dryness. Negative for mouth sores and nose dryness.   Eyes: Positive for dryness (Restasis once daily). Negative for pain and visual disturbance.  Respiratory: Negative for cough, hemoptysis, shortness of breath and difficulty breathing.   Cardiovascular: Negative for chest pain, palpitations, hypertension and swelling in legs/feet.  Gastrointestinal:  Positive for constipation. Negative for blood in stool and diarrhea.  Endocrine: Negative for increased urination.  Genitourinary: Negative for painful urination.  Musculoskeletal: Positive for arthralgias, joint pain, morning stiffness and muscle tenderness. Negative for joint swelling, myalgias, muscle weakness and myalgias.  Skin: Negative for color change, pallor, rash, hair loss, nodules/bumps, skin tightness, ulcers and sensitivity to sunlight.  Allergic/Immunologic: Negative for susceptible to infections.  Neurological: Positive for headaches (hx of migraines). Negative for dizziness, numbness and weakness.  Hematological: Negative for swollen glands.  Psychiatric/Behavioral: Negative for depressed mood and sleep disturbance. The patient is not nervous/anxious.     PMFS History:  Patient Active Problem List   Diagnosis Date Noted  . Family hx of colon cancer 03/29/2018  . Elevated LFTs 08/06/2017  . Primary osteoarthritis of right hip 08/06/2017  . Primary osteoarthritis of both feet 08/06/2017  . Primary osteoarthritis of both hands 08/06/2017  . Myofascial pain 08/06/2017  . Hypothyroid 11/25/2014  . Hypothyroidism 11/25/2014    Past Medical History:  Diagnosis Date  . Anxiety   . Asthma   40 yrs ago   not current probem no inhaler use  . Headache(784.0)   . Hypothyroidism     Family History  Problem Relation Age of Onset  . Colon cancer Father   . CVA Mother   . Pneumonia Brother   . Cancer Sister        breast, thyroid   . Heart disease Sister   .  Healthy Son    Past Surgical History:  Procedure Laterality Date  . ABDOMINAL HYSTERECTOMY     20 years ago, complete  . CHOLECYSTECTOMY N/A 11/03/2014   Procedure: LAPAROSCOPIC CHOLECYSTECTOMY WITH INTRAOPERATIVE CHOLANGIOGRAM;  Surgeon: Gayland Curry, MD;  Location: [facility];  Service: General;  Laterality: N/A;  . COLONOSCOPY N/A 03/27/2013   Procedure: COLONOSCOPY;  Surgeon: Rogene Houston, MD;  Location: [facility];  Service: Endoscopy;  Laterality: N/A;  1030  . COLONOSCOPY N/A 06/05/2018   Procedure: COLONOSCOPY;  Surgeon: Rogene Houston, MD;  Location: [facility];  Service: Endoscopy;  Laterality: N/A;  1025  . KNEE ARTHROSCOPY W/ MENISCAL REPAIR Right   . TUBAL LIGATION     Social History   Social History Narrative  . Not on file    Objective: Vital Signs: BP 133/81 (BP Location: Left Arm, Patient Position: Sitting, Cuff Size: Normal)   Pulse 90   Resp 12   Ht 5\' 9"  (1.753 m)   Wt 180 lb 9.6 oz (81.9 kg)   BMI 26.67 kg/m    Physical Exam  Constitutional: She is oriented to person, place, and time. She appears well-developed and well-nourished.  HENT:  Head: Normocephalic and atraumatic.  Eyes: Conjunctivae and EOM are normal.  Neck: Normal range of motion.  Cardiovascular: Normal rate, regular rhythm, normal heart sounds and intact distal pulses.  Pulmonary/Chest: Effort normal and breath sounds normal.  Abdominal: Soft. Bowel sounds are normal.  Lymphadenopathy:    She has no cervical adenopathy.  Neurological: She is alert and oriented to person, place, and time.  Skin: Skin is warm and dry. Capillary refill takes less than 2 seconds.  Psychiatric: She has a normal mood and affect. Her behavior is normal.  Nursing note and vitals reviewed.    Musculoskeletal Exam: C-spine slightly limited ROM with lateral rotation.  Thoracic kyphosis noted.  Good ROM of lumbar spine with no discomfort.  No midline spinal tenderness.  No SI joint tenderness.  Shoulder joints, elbow joints, wrist joints, MCPs, PIPs, and DIPs good ROM with no synovitis.  PIP and DIP synovial thickening consistent with osteoarthritis of both hands. Complete fist formation bilaterally.  Full ROM of bilateral hips.  Some discomfort with right hip ROM. Knee joints good ROM.  Right knee discomfort with flexion and extension.  Right knee warmth and swelling noted.  No tenderness or swelling of ankle joints.  No  tenderness of trochanteric bursa bilaterally.   CDAI Exam: CDAI Score: Not documented Patient Global Assessment: Not documented; Provider Global Assessment: Not documented Swollen: Not documented; Tender: Not documented Joint Exam   Not documented   There is currently no information documented on the homunculus. Go to the Rheumatology activity and complete the homunculus joint exam.  Investigation: No additional findings.  Imaging: No results found.  Recent Labs: Lab Results  Component Value Date   WBC 8.4 07/20/2017   HGB 11.1 (L) 07/20/2017   PLT 193 07/20/2017   NA 138 06/26/2017   K 3.8 06/26/2017   CL 100 06/26/2017   CO2 31 06/26/2017   GLUCOSE 97 06/26/2017   BUN 13 06/26/2017   CREATININE 0.98 06/26/2017   BILITOT 0.4 06/26/2017   ALKPHOS 153 (H) 03/13/2017   AST 19 06/26/2017   ALT 108 (H) 06/26/2017   PROT 7.1 06/26/2017   PROT 7.1 06/26/2017   ALBUMIN 3.7 03/13/2017   CALCIUM 9.3 06/26/2017   GFRAA 71 06/26/2017   QFTBGOLD NEGATIVE 06/26/2017  Speciality Comments: No specialty comments available.  Procedures:  No procedures performed Allergies: Patient has no known allergies.   Current regimen includes Imuran 50 mg daily and prednisone 5 mg daily for autoimmune hepatitis managed by Dr. Benson Norway.    Assessment / Plan:     Visit Diagnoses: Polyarthralgia - All autoimmune workup negative.  She has no synovitis on exam.   Myofascial pain: She has had very mild muscle tenderness recently which she attributes to increasing her level of activity recently.  For the past 2 weeks she has been working out on a regular basis.  She has been running and walking on the treadmill. She was encouraged to continue to stay active and exercise on a regular basis.   Primary osteoarthritis of both hands: She has PIP and DIP synovial thickening. She has no synovitis on exam.  She has complete fist formation bilaterally.  She has noticed an increased frequency of hand  cramping. Joint protection and muscle strengthening were discussed.   Primary osteoarthritis of right hip: She has good ROM with minimal discomfort.  She has noticed increased discomfort since starting run/walk on the treadmill.   Primary osteoarthritis of both knees: Right knee warmth and swelling on exam.  She has bilateral knee crepitus. She continues to follow up with Dr. Noemi Chapel every 6 months for visco gel injections.  She does not want to proceed with a knee replacement at this time.   Primary osteoarthritis of both feet: She has osteoarthritic changes in both feet.  She has no discomfort at this time.  She wears proper fitting shoes.   Other kyphosis of thoracic region - She has thoracic kyphosis.  No midline spinal tenderness. She had a DEXA scan 09/19/17 T score of -2.3. She is on long-term prednisone 5 mg by mouth daily. She takes a calcium and vitamin D supplement.   Elevated LFTs - She is followed by Dr. Benson Norway for management of autoimmune hepatitis.  She follows up with him every 6 months.  She is taking Imuran 50 mg po daily and Prednisone 5 mg po daily. According to the patient she has lab work on a regular basis.   Orders: No orders of the defined types were placed in this encounter.  No orders of the defined types were placed in this encounter.  Follow-Up Instructions: Return in about 6 months (around 02/13/2019) for Osteoarthritis, Fibromyalgia.   Ofilia Neas, PA-C   I examined and evaluated the patient with Hazel Sams PA.  Patient had no synovitis on examination.  On my clinical examination she has osteoarthritis involving her hands and knee joints.  She continues to have some discomfort in her knee joints.  Low impact exercises were discussed.  For autoimmune hepatitis she has been followed by Dr. Benson Norway.  The plan of care was discussed as noted above.  Bo Merino, MD  Note - This record has been created using Editor, commissioning.  Chart creation errors have been sought,  but may not always  have been located. Such creation errors do not reflect on  the standard of medical care.

## 2018-08-14 ENCOUNTER — Encounter: Payer: Self-pay | Admitting: Rheumatology

## 2018-08-14 ENCOUNTER — Ambulatory Visit: Payer: 59 | Admitting: Rheumatology

## 2018-08-14 VITALS — BP 133/81 | HR 90 | Resp 12 | Ht 69.0 in | Wt 180.6 lb

## 2018-08-14 DIAGNOSIS — R945 Abnormal results of liver function studies: Secondary | ICD-10-CM

## 2018-08-14 DIAGNOSIS — M19072 Primary osteoarthritis, left ankle and foot: Secondary | ICD-10-CM

## 2018-08-14 DIAGNOSIS — M19071 Primary osteoarthritis, right ankle and foot: Secondary | ICD-10-CM

## 2018-08-14 DIAGNOSIS — M19041 Primary osteoarthritis, right hand: Secondary | ICD-10-CM

## 2018-08-14 DIAGNOSIS — M19042 Primary osteoarthritis, left hand: Secondary | ICD-10-CM

## 2018-08-14 DIAGNOSIS — M7918 Myalgia, other site: Secondary | ICD-10-CM

## 2018-08-14 DIAGNOSIS — M1611 Unilateral primary osteoarthritis, right hip: Secondary | ICD-10-CM

## 2018-08-14 DIAGNOSIS — R7989 Other specified abnormal findings of blood chemistry: Secondary | ICD-10-CM

## 2018-08-14 DIAGNOSIS — M2550 Pain in unspecified joint: Secondary | ICD-10-CM | POA: Diagnosis not present

## 2018-08-14 DIAGNOSIS — M170 Bilateral primary osteoarthritis of knee: Secondary | ICD-10-CM

## 2018-08-14 DIAGNOSIS — M40294 Other kyphosis, thoracic region: Secondary | ICD-10-CM

## 2018-08-18 DIAGNOSIS — R05 Cough: Secondary | ICD-10-CM | POA: Diagnosis not present

## 2018-08-27 DIAGNOSIS — M1711 Unilateral primary osteoarthritis, right knee: Secondary | ICD-10-CM | POA: Diagnosis not present

## 2018-09-03 ENCOUNTER — Emergency Department (HOSPITAL_COMMUNITY): Payer: 59

## 2018-09-03 ENCOUNTER — Encounter (HOSPITAL_COMMUNITY): Payer: Self-pay | Admitting: Emergency Medicine

## 2018-09-03 ENCOUNTER — Emergency Department (HOSPITAL_COMMUNITY)
Admission: EM | Admit: 2018-09-03 | Discharge: 2018-09-04 | Disposition: A | Discharge status: Home / Self Care | Payer: 59 | Attending: Emergency Medicine | Admitting: Emergency Medicine

## 2018-09-03 ENCOUNTER — Other Ambulatory Visit: Payer: Self-pay

## 2018-09-03 DIAGNOSIS — E039 Hypothyroidism, unspecified: Secondary | ICD-10-CM | POA: Diagnosis not present

## 2018-09-03 DIAGNOSIS — J189 Pneumonia, unspecified organism: Secondary | ICD-10-CM | POA: Diagnosis not present

## 2018-09-03 DIAGNOSIS — J029 Acute pharyngitis, unspecified: Secondary | ICD-10-CM | POA: Diagnosis present

## 2018-09-03 DIAGNOSIS — R05 Cough: Secondary | ICD-10-CM | POA: Diagnosis not present

## 2018-09-03 DIAGNOSIS — J181 Lobar pneumonia, unspecified organism: Secondary | ICD-10-CM

## 2018-09-03 DIAGNOSIS — J45909 Unspecified asthma, uncomplicated: Secondary | ICD-10-CM | POA: Diagnosis not present

## 2018-09-03 DIAGNOSIS — Z79899 Other long term (current) drug therapy: Secondary | ICD-10-CM | POA: Diagnosis not present

## 2018-09-03 LAB — INFLUENZA PANEL BY PCR (TYPE A & B)
INFLBPCR: NEGATIVE
Influenza A By PCR: NEGATIVE

## 2018-09-03 LAB — GROUP A STREP BY PCR: GROUP A STREP BY PCR: NOT DETECTED

## 2018-09-03 NOTE — ED Notes (Signed)
Patient stated she has had a sore throat and nasal drainage, continues to feel worse.

## 2018-09-03 NOTE — ED Triage Notes (Signed)
Patient reports cough, sore throat, and fever that started this am.

## 2018-09-03 NOTE — ED Provider Notes (Signed)
[facility] EMERGENCY DEPARTMENT Provider Note   CSN: [id number] Arrival date & time: 09/03/18  2152     History   Chief Complaint Chief Complaint  Patient presents with  . Influenza    HPI Kimberly Reyes is a 62 y.o. female with past medical history significant for hypothyroidism, autoimmune hepatitis who presents for evaluation of sore throat, fever and cough.  Patient states fever started this morning.  Patient states her temperature at home was 100.2.  Patient states she has been taking Alka-Seltzer for cold for her symptoms.  Patient states she has a nonproductive cough.  Admits to light yellow rhinorrhea which she states "drips down the back of my throat."  Patient did receive the influenza vaccine.  Denies headache, vision changes, neck stiffness, neck pain, chest pain, shortness of breath, abdominal pain, dysuria, diarrhea.  States she has had multiple sick contacts at work.  Denies additional aggravating or alleviating symptoms.  History obtained from patient.  No interpreter was used.  HPI  Past Medical History:  Diagnosis Date  . Anxiety   . Asthma   40 yrs ago   not current probem no inhaler use  . Headache(784.0)   . Hypothyroidism     Patient Active Problem List   Diagnosis Date Noted  . Family hx of colon cancer 03/29/2018  . Elevated LFTs 08/06/2017  . Primary osteoarthritis of right hip 08/06/2017  . Primary osteoarthritis of both feet 08/06/2017  . Primary osteoarthritis of both hands 08/06/2017  . Myofascial pain 08/06/2017  . Hypothyroid 11/25/2014  . Hypothyroidism 11/25/2014    Past Surgical History:  Procedure Laterality Date  . ABDOMINAL HYSTERECTOMY     20 years ago, complete  . CHOLECYSTECTOMY N/A 11/03/2014   Procedure: LAPAROSCOPIC CHOLECYSTECTOMY WITH INTRAOPERATIVE CHOLANGIOGRAM;  Surgeon: Gayland Curry, MD;  Location: [facility];  Service: General;  Laterality: N/A;  . COLONOSCOPY N/A 03/27/2013   Procedure: COLONOSCOPY;  Surgeon: Rogene Houston, MD;  Location: [facility];  Service: Endoscopy;  Laterality: N/A;  1030  . COLONOSCOPY N/A 06/05/2018   Procedure: COLONOSCOPY;  Surgeon: Rogene Houston, MD;  Location: [facility];  Service: Endoscopy;  Laterality: N/A;  1025  . KNEE ARTHROSCOPY W/ MENISCAL REPAIR Right   . TUBAL LIGATION       OB History    Gravida  2   Para  1   Term  1   Preterm      AB  1   Living        SAB  1   TAB      Ectopic      Multiple      Live Births               Home Medications    Prior to Admission medications   Medication Sig Start Date End Date Taking? Authorizing Provider  azaTHIOprine (IMURAN) 50 MG tablet Take 50 mg by mouth at bedtime.     [provider]  Calcium-Magnesium-Vitamin D (CALCIUM 1200+D3 PO) Take 1 tablet by mouth at bedtime.    [provider]  Cyanocobalamin (VITAMIN B-12) 5000 MCG SUBL Place 5,000 mcg under the tongue at bedtime.     [provider]  cyclobenzaprine (FLEXERIL) 10 MG tablet Take 10 mg by mouth 2 (two) times daily.     [provider]  cycloSPORINE (RESTASIS) 0.05 % ophthalmic emulsion Place 1 drop into both eyes at bedtime.    [provider]  doxycycline (VIBRAMYCIN) 100 MG capsule Take 1 capsule (100 mg total) by mouth 2 (two) times daily for 7 days. 09/04/18 09/11/18  Kollen Armenti A, PA-C  eletriptan (RELPAX) 40 MG tablet Take 40 mg by mouth every 2 (two) hours as needed for migraine. One tablet by mouth at onset of headache. May repeat in 2 hours if headache persists or recurs.     [provider]  furosemide (LASIX) 40 MG tablet Take 40 mg daily by mouth. In the morning 06/15/17   [provider]  levothyroxine (SYNTHROID, LEVOTHROID) 88 MCG tablet Take 88 mcg by mouth daily at 2 am.  06/23/17   [provider]  Multiple Vitamin (MULTIVITAMIN) tablet Take 1 tablet by mouth every evening.     [provider]  predniSONE (DELTASONE) 10 MG  tablet Take 5 mg by mouth daily after breakfast.  07/27/17   [provider]  Turmeric Curcumin 500 MG CAPS Take 500 mg by mouth every evening.    [provider]  zolpidem (AMBIEN) 10 MG tablet Take 10 mg at bedtime as needed by mouth for sleep.  05/12/17   [provider]    Family History Family History  Problem Relation Age of Onset  . Colon cancer Father   . CVA Mother   . Pneumonia Brother   . Cancer Sister        breast, thyroid   . Heart disease Sister   . Healthy Son     Social History Social History   Tobacco Use  . Smoking status: Never Smoker  . Smokeless tobacco: Never Used  Substance Use Topics  . Alcohol use: Not Currently  . Drug use: Never     Allergies   Patient has no known allergies.   Review of Systems Review of Systems  Constitutional: Negative.   HENT: Positive for congestion, postnasal drip, rhinorrhea, sinus pressure and sore throat. Negative for drooling, ear discharge, ear pain, facial swelling, hearing loss, mouth sores, nosebleeds, sinus pain, sneezing, tinnitus, trouble swallowing and voice change.   Respiratory: Positive for cough. Negative for choking, chest tightness, shortness of breath, wheezing and stridor.   Cardiovascular: Negative.   Gastrointestinal: Negative.   Genitourinary: Negative.   Musculoskeletal: Negative.   Skin: Negative.   Neurological: Negative.   All other systems reviewed and are negative.    Physical Exam Updated Vital Signs BP 130/70 (BP Location: Left Arm)   Pulse (!) 108   Temp 99.2 F (37.3 C) (Oral)   Resp 16   Ht 5\' 9"  (1.753 m)   Wt 78.5 kg   SpO2 100%   BMI 25.55 kg/m   Physical Exam Vitals signs and nursing note reviewed.  Constitutional:      General: She is not in acute distress.    Appearance: She is well-developed. She is not ill-appearing, toxic-appearing or diaphoretic.  HENT:     Head: Normocephalic and atraumatic.     Right Ear: Tympanic membrane, ear  canal and external ear normal. No drainage, swelling or tenderness. No middle ear effusion. Tympanic membrane is not injected, scarred, perforated, erythematous, retracted or bulging.     Left Ear: Tympanic membrane, ear canal and external ear normal. No drainage, swelling or tenderness.  No middle ear effusion. Tympanic membrane is not injected, scarred, perforated, erythematous, retracted or bulging.     Nose: Mucosal edema, congestion and rhinorrhea present. No nasal tenderness.     Right Sinus: No maxillary sinus tenderness or frontal sinus tenderness.  Left Sinus: No maxillary sinus tenderness or frontal sinus tenderness.     Mouth/Throat:     Lips: Pink.     Mouth: Mucous membranes are moist.     Pharynx: Uvula midline. No oropharyngeal exudate, posterior oropharyngeal erythema or uvula swelling.     Tonsils: No tonsillar exudate or tonsillar abscesses.     Comments: Uvula midline without edema.  No tonsillar exudate or tonsillar edema.  Posterior oropharynx with mild erythema without edema or exudate. Eyes:     Pupils: Pupils are equal, round, and reactive to light.  Neck:     Musculoskeletal: Full passive range of motion without pain and normal range of motion. No muscular tenderness.     Trachea: Phonation normal.  Cardiovascular:     Rate and Rhythm: Normal rate.     Pulses: Normal pulses.     Heart sounds: Normal heart sounds. No murmur.  Pulmonary:     Effort: Pulmonary effort is normal. No tachypnea, accessory muscle usage, prolonged expiration or respiratory distress.     Breath sounds: Normal breath sounds and air entry. No stridor, decreased air movement or transmitted upper airway sounds. No decreased breath sounds, wheezing, rhonchi or rales.     Comments: Clear to auscultation bilaterally.  No rhonchi, wheeze or Rales.  Able to speak in full sentences without difficulty. Abdominal:     General: There is no distension.     Palpations: Abdomen is soft.     Tenderness:  There is no abdominal tenderness. There is no guarding or rebound.  Musculoskeletal: Normal range of motion.     Comments: Moves all extremities without difficulty.  Able to ambulate in department without difficulty.  Lymphadenopathy:     Cervical: No cervical adenopathy.  Skin:    General: Skin is warm and dry.     Comments: No rashes or lesions.  Neurological:     Mental Status: She is alert.      ED Treatments / Results  Labs (all labs ordered are listed, but only abnormal results are displayed) Labs Reviewed  GROUP A STREP BY PCR  INFLUENZA PANEL BY PCR (TYPE A & B)    EKG None  Radiology Dg Chest 2 View  Result Date: 09/03/2018 CLINICAL DATA:  Acute onset of cough, fever and sore throat. Nasal drainage. EXAM: CHEST - 2 VIEW COMPARISON:  Chest radiograph performed 09/20/2017 FINDINGS: The lungs are well-aerated. Mild left basilar and left apical airspace opacity may reflect atelectasis or possibly mild infection, given the patient's symptoms. There is no evidence of pleural effusion or pneumothorax. The heart is normal in size; the mediastinal contour is within normal limits. No acute osseous abnormalities are seen. IMPRESSION: Mild left basilar and left apical airspace opacity may reflect atelectasis or possibly mild infection, given the patient's symptoms. Followup PA and lateral chest X-ray is recommended in 3-4 weeks following trial of antibiotic therapy to ensure resolution and exclude underlying malignancy. Electronically Signed   By: Garald Balding M.D.   On: 09/03/2018 23:49    Procedures Procedures (including critical care time)  Medications Ordered in ED Medications  doxycycline (VIBRA-TABS) tablet 100 mg (has no administration in time range)     Initial Impression / Assessment and Plan / ED Course  I have reviewed the triage vital signs and the nursing notes.  Pertinent labs & imaging results that were available during my care of the patient were reviewed by  me and considered in my medical decision making (see chart for details).  62 year old female presents for evaluation of rhinorrhea, cough, fever.  Afebrile, nonseptic, non-ill-appearing in department.  She is able to speak in full sentences without difficulty.  She does not appear in any acute distress.  Lungs clear to auscultation bilaterally without any wheeze, rales, rhonchi.  Posterior oropharynx with erythema without edema or exudate.  Tonsils without edema or exudate.  No evidence of PTA, RPA or deep space infection.  Patient does have rhinorrhea as well as nasal congestion.  Cough nonproductive.  Symptoms started approximately 12 hours PTA.  Has taken Tylenol for cold this morning for her symptoms.  She did receive influenza vaccine.  Will obtain influenza, strep screen, chest xray and reevaluate.   Strep negative, influenza negative. Chest xray possible atelectasis versus mild infiltrates. Patient is hemodynamically stable at this time.  Given patient does have respiratory symptoms will treat for pneumonia and have follow-up with PCP.  Patient able to tolerate p.o. intake in the department.  She is afebrile, nonseptic, non-ill-appearing.  Discussed return precautions.  Patient to follow-up with PCP for reevaluation.  Patient voiced understanding and is agreeable for follow-up.  Final Clinical Impressions(s) / ED Diagnoses   Final diagnoses:  Community acquired pneumonia of left lower lobe of lung [facility])    ED Discharge Orders         Ordered    doxycycline (VIBRAMYCIN) 100 MG capsule  2 times daily     09/04/18 0010           Terrill Wauters A, PA-C 09/04/18 0013    Milton Ferguson, MD 09/05/18 [phone]

## 2018-09-03 NOTE — ED Notes (Signed)
Patient reports fever at home, took [employer] cold.

## 2018-09-03 NOTE — ED Notes (Signed)
Patient transported to X-ray 

## 2018-09-04 MED ORDER — DOXYCYCLINE HYCLATE 100 MG PO CAPS: 100.0000 mg | ORAL_CAPSULE | Freq: Two times a day (BID) | ORAL | 0 refills | Status: AC

## 2018-09-04 MED ORDER — DOXYCYCLINE HYCLATE 100 MG PO TABS
100.0000 mg | ORAL_TABLET | Freq: Once | ORAL | Status: AC
  Administered 2018-09-04: 100 mg via ORAL
  Filled 2018-09-04: qty 1

## 2018-09-04 NOTE — Discharge Instructions (Addendum)
You were evaluated today for cough, fever and sore throat. Your strep test was negative as well as your flu test. Your chest xray showed a possible early pneumonia. I have given you a dose of your antibiotics in the ED. I have written you a prescription for antibiotics. Please follow up with your PCP for re-evaluation at the end of this week if you have continued symptoms.  Return to the ED with any new or worsening symptoms.

## 2018-09-05 DIAGNOSIS — J189 Pneumonia, unspecified organism: Secondary | ICD-10-CM | POA: Diagnosis not present

## 2018-09-05 DIAGNOSIS — E663 Overweight: Secondary | ICD-10-CM | POA: Diagnosis not present

## 2018-09-05 DIAGNOSIS — R0982 Postnasal drip: Secondary | ICD-10-CM | POA: Diagnosis not present

## 2018-09-10 DIAGNOSIS — M1711 Unilateral primary osteoarthritis, right knee: Secondary | ICD-10-CM | POA: Diagnosis not present

## 2018-09-25 ENCOUNTER — Ambulatory Visit (HOSPITAL_COMMUNITY)
Admission: RE | Admit: 2018-09-25 | Discharge: 2018-09-25 | Disposition: A | Discharge status: Home / Self Care | Payer: 59 | Source: Ambulatory Visit | Attending: Physician Assistant | Admitting: Physician Assistant

## 2018-09-25 ENCOUNTER — Other Ambulatory Visit (HOSPITAL_COMMUNITY): Payer: Self-pay | Admitting: Physician Assistant

## 2018-09-25 DIAGNOSIS — R0602 Shortness of breath: Secondary | ICD-10-CM

## 2018-10-18 ENCOUNTER — Ambulatory Visit (HOSPITAL_COMMUNITY)
Admission: RE | Admit: 2018-10-18 | Discharge: 2018-10-18 | Disposition: A | Discharge status: Home / Self Care | Payer: 59 | Source: Ambulatory Visit | Attending: Family Medicine | Admitting: Family Medicine

## 2018-10-18 ENCOUNTER — Encounter (HOSPITAL_COMMUNITY): Payer: Self-pay

## 2018-10-18 ENCOUNTER — Other Ambulatory Visit (HOSPITAL_COMMUNITY): Payer: Self-pay | Admitting: Family Medicine

## 2018-10-18 DIAGNOSIS — R0781 Pleurodynia: Secondary | ICD-10-CM | POA: Insufficient documentation

## 2018-10-18 DIAGNOSIS — R0789 Other chest pain: Secondary | ICD-10-CM

## 2019-02-06 NOTE — Progress Notes (Deleted)
Office Visit Note  Patient: Kimberly Reyes             Date of Birth: 07-16-1956           MRN: [id number]             PCP: Kimberly Sites, MD Referring: Kimberly Sites, MD Visit Date: 02/20/2019 Occupation: @GUAROCC @  Subjective:  No chief complaint on file.   History of Present Illness: Kimberly Reyes is a 63 y.o. female ***   Activities of Daily Living:  Patient reports morning stiffness for *** {minute/hour:19697}.   Patient {ACTIONS;DENIES/REPORTS:21021675::"Denies"} nocturnal pain.  Difficulty dressing/grooming: {ACTIONS;DENIES/REPORTS:21021675::"Denies"} Difficulty climbing stairs: {ACTIONS;DENIES/REPORTS:21021675::"Denies"} Difficulty getting out of chair: {ACTIONS;DENIES/REPORTS:21021675::"Denies"} Difficulty using hands for taps, buttons, cutlery, and/or writing: {ACTIONS;DENIES/REPORTS:21021675::"Denies"}  No Rheumatology ROS completed.   PMFS History:  Patient Active Problem List   Diagnosis Date Noted  . Family hx of colon cancer 03/29/2018  . Elevated LFTs 08/06/2017  . Primary osteoarthritis of right hip 08/06/2017  . Primary osteoarthritis of both feet 08/06/2017  . Primary osteoarthritis of both hands 08/06/2017  . Myofascial pain 08/06/2017  . Hypothyroid 11/25/2014  . Hypothyroidism 11/25/2014    Past Medical History:  Diagnosis Date  . Anxiety   . Asthma   40 yrs ago   not current probem no inhaler use  . Headache(784.0)   . Hypothyroidism     Family History  Problem Relation Age of Onset  . Colon cancer Father   . CVA Mother   . Pneumonia Brother   . Cancer Sister        breast, thyroid   . Heart disease Sister   . Healthy Son    Past Surgical History:  Procedure Laterality Date  . ABDOMINAL HYSTERECTOMY     20 years ago, complete  . CHOLECYSTECTOMY N/A 11/03/2014   Procedure: LAPAROSCOPIC CHOLECYSTECTOMY WITH INTRAOPERATIVE CHOLANGIOGRAM;  Surgeon: Kimberly Curry, MD;  Location: [facility];  Service: General;  Laterality: N/A;  .  COLONOSCOPY N/A 03/27/2013   Procedure: COLONOSCOPY;  Surgeon: Kimberly Houston, MD;  Location: [facility];  Service: Endoscopy;  Laterality: N/A;  1030  . COLONOSCOPY N/A 06/05/2018   Procedure: COLONOSCOPY;  Surgeon: Kimberly Houston, MD;  Location: [facility];  Service: Endoscopy;  Laterality: N/A;  1025  . KNEE ARTHROSCOPY W/ MENISCAL REPAIR Right   . TUBAL LIGATION     Social History   Social History Narrative  . Not on file    There is no immunization history on file for this patient.   Objective: Vital Signs: There were no vitals taken for this visit.   Physical Exam   Musculoskeletal Exam: ***  CDAI Exam: CDAI Score: Not documented Patient Global Assessment: Not documented; Provider Global Assessment: Not documented Swollen: Not documented; Tender: Not documented Joint Exam   Not documented   There is currently no information documented on the homunculus. Go to the Rheumatology activity and complete the homunculus joint exam.  Investigation: No additional findings.  Imaging: No results found.  Recent Labs: Lab Results  Component Value Date   WBC 8.4 07/20/2017   HGB 11.1 (L) 07/20/2017   PLT 193 07/20/2017   NA 138 06/26/2017   K 3.8 06/26/2017   CL 100 06/26/2017   CO2 31 06/26/2017   GLUCOSE 97 06/26/2017   BUN 13 06/26/2017   CREATININE 0.98 06/26/2017   BILITOT 0.4 06/26/2017   ALKPHOS 153 (H) 03/13/2017   AST 19 06/26/2017   ALT 108 (H)  06/26/2017   PROT 7.1 06/26/2017   PROT 7.1 06/26/2017   ALBUMIN 3.7 03/13/2017   CALCIUM 9.3 06/26/2017   GFRAA 71 06/26/2017   QFTBGOLD NEGATIVE 06/26/2017    Speciality Comments: No specialty comments available.  Procedures:  No procedures performed Allergies: Patient has no known allergies.   Assessment / Plan:     Visit Diagnoses: No diagnosis found.   Orders: No orders of the defined types were placed in this encounter.  No orders of the defined types were placed in this encounter.    Face-to-face time spent with patient was *** minutes. Greater than 50% of time was spent in counseling and coordination of care.  Follow-Up Instructions: No follow-ups on file.   Kimberly Reyes, CMA  Note - This record has been created using Editor, commissioning.  Chart creation errors have been sought, but may not always  have been located. Such creation errors do not reflect on  the standard of medical care.

## 2019-02-11 NOTE — Progress Notes (Signed)
Office Visit Note  Patient: Kimberly Reyes             Date of Birth: 1956-02-28           MRN: [id number]             PCP: Sharilyn Sites, MD Referring: Sharilyn Sites, MD Visit Date: 02/14/2019 Occupation: @GUAROCC @  Subjective:  Pain in hands.    History of Present Illness: Kimberly Reyes is a 63 y.o. female with history of myofascial pain syndrome and osteoarthritis.  She stated she continues to have discomfort in her muscles and joints but is manageable.  The trochanteric bursitis has been better.  Her right knee joint just is swollen and she is not ready for total knee replacement yet.  She states she has been on prednisone low-dose along with Imuran and has been followed by Dr. Benson Norway who is been monitoring her labs.  Activities of Daily Living:  Patient reports morning stiffness for 30 minutes.   Patient Reports nocturnal pain.  Difficulty dressing/grooming: Denies Difficulty climbing stairs: Denies Difficulty getting out of chair: Denies Difficulty using hands for taps, buttons, cutlery, and/or writing: Denies  Review of Systems  Constitutional: Positive for weight gain. Negative for fatigue, night sweats and weight loss.  HENT: Negative for mouth sores, trouble swallowing, trouble swallowing, mouth dryness and nose dryness.   Eyes: Positive for dryness. Negative for pain, redness, itching and visual disturbance.  Respiratory: Negative for cough, shortness of breath, wheezing and difficulty breathing.   Cardiovascular: Negative for chest pain, palpitations, hypertension, irregular heartbeat and swelling in legs/feet.  Gastrointestinal: Positive for constipation. Negative for abdominal pain, blood in stool and diarrhea.  Endocrine: Negative for increased urination.  Genitourinary: Negative for painful urination, pelvic pain and vaginal dryness.  Musculoskeletal: Positive for arthralgias, joint pain and morning stiffness. Negative for joint swelling, myalgias, muscle  weakness, muscle tenderness and myalgias.  Skin: Negative for color change, rash, hair loss, redness, skin tightness, ulcers and sensitivity to sunlight.  Allergic/Immunologic: Negative for susceptible to infections.  Neurological: Positive for headaches. Negative for dizziness, light-headedness, memory loss, night sweats and weakness.  Hematological: Negative for bruising/bleeding tendency and swollen glands.  Psychiatric/Behavioral: Negative for depressed mood, confusion and sleep disturbance. The patient is not nervous/anxious.     PMFS History:  Patient Active Problem List   Diagnosis Date Noted  . Family hx of colon cancer 03/29/2018  . Elevated LFTs 08/06/2017  . Primary osteoarthritis of right hip 08/06/2017  . Primary osteoarthritis of both feet 08/06/2017  . Primary osteoarthritis of both hands 08/06/2017  . Myofascial pain 08/06/2017  . Hypothyroid 11/25/2014  . Hypothyroidism 11/25/2014    Past Medical History:  Diagnosis Date  . Anxiety   . Asthma   40 yrs ago   not current probem no inhaler use  . Headache(784.0)   . Hypothyroidism     Family History  Problem Relation Age of Onset  . Colon cancer Father   . CVA Mother   . Pneumonia Brother   . Cancer Sister        breast, thyroid   . Heart disease Sister   . Healthy Son    Past Surgical History:  Procedure Laterality Date  . ABDOMINAL HYSTERECTOMY     20 years ago, complete  . CHOLECYSTECTOMY N/A 11/03/2014   Procedure: LAPAROSCOPIC CHOLECYSTECTOMY WITH INTRAOPERATIVE CHOLANGIOGRAM;  Surgeon: Gayland Curry, MD;  Location: [facility];  Service: General;  Laterality: N/A;  . COLONOSCOPY N/A 03/27/2013  Procedure: COLONOSCOPY;  Surgeon: Rogene Houston, MD;  [facility]: [facility];  Service: Endoscopy;  Laterality: N/A;  1030  . COLONOSCOPY N/A 06/05/2018   Procedure: COLONOSCOPY;  Surgeon: Rogene Houston, MD;  Location: [facility];  Service: Endoscopy;  Laterality: N/A;  1025  . KNEE ARTHROSCOPY W/ MENISCAL  REPAIR Right   . TUBAL LIGATION     Social History   Social History Narrative  . Not on file    There is no immunization history on file for this patient.   Objective: Vital Signs: BP 117/74 (BP Location: Left Arm, Patient Position: Sitting, Cuff Size: Normal)   Pulse 85   Resp 13   Ht 5\' 8"  (1.727 m)   Wt 197 lb 6.4 oz (89.5 kg)   BMI 30.01 kg/m    Physical Exam Vitals signs and nursing note reviewed.  Constitutional:      Appearance: She is well-developed.  HENT:     Head: Normocephalic and atraumatic.  Eyes:     Conjunctiva/sclera: Conjunctivae normal.  Neck:     Musculoskeletal: Normal range of motion.  Cardiovascular:     Rate and Rhythm: Normal rate and regular rhythm.     Heart sounds: Normal heart sounds.  Pulmonary:     Effort: Pulmonary effort is normal.     Breath sounds: Normal breath sounds.  Abdominal:     General: Bowel sounds are normal.     Palpations: Abdomen is soft.  Lymphadenopathy:     Cervical: No cervical adenopathy.  Skin:    General: Skin is warm and dry.     Capillary Refill: Capillary refill takes less than 2 seconds.  Neurological:     Mental Status: She is alert and oriented to person, place, and time.  Psychiatric:        Behavior: Behavior normal.      Musculoskeletal Exam: C-spine thoracic and lumbar spine good range of motion.  Shoulder joints elbow joints wrist joints are in good range of motion.  She is some DIP and CMC thickening.  She has some discomfort with range of motion of her right hip joint and right knee joint.  Right knee joint was swollen.  She is some generalized hyperalgesia and positive tender points.  CDAI Exam: CDAI Score: Not documented Patient Global Assessment: Not documented; Provider Global Assessment: Not documented Swollen: Not documented; Tender: Not documented Joint Exam   Not documented   There is currently no information documented on the homunculus. Go to the Rheumatology activity and  complete the homunculus joint exam.  Investigation: No additional findings.  Imaging: No results found.  Recent Labs: Lab Results  Component Value Date   WBC 8.4 07/20/2017   HGB 11.1 (L) 07/20/2017   PLT 193 07/20/2017   NA 138 06/26/2017   K 3.8 06/26/2017   CL 100 06/26/2017   CO2 31 06/26/2017   GLUCOSE 97 06/26/2017   BUN 13 06/26/2017   CREATININE 0.98 06/26/2017   BILITOT 0.4 06/26/2017   ALKPHOS 153 (H) 03/13/2017   AST 19 06/26/2017   ALT 108 (H) 06/26/2017   PROT 7.1 06/26/2017   PROT 7.1 06/26/2017   ALBUMIN 3.7 03/13/2017   CALCIUM 9.3 06/26/2017   GFRAA 71 06/26/2017   QFTBGOLD NEGATIVE 06/26/2017    Speciality Comments: No specialty comments available.  Procedures:  No procedures performed Allergies: Patient has no known allergies.   Assessment / Plan:     Visit Diagnoses: Myofascial pain-she is in generalized pain positive  tender points.  The pain is overall manageable.  Primary osteoarthritis of both hands-joint protection muscle strengthening was discussed.  Primary osteoarthritis of right hip-she is working on weight loss which will be helpful.  Trochanteric bursitis of both hips-improved.  Primary osteoarthritis of both knees-she continues to have discomfort in her right knee joint which is swollen.  She is not ready for total knee replacement yet.  Primary osteoarthritis of both feet-proper fitting shoes were discussed.  Other kyphosis of thoracic region - DEXA scan 09/19/17 T score of -2.3. She is on long-term prednisone 5 mg by mouth daily. She takes a calcium and vitamin D supplement.  She will need bone density and January 2021.  Elevated LFTs - Current regimen includes Imuran 50 mg daily and prednisone 5 mg daily for autoimmune hepatitis managed by Dr. Benson Norway.    History of hypothyroidism   Orders: No orders of the defined types were placed in this encounter.  No orders of the defined types were placed in this encounter.    Face-to-face time spent with patient was 30 minutes. Greater than 50% of time was spent in counseling and coordination of care.  Follow-Up Instructions: Return in about 6 months (around 08/16/2019) for Osteoarthritis, MFPS,AD.   Bo Merino, MD  Note - This record has been created using Editor, commissioning.  Chart creation errors have been sought, but may not always  have been located. Such creation errors do not reflect on  the standard of medical care.

## 2019-02-14 ENCOUNTER — Other Ambulatory Visit: Payer: Self-pay

## 2019-02-14 ENCOUNTER — Encounter: Payer: Self-pay | Admitting: Rheumatology

## 2019-02-14 ENCOUNTER — Ambulatory Visit: Payer: 59 | Admitting: Rheumatology

## 2019-02-14 VITALS — BP 117/74 | HR 85 | Resp 13 | Ht 68.0 in | Wt 197.4 lb

## 2019-02-14 DIAGNOSIS — M19041 Primary osteoarthritis, right hand: Secondary | ICD-10-CM

## 2019-02-14 DIAGNOSIS — Z8639 Personal history of other endocrine, nutritional and metabolic disease: Secondary | ICD-10-CM

## 2019-02-14 DIAGNOSIS — R7989 Other specified abnormal findings of blood chemistry: Secondary | ICD-10-CM

## 2019-02-14 DIAGNOSIS — M19042 Primary osteoarthritis, left hand: Secondary | ICD-10-CM

## 2019-02-14 DIAGNOSIS — M19071 Primary osteoarthritis, right ankle and foot: Secondary | ICD-10-CM

## 2019-02-14 DIAGNOSIS — M7918 Myalgia, other site: Secondary | ICD-10-CM

## 2019-02-14 DIAGNOSIS — M7061 Trochanteric bursitis, right hip: Secondary | ICD-10-CM | POA: Diagnosis not present

## 2019-02-14 DIAGNOSIS — M1611 Unilateral primary osteoarthritis, right hip: Secondary | ICD-10-CM

## 2019-02-14 DIAGNOSIS — M7062 Trochanteric bursitis, left hip: Secondary | ICD-10-CM

## 2019-02-14 DIAGNOSIS — R945 Abnormal results of liver function studies: Secondary | ICD-10-CM

## 2019-02-14 DIAGNOSIS — M19072 Primary osteoarthritis, left ankle and foot: Secondary | ICD-10-CM

## 2019-02-14 DIAGNOSIS — M170 Bilateral primary osteoarthritis of knee: Secondary | ICD-10-CM

## 2019-02-14 DIAGNOSIS — M40294 Other kyphosis, thoracic region: Secondary | ICD-10-CM

## 2019-02-20 ENCOUNTER — Ambulatory Visit: Payer: Self-pay | Admitting: Rheumatology

## 2019-05-09 ENCOUNTER — Other Ambulatory Visit (HOSPITAL_COMMUNITY): Payer: Self-pay | Admitting: Family Medicine

## 2019-05-09 DIAGNOSIS — E2839 Other primary ovarian failure: Secondary | ICD-10-CM

## 2019-05-27 ENCOUNTER — Other Ambulatory Visit (HOSPITAL_COMMUNITY): Payer: 59

## 2019-05-27 ENCOUNTER — Encounter (HOSPITAL_COMMUNITY): Payer: Self-pay

## 2019-08-01 NOTE — Progress Notes (Deleted)
Office Visit Note  Patient: Kimberly Reyes             Date of Birth: 1956/06/02           MRN: [id number]             PCP: Sharilyn Sites, MD Referring: Sharilyn Sites, MD Visit Date: 08/15/2019 Occupation: @GUAROCC @  Subjective:  No chief complaint on file.   History of Present Illness: Kimberly Reyes is a 63 y.o. female ***   Activities of Daily Living:  Patient reports morning stiffness for *** {minute/hour:19697}.   Patient {ACTIONS;DENIES/REPORTS:21021675::"Denies"} nocturnal pain.  Difficulty dressing/grooming: {ACTIONS;DENIES/REPORTS:21021675::"Denies"} Difficulty climbing stairs: {ACTIONS;DENIES/REPORTS:21021675::"Denies"} Difficulty getting out of chair: {ACTIONS;DENIES/REPORTS:21021675::"Denies"} Difficulty using hands for taps, buttons, cutlery, and/or writing: {ACTIONS;DENIES/REPORTS:21021675::"Denies"}  No Rheumatology ROS completed.   PMFS History:  Patient Active Problem List   Diagnosis Date Noted  . Family hx of colon cancer 03/29/2018  . Elevated LFTs 08/06/2017  . Primary osteoarthritis of right hip 08/06/2017  . Primary osteoarthritis of both feet 08/06/2017  . Primary osteoarthritis of both hands 08/06/2017  . Myofascial pain 08/06/2017  . Hypothyroid 11/25/2014  . Hypothyroidism 11/25/2014    Past Medical History:  Diagnosis Date  . Anxiety   . Asthma   40 yrs ago   not current probem no inhaler use  . Headache(784.0)   . Hypothyroidism     Family History  Problem Relation Age of Onset  . Colon cancer Father   . CVA Mother   . Pneumonia Brother   . Cancer Sister        breast, thyroid   . Heart disease Sister   . Healthy Son    Past Surgical History:  Procedure Laterality Date  . ABDOMINAL HYSTERECTOMY     20 years ago, complete  . CHOLECYSTECTOMY N/A 11/03/2014   Procedure: LAPAROSCOPIC CHOLECYSTECTOMY WITH INTRAOPERATIVE CHOLANGIOGRAM;  Surgeon: Gayland Curry, MD;  Location: [facility];  Service: General;  Laterality: N/A;  .  COLONOSCOPY N/A 03/27/2013   Procedure: COLONOSCOPY;  Surgeon: Rogene Houston, MD;  [facility]: [facility];  Service: Endoscopy;  Laterality: N/A;  1030  . COLONOSCOPY N/A 06/05/2018   Procedure: COLONOSCOPY;  Surgeon: Rogene Houston, MD;  Location: [facility];  Service: Endoscopy;  Laterality: N/A;  1025  . KNEE ARTHROSCOPY W/ MENISCAL REPAIR Right   . TUBAL LIGATION     Social History   Social History Narrative  . Not on file    There is no immunization history on file for this patient.   Objective: Vital Signs: There were no vitals taken for this visit.   Physical Exam   Musculoskeletal Exam: ***  CDAI Exam: CDAI Score: - Patient Global: -; Provider Global: - Swollen: -; Tender: - Joint Exam   No joint exam has been documented for this visit   There is currently no information documented on the homunculus. Go to the Rheumatology activity and complete the homunculus joint exam.  Investigation: No additional findings.  Imaging: No results found.  Recent Labs: Lab Results  Component Value Date   WBC 8.4 07/20/2017   HGB 11.1 (L) 07/20/2017   PLT 193 07/20/2017   NA 138 06/26/2017   K 3.8 06/26/2017   CL 100 06/26/2017   CO2 31 06/26/2017   GLUCOSE 97 06/26/2017   BUN 13 06/26/2017   CREATININE 0.98 06/26/2017   BILITOT 0.4 06/26/2017   ALKPHOS 153 (H) 03/13/2017   AST 19 06/26/2017   ALT 108 (H)  06/26/2017   PROT 7.1 06/26/2017   PROT 7.1 06/26/2017   ALBUMIN 3.7 03/13/2017   CALCIUM 9.3 06/26/2017   GFRAA 71 06/26/2017   QFTBGOLD NEGATIVE 06/26/2017    Speciality Comments: No specialty comments available.  Procedures:  No procedures performed Allergies: Patient has no known allergies.   Assessment / Plan:     Visit Diagnoses: No diagnosis found.  Orders: No orders of the defined types were placed in this encounter.  No orders of the defined types were placed in this encounter.   Face-to-face time spent with patient was *** minutes.  Greater than 50% of time was spent in counseling and coordination of care.  Follow-Up Instructions: No follow-ups on file.   Ofilia Neas, PA-C  Note - This record has been created using Dragon software.  Chart creation errors have been sought, but may not always  have been located. Such creation errors do not reflect on  the standard of medical care.

## 2019-08-15 ENCOUNTER — Ambulatory Visit: Payer: 59 | Admitting: Rheumatology

## 2019-09-18 ENCOUNTER — Ambulatory Visit: Payer: Self-pay | Admitting: Rheumatology

## 2019-11-13 NOTE — Progress Notes (Signed)
Office Visit Note  Patient: Kimberly Reyes             Date of Birth: 01/23/1956           MRN: [id number]             PCP: Sharilyn Sites, MD Referring: Sharilyn Sites, MD Visit Date: 11/20/2019 Occupation: @GUAROCC @  Subjective:  Pain in both knees  History of Present Illness: KASIYA COCKROFT is a 64 y.o. female with history of myofascial pain and osteoarthritis.  She is taking Imuran 50 mg 1 tablet by mouth daily and prednisone 5 mg 1 tablet by mouth daily for the management of autoimmune hepatitis.  She continues to see Dr. Benson Norway on a regular basis and has lab work every 3 months.  According to the patient she will be unable to taper off of prednisone.  She states that she has noticed significant weight gain since being on prednisone for so long.  She continues to have difficulty sleeping at night and has been taking Aleve PM on a regular basis which is led to elevated creatinine.  She occasionally takes Ambien instead of Aleve p.m. but is apprehensive to take that on a nightly basis.  She continues to have chronic pain in both knee joints.  She states that she knows her right knee needs to be replaced but she is not ready to proceed with surgery at this time.  She has been walking 4 miles daily.  She continues to have discomfort due to trochanteric bursitis bilaterally.   Activities of Daily Living:  Patient reports morning stiffness for 20 minutes.   Patient Denies nocturnal pain.  Difficulty dressing/grooming: Denies Difficulty climbing stairs: Reports Difficulty getting out of chair: Denies Difficulty using hands for taps, buttons, cutlery, and/or writing: Denies  Review of Systems  Constitutional: Negative for fatigue.  HENT: Negative for mouth sores, mouth dryness and nose dryness.   Eyes: Negative for pain, itching, visual disturbance and dryness.  Respiratory: Negative for cough, hemoptysis, shortness of breath and difficulty breathing.   Cardiovascular: Negative for chest  pain, palpitations, hypertension and swelling in legs/feet.  Gastrointestinal: Positive for constipation. Negative for blood in stool and diarrhea.  Endocrine: Negative for increased urination.  Genitourinary: Negative for difficulty urinating and painful urination.  Musculoskeletal: Positive for arthralgias, joint pain, joint swelling and morning stiffness. Negative for myalgias, muscle weakness, muscle tenderness and myalgias.  Skin: Positive for hair loss. Negative for color change, pallor, rash, nodules/bumps, skin tightness, ulcers and sensitivity to sunlight.  Allergic/Immunologic: Negative for susceptible to infections.  Neurological: Negative for dizziness, numbness, headaches, memory loss and weakness.  Hematological: Negative for bruising/bleeding tendency and swollen glands.  Psychiatric/Behavioral: Positive for sleep disturbance. Negative for depressed mood and confusion. The patient is not nervous/anxious.     PMFS History:  Patient Active Problem List   Diagnosis Date Noted  . Family hx of colon cancer 03/29/2018  . Elevated LFTs 08/06/2017  . Primary osteoarthritis of right hip 08/06/2017  . Primary osteoarthritis of both feet 08/06/2017  . Primary osteoarthritis of both hands 08/06/2017  . Myofascial pain 08/06/2017  . Hypothyroid 11/25/2014  . Hypothyroidism 11/25/2014    Past Medical History:  Diagnosis Date  . Anxiety   . Asthma   40 yrs ago   not current probem no inhaler use  . Headache(784.0)   . Hypothyroidism     Family History  Problem Relation Age of Onset  . Colon cancer Father   . CVA  Mother   . Pneumonia Brother   . Cancer Sister        breast, thyroid   . Heart disease Sister   . Healthy Son    Past Surgical History:  Procedure Laterality Date  . ABDOMINAL HYSTERECTOMY     20 years ago, complete  . CHOLECYSTECTOMY N/A 11/03/2014   Procedure: LAPAROSCOPIC CHOLECYSTECTOMY WITH INTRAOPERATIVE CHOLANGIOGRAM;  Surgeon: Gayland Curry, MD;   Location: [facility];  Service: General;  Laterality: N/A;  . COLONOSCOPY N/A 03/27/2013   Procedure: COLONOSCOPY;  Surgeon: Rogene Houston, MD;  [facility];  Service: Endoscopy;  Laterality: N/A;  1030  . COLONOSCOPY N/A 06/05/2018   Procedure: COLONOSCOPY;  Surgeon: Rogene Houston, MD;  [facility];  Service: Endoscopy;  Laterality: N/A;  1025  . KNEE ARTHROSCOPY W/ MENISCAL REPAIR Right   . TUBAL LIGATION     Social History   Social History Narrative  . Not on file    There is no immunization history on file for this patient.   Objective: Vital Signs: BP 131/85 (BP Location: Left Arm, Patient Position: Sitting, Cuff Size: Normal)   Pulse 91   Ht 5\' 8"  (1.727 m)   Wt 196 lb (88.9 kg)   BMI 29.80 kg/m    Physical Exam Vitals and nursing note reviewed.  Constitutional:      Appearance: She is well-developed.  HENT:     Head: Normocephalic and atraumatic.  Eyes:     Conjunctiva/sclera: Conjunctivae normal.  Pulmonary:     Effort: Pulmonary effort is normal.  Abdominal:     General: Bowel sounds are normal.     Palpations: Abdomen is soft.  Musculoskeletal:     Cervical back: Normal range of motion.  Lymphadenopathy:     Cervical: No cervical adenopathy.  Skin:    General: Skin is warm and dry.     Capillary Refill: Capillary refill takes less than 2 seconds.  Neurological:     Mental Status: She is alert and oriented to person, place, and time.  Psychiatric:        Behavior: Behavior normal.      Musculoskeletal Exam: C-spine, thoracic spine, lumbar spine good range of motion.  Shoulder joints, elbow joints, wrist joints, MCPs, PIPs and DIPs good range of motion with no synovitis.  She has complete fist formation bilaterally.  She has PIP and DIP thickening consistent with osteoarthritis of both hands.  She has tenderness of the right second MCP joint.  Hip joints have good range of motion with no discomfort at this time.  She has tenderness  over bilateral trochanteric bursa.  Right knee warmth noted.  She has right knee crepitus on exam.   CDAI Exam: CDAI Score: -- Patient Global: --; Provider Global: -- Swollen: --; Tender: -- Joint Exam 11/20/2019   No joint exam has been documented for this visit   There is currently no information documented on the homunculus. Go to the Rheumatology activity and complete the homunculus joint exam.  Investigation: No additional findings.  Imaging: No results found.  Recent Labs: Lab Results  Component Value Date   WBC 8.4 07/20/2017   HGB 11.1 (L) 07/20/2017   PLT 193 07/20/2017   NA 138 06/26/2017   K 3.8 06/26/2017   CL 100 06/26/2017   CO2 31 06/26/2017   GLUCOSE 97 06/26/2017   BUN 13 06/26/2017   CREATININE 0.98 06/26/2017   BILITOT 0.4 06/26/2017   ALKPHOS 153 (H)  03/13/2017   AST 19 06/26/2017   ALT 108 (H) 06/26/2017   PROT 7.1 06/26/2017   PROT 7.1 06/26/2017   ALBUMIN 3.7 03/13/2017   CALCIUM 9.3 06/26/2017   GFRAA 71 06/26/2017   QFTBGOLD NEGATIVE 06/26/2017    Speciality Comments: No specialty comments available.  Procedures:  No procedures performed Allergies: Patient has no known allergies.   Assessment / Plan:     Visit Diagnoses: Myofascial pain: She has not experiencing any myofascial pain at this time.  She remains active and walks 4 miles daily.  She has difficulty sleeping at night and has been taking Aleve PM as needed.  We discussed avoiding Aleve PM at this time due to elevated creatinine and recent lab work.  She occasionally takes Ambien 5 mg by mouth at bedtime which helps her sleep at night.  She plans on taking the reduced dose of Ambien as needed.  Primary osteoarthritis of both hands: She has PIP and DIP thickening consistent with osteoarthritis of both hands.  She has no tenderness or synovitis on exam.  She has complete fist formation bilaterally.  She has no difficulties with ADLs.  Joint protection and muscle strengthening were  discussed.  Primary osteoarthritis of right hip: She has good range of motion on exam with no discomfort.  Trochanteric bursitis of both hips: She has tenderness over bilateral trochanteric bursa.  She was encouraged to perform stretching exercises daily.  Primary osteoarthritis of both knees: Chronic pain.  She has warmth and inflammation in the right knee joint on exam.  Right knee crepitus noted.  She is followed by Dr. Noemi Chapel and is aware that she needs a right knee replacement but is not ready to proceed with surgery at this time.  She continues to walk 4 miles daily for exercise.  Primary osteoarthritis of both feet: She is not having any discomfort in her feet at this time.  She wears proper fitting shoes.  Other kyphosis of thoracic region - DEXA scan 09/19/17 T score of -2.3. She is on long-term prednisone 5 mg by mouth daily.  Future order for DEXA will be placed today.  Elevated LFTs - Current regimen includes Imuran 50 mg daily and prednisone 5 mg daily for autoimmune hepatitis managed by Dr. Benson Norway.  According to the patient she would not be tapering off of prednisone or increasing the dose of Imuran at this time.  Dr. Benson Norway continues to check her lab work every 3 months.  Osteopenia of multiple sites: DEXA on 09/19/2017: BMD as determined from [facility] Right is 0.718 g/cm2 with a T-Score of -2.3.  Future order for DEXA was placed today.  She continues take calcium and vitamin D supplements on a daily basis.   On prednisone therapy: She is on long-term prednisone 5 mg 1 tablet by mouth daily.  An order for DEXA was placed today.  The patient is very discouraged by the weight gain she has been noticing.  She has been walking 4 miles daily for exercise.  We discussed establishing care at the [facility] weight loss clinic and she is going to consider this.  History of hypothyroidism  Orders: Orders Placed This Encounter  Procedures  . DG BONE DENSITY (DXA)   No orders of the defined  types were placed in this encounter.  Follow-Up Instructions: Return in about 6 months (around 05/22/2020) for Osteoarthritis.   Ofilia Neas, PA-C   I examined and evaluated the patient with Hazel Sams PA.  Patient continues  to experience some discomfort in her joints due to osteoarthritis.  No synovitis was noted.  She has been followed by Dr.Hung autoimmune hepatitis.  The plan of care was discussed as noted above.  Bo Merino, MD  Note - This record has been created using Editor, commissioning.  Chart creation errors have been sought, but may not always  have been located. Such creation errors do not reflect on  the standard of medical care.

## 2019-11-20 ENCOUNTER — Encounter: Payer: Self-pay | Admitting: Rheumatology

## 2019-11-20 ENCOUNTER — Ambulatory Visit (INDEPENDENT_AMBULATORY_CARE_PROVIDER_SITE_OTHER): Payer: Medicare Other | Admitting: Rheumatology

## 2019-11-20 ENCOUNTER — Other Ambulatory Visit: Payer: Self-pay

## 2019-11-20 VITALS — BP 131/85 | HR 91 | Ht 68.0 in | Wt 196.0 lb

## 2019-11-20 DIAGNOSIS — M7061 Trochanteric bursitis, right hip: Secondary | ICD-10-CM | POA: Diagnosis not present

## 2019-11-20 DIAGNOSIS — M40294 Other kyphosis, thoracic region: Secondary | ICD-10-CM

## 2019-11-20 DIAGNOSIS — M1611 Unilateral primary osteoarthritis, right hip: Secondary | ICD-10-CM

## 2019-11-20 DIAGNOSIS — R7989 Other specified abnormal findings of blood chemistry: Secondary | ICD-10-CM

## 2019-11-20 DIAGNOSIS — M170 Bilateral primary osteoarthritis of knee: Secondary | ICD-10-CM

## 2019-11-20 DIAGNOSIS — M19041 Primary osteoarthritis, right hand: Secondary | ICD-10-CM

## 2019-11-20 DIAGNOSIS — M7062 Trochanteric bursitis, left hip: Secondary | ICD-10-CM

## 2019-11-20 DIAGNOSIS — M19072 Primary osteoarthritis, left ankle and foot: Secondary | ICD-10-CM

## 2019-11-20 DIAGNOSIS — Z8639 Personal history of other endocrine, nutritional and metabolic disease: Secondary | ICD-10-CM

## 2019-11-20 DIAGNOSIS — Z7952 Long term (current) use of systemic steroids: Secondary | ICD-10-CM

## 2019-11-20 DIAGNOSIS — M7918 Myalgia, other site: Secondary | ICD-10-CM | POA: Diagnosis not present

## 2019-11-20 DIAGNOSIS — M8589 Other specified disorders of bone density and structure, multiple sites: Secondary | ICD-10-CM

## 2019-11-20 DIAGNOSIS — M19042 Primary osteoarthritis, left hand: Secondary | ICD-10-CM

## 2019-11-20 DIAGNOSIS — M19071 Primary osteoarthritis, right ankle and foot: Secondary | ICD-10-CM

## 2019-12-04 ENCOUNTER — Telehealth: Payer: Self-pay | Admitting: Rheumatology

## 2019-12-04 NOTE — Telephone Encounter (Signed)
Forestine Na will call patient to schedule appt.

## 2019-12-04 NOTE — Telephone Encounter (Signed)
Patient called checking on the status of her bone density scan.  Patient states at her appointment on 11/20/19 Dr. Estanislado Pandy was placing an order and she hasn't received a call to schedule an appointment.

## 2019-12-08 ENCOUNTER — Inpatient Hospital Stay (HOSPITAL_COMMUNITY): Admission: RE | Admit: 2019-12-08 | Payer: Medicare Other | Source: Ambulatory Visit

## 2019-12-22 ENCOUNTER — Inpatient Hospital Stay (HOSPITAL_COMMUNITY): Admission: RE | Admit: 2019-12-22 | Payer: Medicare Other | Source: Ambulatory Visit

## 2020-01-05 ENCOUNTER — Other Ambulatory Visit: Payer: Self-pay

## 2020-01-05 ENCOUNTER — Ambulatory Visit (HOSPITAL_COMMUNITY)
Admission: RE | Admit: 2020-01-05 | Discharge: 2020-01-05 | Disposition: A | Discharge status: Home / Self Care | Payer: Medicare Other | Source: Ambulatory Visit | Attending: Family Medicine | Admitting: Family Medicine

## 2020-01-05 DIAGNOSIS — E2839 Other primary ovarian failure: Secondary | ICD-10-CM | POA: Insufficient documentation

## 2020-01-06 ENCOUNTER — Telehealth: Payer: Self-pay | Admitting: Rheumatology

## 2020-01-06 NOTE — Telephone Encounter (Signed)
Reviewed DEXA with Dr. Estanislado Pandy.  She recommended for the patient to take a drug holiday and hold reclast for the next 2 years until her next DEXA.  She is in the osteopenia range and had an improvement in her BMD of spine.

## 2020-01-06 NOTE — Telephone Encounter (Signed)
Can you review this please? DEXA is under imaging.

## 2020-01-06 NOTE — Telephone Encounter (Signed)
Patient left a voicemail stating she had a bone scan yesterday that her PCP ordered and received a message from Dr. Hilma Favors to stay on calcium and to do weight bearing exercises.  Patient states he also told her "she hardly has osteoporosis at all."  Patient is asking if Dr. Estanislado Pandy would review the bone scan and let her know if it is better or worse than 2 years ago.  Patient also states "if she hardly has osteoporosis at all" do I need to have my Reclast infusions? I

## 2020-01-07 NOTE — Telephone Encounter (Signed)
Advised patient that Dr. Estanislado Pandy recommended for the patient to take a drug holiday and hold reclast for the next 2 years until her next DEXA.  She is in the osteopenia range and had an improvement in her BMD of spine. Patient verbalized understanding.

## 2020-05-06 ENCOUNTER — Ambulatory Visit: Payer: Medicare Other | Attending: Internal Medicine

## 2020-05-06 DIAGNOSIS — Z23 Encounter for immunization: Secondary | ICD-10-CM

## 2020-05-06 NOTE — Progress Notes (Signed)
   Covid-19 Vaccination Clinic  Name:  Kimberly Reyes    MRN: [id number] DOB: 1956-08-05  05/06/2020  [name] was observed post Covid-19 immunization for 15 minutes without incident. She was provided with Vaccine Information Sheet and instruction to access the V-Safe system.   [name] was instructed to call 911 with any severe reactions post vaccine: Marland Kitchen Difficulty breathing  . Swelling of face and throat  . A fast heartbeat  . A bad rash all over body  . Dizziness and weakness

## 2020-05-11 NOTE — Progress Notes (Signed)
Office Visit Note  Patient: Kimberly Reyes             Date of Birth: [birth year]           MRN: [id number]             PCP: Sharilyn Sites, MD Referring: Sharilyn Sites, MD Visit Date: 05/25/2020 Occupation: @GUAROCC @  Subjective:  Joint Pain (bilateral hands and knees)   History of Present Illness: Kimberly Reyes is a 64 y.o. female with history of myofascial pain, osteoarthritis and autoimmune hepatitis. She states she continues to have some discomfort in her hands and knees. She denies any joint swelling. She also has some generalized pain and discomfort from myofascial pain. She has been followed by Dr. Benson Norway for autoimmune hepatitis. She is on Imuran 50 mg p.o. daily and prednisone 5 mg p.o. daily. She states she is unable to taper prednisone. Her labs have been followed by Dr. Benson Norway which has been stable except that her creatinine has been elevated lately. She states Dr. Benson Norway has referred her to nephrology.  Activities of Daily Living:  Patient reports morning stiffness for 20  minutes.   Patient Reports nocturnal pain.  Difficulty dressing/grooming: Denies Difficulty climbing stairs: Reports Difficulty getting out of chair: Denies Difficulty using hands for taps, buttons, cutlery, and/or writing: Denies  Review of Systems  Constitutional: Positive for fatigue.  HENT: Positive for mouth dryness and nose dryness. Negative for mouth sores.   Eyes: Positive for dryness. Negative for pain and visual disturbance.  Respiratory: Negative for shortness of breath and difficulty breathing.   Cardiovascular: Positive for swelling in legs/feet. Negative for chest pain.  Gastrointestinal: Positive for constipation. Negative for diarrhea.  Endocrine: Negative for increased urination.  Genitourinary: Negative for difficulty urinating and painful urination.  Musculoskeletal: Positive for arthralgias, joint pain and morning stiffness. Negative for joint swelling, muscle weakness and muscle  tenderness.  Skin: Negative for color change, rash, hair loss and redness.  Allergic/Immunologic: Positive for susceptible to infections.  Neurological: Positive for headaches. Negative for dizziness.  Hematological: Negative for bruising/bleeding tendency.  Psychiatric/Behavioral: Positive for sleep disturbance.    PMFS History:  Patient Active Problem List   Diagnosis Date Noted  . Family hx of colon cancer 03/29/2018  . Elevated LFTs 08/06/2017  . Primary osteoarthritis of right hip 08/06/2017  . Primary osteoarthritis of both feet 08/06/2017  . Primary osteoarthritis of both hands 08/06/2017  . Myofascial pain 08/06/2017  . Hypothyroid 11/25/2014  . Hypothyroidism 11/25/2014    Past Medical History:  Diagnosis Date  . Anxiety   . Asthma   40 yrs ago   not current probem no inhaler use  . Headache(784.0)   . Hypothyroidism     Family History  Problem Relation Age of Onset  . Colon cancer Father   . CVA Mother   . Pneumonia Brother   . Cancer Sister        breast, thyroid   . Heart disease Sister   . Healthy Son    Past Surgical History:  Procedure Laterality Date  . ABDOMINAL HYSTERECTOMY     20 years ago, complete  . CHOLECYSTECTOMY N/A 11/03/2014   Procedure: LAPAROSCOPIC CHOLECYSTECTOMY WITH INTRAOPERATIVE CHOLANGIOGRAM;  Surgeon: Gayland Curry, MD;  Location: [facility];  Service: General;  Laterality: N/A;  . COLONOSCOPY N/A 03/27/2013   Procedure: COLONOSCOPY;  Surgeon: Rogene Houston, MD;  Location: [facility];  Service: Endoscopy;  Laterality: N/A;  1030  . COLONOSCOPY  N/A 06/05/2018   Procedure: COLONOSCOPY;  Surgeon: Rogene Houston, MD;  Location: [facility];  Service: Endoscopy;  Laterality: N/A;  1025  . KNEE ARTHROSCOPY W/ MENISCAL REPAIR Right   . TUBAL LIGATION     Social History   Social History Narrative  . Not on file   Immunization History  Administered Date(s) Administered  . PFIZER SARS-COV-2 Vaccination 05/06/2020      Objective: Vital Signs: BP 126/79 (BP Location: Left Arm, Patient Position: Sitting, Cuff Size: Small)   Pulse (!) 111   Resp 14   Ht 5\' 8"  (1.727 m)   Wt 201 lb 9.6 oz (91.4 kg)   BMI 30.65 kg/m    Physical Exam Vitals and nursing note reviewed.  Constitutional:      Appearance: She is well-developed.  HENT:     Head: Normocephalic and atraumatic.  Eyes:     Conjunctiva/sclera: Conjunctivae normal.  Cardiovascular:     Rate and Rhythm: Normal rate and regular rhythm.     Heart sounds: Normal heart sounds.  Pulmonary:     Effort: Pulmonary effort is normal.     Breath sounds: Normal breath sounds.  Abdominal:     General: Bowel sounds are normal.     Palpations: Abdomen is soft.  Musculoskeletal:     Cervical back: Normal range of motion.  Lymphadenopathy:     Cervical: No cervical adenopathy.  Skin:    General: Skin is warm and dry.     Capillary Refill: Capillary refill takes less than 2 seconds.  Neurological:     Mental Status: She is alert and oriented to person, place, and time.  Psychiatric:        Behavior: Behavior normal.      Musculoskeletal Exam: C-spine was in good range of motion. She has thoracic kyphosis. She had good range of motion of her lumbar spine without discomfort. Shoulder joints, elbow joints, wrist joints with good range of motion. She has bilateral PIP and DIP thickening with no synovitis. Hip joints in good range of motion. She has warmth in her right knee joint. She had no tenderness over ankles or MTPs.  CDAI Exam: CDAI Score: -- Patient Global: --; Provider Global: -- Swollen: --; Tender: -- Joint Exam 05/25/2020   No joint exam has been documented for this visit   There is currently no information documented on the homunculus. Go to the Rheumatology activity and complete the homunculus joint exam.  Investigation: No additional findings.  Imaging: No results found.  Recent Labs: Lab Results  Component Value Date   WBC  8.4 07/20/2017   HGB 11.1 (L) 07/20/2017   PLT 193 07/20/2017   NA 138 06/26/2017   K 3.8 06/26/2017   CL 100 06/26/2017   CO2 31 06/26/2017   GLUCOSE 97 06/26/2017   BUN 13 06/26/2017   CREATININE 0.98 06/26/2017   BILITOT 0.4 06/26/2017   ALKPHOS 153 (H) 03/13/2017   AST 19 06/26/2017   ALT 108 (H) 06/26/2017   PROT 7.1 06/26/2017   PROT 7.1 06/26/2017   ALBUMIN 3.7 03/13/2017   CALCIUM 9.3 06/26/2017   GFRAA 71 06/26/2017   QFTBGOLD NEGATIVE 06/26/2017    Speciality Comments: No specialty comments available.  Procedures:  No procedures performed Allergies: Patient has no known allergies.   Assessment / Plan:     Visit Diagnoses: Myofascial pain-she continues to have some generalized pain and discomfort from myofascial pain. Need for regular exercise was discussed.  Primary osteoarthritis of  both hands-joint protection muscle strengthening was discussed. I did not see any synovitis.  Primary osteoarthritis of right hip-she has some discomfort range of motion.  Trochanteric bursitis of both hips-ITB stretches were demonstrated and discussed.  Primary osteoarthritis of both knees-she has severe osteoarthritis in her bilateral knee joints. She has been followed by Dr. Para March. She states she will require right knee replacement eventually.  Primary osteoarthritis of both feet-proper fitting shoes were discussed.  Other kyphosis of thoracic region  Autoimmune hepatitis (HCC)-she has been followed by Dr. Benson Norway. She is on Imuran and prednisone combination. She states she is unable to taper prednisone.   High risk medication use - Imuran 50 mg p.o. daily, prednisone 5 mg p.o. daily. Her labs are monitored by Dr. Benson Norway. According to the patient recently she was found to have elevated creatinine and has been referred to nephrology.  On prednisone therapy - She is on long-term prednisone 5 mg 1 tablet by mouth daily.    Osteopenia of multiple sites -I reviewed her DEXA scan  today. DEXA on January 05, 2020 showed T score of -2.3, no significant change in BMD when compared to the DEXA scan of 2019. She is only on calcium and vitamin D.  History of hypothyroidism  Educated about COVID-19 virus infection-she is fully vaccinated against COVID-19. She also received her booster. Use of mask, social distancing and hand hygiene was discussed. I also discussed the possible use of monoclonal antibody infusion in case she requires COVID-19 infection.  Orders: No orders of the defined types were placed in this encounter.  No orders of the defined types were placed in this encounter.   Follow-Up Instructions: Return for Osteoarthritis.   Bo Merino, MD  Note - This record has been created using Editor, commissioning.  Chart creation errors have been sought, but may not always  have been located. Such creation errors do not reflect on  the standard of medical care.

## 2020-05-25 ENCOUNTER — Other Ambulatory Visit: Payer: Self-pay

## 2020-05-25 ENCOUNTER — Ambulatory Visit (INDEPENDENT_AMBULATORY_CARE_PROVIDER_SITE_OTHER): Payer: Medicare Other | Admitting: Rheumatology

## 2020-05-25 ENCOUNTER — Encounter: Payer: Self-pay | Admitting: Rheumatology

## 2020-05-25 VITALS — BP 126/79 | HR 111 | Resp 14 | Ht 68.0 in | Wt 201.6 lb

## 2020-05-25 DIAGNOSIS — Z8639 Personal history of other endocrine, nutritional and metabolic disease: Secondary | ICD-10-CM

## 2020-05-25 DIAGNOSIS — M19041 Primary osteoarthritis, right hand: Secondary | ICD-10-CM

## 2020-05-25 DIAGNOSIS — M1611 Unilateral primary osteoarthritis, right hip: Secondary | ICD-10-CM | POA: Diagnosis not present

## 2020-05-25 DIAGNOSIS — Z79899 Other long term (current) drug therapy: Secondary | ICD-10-CM

## 2020-05-25 DIAGNOSIS — M19072 Primary osteoarthritis, left ankle and foot: Secondary | ICD-10-CM

## 2020-05-25 DIAGNOSIS — M7918 Myalgia, other site: Secondary | ICD-10-CM

## 2020-05-25 DIAGNOSIS — M19071 Primary osteoarthritis, right ankle and foot: Secondary | ICD-10-CM

## 2020-05-25 DIAGNOSIS — M40294 Other kyphosis, thoracic region: Secondary | ICD-10-CM

## 2020-05-25 DIAGNOSIS — M7062 Trochanteric bursitis, left hip: Secondary | ICD-10-CM

## 2020-05-25 DIAGNOSIS — Z7189 Other specified counseling: Secondary | ICD-10-CM

## 2020-05-25 DIAGNOSIS — M170 Bilateral primary osteoarthritis of knee: Secondary | ICD-10-CM

## 2020-05-25 DIAGNOSIS — M19042 Primary osteoarthritis, left hand: Secondary | ICD-10-CM

## 2020-05-25 DIAGNOSIS — M7061 Trochanteric bursitis, right hip: Secondary | ICD-10-CM | POA: Diagnosis not present

## 2020-05-25 DIAGNOSIS — Z7952 Long term (current) use of systemic steroids: Secondary | ICD-10-CM

## 2020-05-25 DIAGNOSIS — K754 Autoimmune hepatitis: Secondary | ICD-10-CM

## 2020-05-25 DIAGNOSIS — M8589 Other specified disorders of bone density and structure, multiple sites: Secondary | ICD-10-CM

## 2020-05-25 NOTE — Patient Instructions (Addendum)
COVID-19 vaccine recommendations:   COVID-19 vaccine is recommended for everyone (unless you are allergic to a vaccine component), even if you are on a medication that suppresses your immune system.   If you are on Methotrexate, Cellcept (mycophenolate), Rinvoq, Morrie Sheldon, and Olumiant- hold the medication for 1 week after each vaccine. Hold Methotrexate for 2 weeks after the single dose COVID-19 vaccine.   If you are on Orencia subcutaneous injection - hold medication one week prior to and one week after the first COVID-19 vaccine dose (only).   If you are on Orencia IV infusions- time vaccination administration so that the first COVID-19 vaccination will occur four weeks after the infusion and postpone the subsequent infusion by one week.   If you are on Cyclophosphamide or Rituxan infusions please contact your doctor prior to receiving the COVID-19 vaccine.   Do not take Tylenol or any anti-inflammatory medications (NSAIDs) 24 hours prior to the COVID-19 vaccination.   There is no direct evidence about the efficacy of the COVID-19 vaccine in individuals who are on medications that suppress the immune system.   Even if you are fully vaccinated, and you are on any medications that suppress your immune system, please continue to wear a mask, maintain at least six feet social distance and practice hand hygiene.   If you develop a COVID-19 infection, please contact your PCP or our office to determine if you need antibody infusion.  The booster vaccine is now available for immunocompromised patients. It is advised that if you had Pfizer vaccine you should get Coca[employer]a booster.  If you had a Moderna vaccine then you should get a Moderna booster. Johnson and Wynetta Emery does not have a booster vaccine at this time.  Please see the following web sites for updated information.    https://www.rheumatology.org/Portals/0/Files/COVID-19-Vaccination-Patient-Resources.pdf  https://www.rheumatology.org/About-Us/Newsroom/Press-Releases/ID/1159  Hand Exercises Hand exercises can be helpful for almost anyone. These exercises can strengthen the hands, improve flexibility and movement, and increase blood flow to the hands. These results can make work and daily tasks easier. Hand exercises can be especially helpful for people who have joint pain from arthritis or have nerve damage from overuse (carpal tunnel syndrome). These exercises can also help people who have injured a hand. Exercises Most of these hand exercises are gentle stretching and motion exercises. It is usually safe to do them often throughout the day. Warming up your hands before exercise may help to reduce stiffness. You can do this with gentle massage or by placing your hands in warm water for 10-15 minutes. It is normal to feel some stretching, pulling, tightness, or mild discomfort as you begin new exercises. This will gradually improve. Stop an exercise right away if you feel sudden, severe pain or your pain gets worse. Ask your health care provider which exercises are best for you. Knuckle bend or "claw" fist 1. Stand or sit with your arm, hand, and all five fingers pointed straight up. Make sure to keep your wrist straight during the exercise. 2. Gently bend your fingers down toward your palm until the tips of your fingers are touching the top of your palm. Keep your big knuckle straight and just bend the small knuckles in your fingers. 3. Hold this position for __________ seconds. 4. Straighten (extend) your fingers back to the starting position. Repeat this exercise 5-10 times with each hand. Full finger fist 1. Stand or sit with your arm, hand, and all five fingers pointed straight up. Make sure to keep your wrist straight during the exercise.  2. Gently bend your fingers into your palm until the tips of your  fingers are touching the middle of your palm. 3. Hold this position for __________ seconds. 4. Extend your fingers back to the starting position, stretching every joint fully. Repeat this exercise 5-10 times with each hand. Straight fist 1. Stand or sit with your arm, hand, and all five fingers pointed straight up. Make sure to keep your wrist straight during the exercise. 2. Gently bend your fingers at the big knuckle, where your fingers meet your hand, and the middle knuckle. Keep the knuckle at the tips of your fingers straight and try to touch the bottom of your palm. 3. Hold this position for __________ seconds. 4. Extend your fingers back to the starting position, stretching every joint fully. Repeat this exercise 5-10 times with each hand. Tabletop 1. Stand or sit with your arm, hand, and all five fingers pointed straight up. Make sure to keep your wrist straight during the exercise. 2. Gently bend your fingers at the big knuckle, where your fingers meet your hand, as far down as you can while keeping the small knuckles in your fingers straight. Think of forming a tabletop with your fingers. 3. Hold this position for __________ seconds. 4. Extend your fingers back to the starting position, stretching every joint fully. Repeat this exercise 5-10 times with each hand. Finger spread 1. Place your hand flat on a table with your palm facing down. Make sure your wrist stays straight as you do this exercise. 2. Spread your fingers and thumb apart from each other as far as you can until you feel a gentle stretch. Hold this position for __________ seconds. 3. Bring your fingers and thumb tight together again. Hold this position for __________ seconds. Repeat this exercise 5-10 times with each hand. Making circles 1. Stand or sit with your arm, hand, and all five fingers pointed straight up. Make sure to keep your wrist straight during the exercise. 2. Make a circle by touching the tip of your  thumb to the tip of your index finger. 3. Hold for __________ seconds. Then open your hand wide. 4. Repeat this motion with your thumb and each finger on your hand. Repeat this exercise 5-10 times with each hand. Thumb motion 1. Sit with your forearm resting on a table and your wrist straight. Your thumb should be facing up toward the ceiling. Keep your fingers relaxed as you move your thumb. 2. Lift your thumb up as high as you can toward the ceiling. Hold for __________ seconds. 3. Bend your thumb across your palm as far as you can, reaching the tip of your thumb for the small finger (pinkie) side of your palm. Hold for __________ seconds. Repeat this exercise 5-10 times with each hand. Grip strengthening  1. Hold a stress ball or other soft ball in the middle of your hand. 2. Slowly increase the pressure, squeezing the ball as much as you can without causing pain. Think of bringing the tips of your fingers into the middle of your palm. All of your finger joints should bend when doing this exercise. 3. Hold your squeeze for __________ seconds, then relax. Repeat this exercise 5-10 times with each hand. Contact a health care provider if:  Your hand pain or discomfort gets much worse when you do an exercise.  Your hand pain or discomfort does not improve within 2 hours after you exercise. If you have any of these problems, stop doing these exercises right  away. Do not do them again unless your health care provider says that you can. Get help right away if:  You develop sudden, severe hand pain or swelling. If this happens, stop doing these exercises right away. Do not do them again unless your health care provider says that you can. This information is not intended to replace advice given to you by your health care provider. Make sure you discuss any questions you have with your health care provider. Document Revised: 12/19/2018 Document Reviewed: 08/29/2018 Elsevier Patient Education   2020 [employer], 15(4), [phone]. Retrieved June 17, 2018 from http://clinicalkey.com/nursing">  Knee Exercises Ask your health care provider which exercises are safe for you. Do exercises exactly as told by your health care provider and adjust them as directed. It is normal to feel mild stretching, pulling, tightness, or discomfort as you do these exercises. Stop right away if you feel sudden pain or your pain gets worse. Do not begin these exercises until told by your health care provider. Stretching and range-of-motion exercises These exercises warm up your muscles and joints and improve the movement and flexibility of your knee. These exercises also help to relieve pain and swelling. Knee extension, prone 1. Lie on your abdomen (prone position) on a bed. 2. Place your left / right knee just beyond the edge of the surface so your knee is not on the bed. You can put a towel under your left / right thigh just above your kneecap for comfort. 3. Relax your leg muscles and allow gravity to straighten your knee (extension). You should feel a stretch behind your left / right knee. 4. Hold this position for __________ seconds. 5. Scoot up so your knee is supported between repetitions. Repeat __________ times. Complete this exercise __________ times a day. Knee flexion, active  1. Lie on your back with both legs straight. If this causes back discomfort, bend your left / right knee so your foot is flat on the floor. 2. Slowly slide your left / right heel back toward your buttocks. Stop when you feel a gentle stretch in the front of your knee or thigh (flexion). 3. Hold this position for __________ seconds. 4. Slowly slide your left / right heel back to the starting position. Repeat __________ times. Complete this exercise __________ times a day. Quadriceps stretch, prone  1. Lie on your abdomen on a firm surface, such as a bed or padded floor. 2. Bend your left /  right knee and hold your ankle. If you cannot reach your ankle or pant leg, loop a belt around your foot and grab the belt instead. 3. Gently pull your heel toward your buttocks. Your knee should not slide out to the side. You should feel a stretch in the front of your thigh and knee (quadriceps). 4. Hold this position for __________ seconds. Repeat __________ times. Complete this exercise __________ times a day. Hamstring, supine 1. Lie on your back (supine position). 2. Loop a belt or towel over the ball of your left / right foot. The ball of your foot is on the walking surface, right under your toes. 3. Straighten your left / right knee and slowly pull on the belt to raise your leg until you feel a gentle stretch behind your knee (hamstring). ? Do not let your knee bend while you do this. ? Keep your other leg flat on the floor. 4. Hold this position for __________ seconds. Repeat __________ times. Complete this exercise __________  times a day. Strengthening exercises These exercises build strength and endurance in your knee. Endurance is the ability to use your muscles for a long time, even after they get tired. Quadriceps, isometric This exercise stretches the muscles in front of your thigh (quadriceps) without moving your knee joint (isometric). 1. Lie on your back with your left / right leg extended and your other knee bent. Put a rolled towel or small pillow under your knee if told by your health care provider. 2. Slowly tense the muscles in the front of your left / right thigh. You should see your kneecap slide up toward your hip or see increased dimpling just above the knee. This motion will push the back of the knee toward the floor. 3. For __________ seconds, hold the muscle as tight as you can without increasing your pain. 4. Relax the muscles slowly and completely. Repeat __________ times. Complete this exercise __________ times a day. Straight leg raises This exercise stretches  the muscles in front of your thigh (quadriceps) and the muscles that move your hips (hip flexors). 1. Lie on your back with your left / right leg extended and your other knee bent. 2. Tense the muscles in the front of your left / right thigh. You should see your kneecap slide up or see increased dimpling just above the knee. Your thigh may even shake a bit. 3. Keep these muscles tight as you raise your leg 4-6 inches (10-15 cm) off the floor. Do not let your knee bend. 4. Hold this position for __________ seconds. 5. Keep these muscles tense as you lower your leg. 6. Relax your muscles slowly and completely after each repetition. Repeat __________ times. Complete this exercise __________ times a day. Hamstring, isometric 1. Lie on your back on a firm surface. 2. Bend your left / right knee about __________ degrees. 3. Dig your left / right heel into the surface as if you are trying to pull it toward your buttocks. Tighten the muscles in the back of your thighs (hamstring) to "dig" as hard as you can without increasing any pain. 4. Hold this position for __________ seconds. 5. Release the tension gradually and allow your muscles to relax completely for __________ seconds after each repetition. Repeat __________ times. Complete this exercise __________ times a day. Hamstring curls If told by your health care provider, do this exercise while wearing ankle weights. Begin with __________ lb weights. Then increase the weight by 1 lb (0.5 kg) increments. Do not wear ankle weights that are more than __________ lb. 1. Lie on your abdomen with your legs straight. 2. Bend your left / right knee as far as you can without feeling pain. Keep your hips flat against the floor. 3. Hold this position for __________ seconds. 4. Slowly lower your leg to the starting position. Repeat __________ times. Complete this exercise __________ times a day. Squats This exercise strengthens the muscles in front of your thigh  and knee (quadriceps). 1. Stand in front of a table, with your feet and knees pointing straight ahead. You may rest your hands on the table for balance but not for support. 2. Slowly bend your knees and lower your hips like you are going to sit in a chair. ? Keep your weight over your heels, not over your toes. ? Keep your lower legs upright so they are parallel with the table legs. ? Do not let your hips go lower than your knees. ? Do not bend lower than told by  your health care provider. ? If your knee pain increases, do not bend as low. 3. Hold the squat position for __________ seconds. 4. Slowly push with your legs to return to standing. Do not use your hands to pull yourself to standing. Repeat __________ times. Complete this exercise __________ times a day. Wall slides This exercise strengthens the muscles in front of your thigh and knee (quadriceps). 1. Lean your back against a smooth wall or door, and walk your feet out 18-24 inches (46-61 cm) from it. 2. Place your feet hip-width apart. 3. Slowly slide down the wall or door until your knees bend __________ degrees. Keep your knees over your heels, not over your toes. Keep your knees in line with your hips. 4. Hold this position for __________ seconds. Repeat __________ times. Complete this exercise __________ times a day. Straight leg raises This exercise strengthens the muscles that rotate the leg at the hip and move it away from your body (hip abductors). 1. Lie on your side with your left / right leg in the top position. Lie so your head, shoulder, knee, and hip line up. You may bend your bottom knee to help you keep your balance. 2. Roll your hips slightly forward so your hips are stacked directly over each other and your left / right knee is facing forward. 3. Leading with your heel, lift your top leg 4-6 inches (10-15 cm). You should feel the muscles in your outer hip lifting. ? Do not let your foot drift forward. ? Do not let  your knee roll toward the ceiling. 4. Hold this position for __________ seconds. 5. Slowly return your leg to the starting position. 6. Let your muscles relax completely after each repetition. Repeat __________ times. Complete this exercise __________ times a day. Straight leg raises This exercise stretches the muscles that move your hips away from the front of the pelvis (hip extensors). 1. Lie on your abdomen on a firm surface. You can put a pillow under your hips if that is more comfortable. 2. Tense the muscles in your buttocks and lift your left / right leg about 4-6 inches (10-15 cm). Keep your knee straight as you lift your leg. 3. Hold this position for __________ seconds. 4. Slowly lower your leg to the starting position. 5. Let your leg relax completely after each repetition. Repeat __________ times. Complete this exercise __________ times a day. This information is not intended to replace advice given to you by your health care provider. Make sure you discuss any questions you have with your health care provider. Document Revised: 06/18/2018 Document Reviewed: 06/18/2018 Elsevier Patient Education  2020 [employer].

## 2020-08-16 ENCOUNTER — Ambulatory Visit (INDEPENDENT_AMBULATORY_CARE_PROVIDER_SITE_OTHER): Payer: Medicare Other

## 2020-08-16 ENCOUNTER — Ambulatory Visit (INDEPENDENT_AMBULATORY_CARE_PROVIDER_SITE_OTHER): Payer: Medicare Other | Admitting: Orthopedic Surgery

## 2020-08-16 ENCOUNTER — Encounter: Payer: Self-pay | Admitting: Orthopedic Surgery

## 2020-08-16 DIAGNOSIS — M25572 Pain in left ankle and joints of left foot: Secondary | ICD-10-CM | POA: Diagnosis not present

## 2020-08-16 DIAGNOSIS — M2022 Hallux rigidus, left foot: Secondary | ICD-10-CM | POA: Diagnosis not present

## 2020-08-16 DIAGNOSIS — M7742 Metatarsalgia, left foot: Secondary | ICD-10-CM

## 2020-08-16 DIAGNOSIS — D1724 Benign lipomatous neoplasm of skin and subcutaneous tissue of left leg: Secondary | ICD-10-CM

## 2020-08-16 NOTE — Progress Notes (Signed)
Office Visit Note   Patient: Kimberly Reyes           Date of Birth: 24-Feb-1956           MRN: [id number] Visit Date: 08/16/2020              Requested by: Sharilyn Sites, [address] [place],  [place] [address] PCP: Sharilyn Sites, MD  Chief Complaint  Patient presents with  . Left Foot - Pain  . Left Ankle - Pain      HPI: Patient is a 64 year old woman who presents complaining of a soft tissue mass over the dorsal lateral aspect of the left foot and ankle pain at the MTP joint with ambulation as well as pain and callus beneath the second and third metatarsal heads.  She states she has had symptoms for 4 to 5 years she has tried wearing stiff soled shoes.  Assessment & Plan: Visit Diagnoses:  1. Pain in left ankle and joints of left foot   2. Hallux rigidus, left foot   3. Lipoma of left lower extremity   4. Metatarsalgia of left foot     Plan: Discussed that due to failure of conservative care surgical intervention is an option surgical intervention would include a Weil osteotomy for the second and third metatarsals fusion of the great toe MTP joint and excision of the lipoma dorsal lateral aspect of the left foot and ankle discussed that we could do this as an outpatient surgery risks and benefits were discussed including infection neurovascular injury persistent pain DVT need for additional surgery.  Patient states she understands and she will call us to set up surgery in January or February.  Follow-Up Instructions: Return if symptoms worsen or fail to improve, for Patient will call to set up surgery.Manson Passey Exam  Patient is alert, oriented, no adenopathy, well-dressed, normal affect, normal respiratory effort. Examination patient has a good dorsalis pedis and posterior tibial pulse she has a soft tissue lipoma over the dorsal lateral aspect of the foot and ankle on the left this is nontender to palpation the ankle joint is nontender to palpation anterior drawer  is stable.  Radiographs of the ankle are normal.  Examination of the right great toe she does have hallux valgus and hallux rigidus there is no open ulcers she has plantarflexion of 0 degrees dorsiflexion of 30 degrees she has pain with range of motion.  She does have a prominent second and third metatarsal heads with callus beneath the second and third metatarsal heads the callus was pared in the office a Band-Aid was applied.  Patient states she has autoimmune hepatitis and is currently on 5 mg of prednisone.  Imaging: XR Ankle Complete Left  Result Date: 08/16/2020 Three-view radiographs of the left ankle shows a congruent joint no bone spurs no joint space narrowing no cystic changes.  XR Foot Complete Left  Result Date: 08/16/2020 Three-view radiographs of the left foot shows a long second and third metatarsal there is a noncongruent MTP joint with destructive bony changes and subcondylar cysts in the metatarsal head.  No images are attached to the encounter.  Labs: Lab Results  Component Value Date   ESRSEDRATE 37 (H) 03/01/2017     Lab Results  Component Value Date   ALBUMIN 3.7 03/13/2017   ALBUMIN 3.7 03/06/2017   ALBUMIN 3.5 (L) 02/14/2017    No results found for: MG No results found for: VD25OH  No results found for: PREALBUMIN  CBC EXTENDED Latest Ref Rng & Units 07/20/2017 06/26/2017 02/07/2017  WBC 4.0 - 10.5 K/uL 8.4 8.3 7.8  RBC 3.87 - 5.11 MIL/uL 3.89 3.95 3.83(L)  HGB 12.0 - 15.0 g/dL 11.1(L) 11.0(L) 10.9(L)  HCT 36 - 46 % 34.7(L) 34.0(L) 33.3(L)  PLT 150 - 400 K/uL 193 210 200  NEUTROABS 1,500 - 7,800 cells/uL - 5,212 4.0  LYMPHSABS 850 - 3,900 cells/uL - 1,934 2.2     There is no height or weight on file to calculate BMI.  Orders:  Orders Placed This Encounter  Procedures  . XR Foot Complete Left  . XR Ankle Complete Left   No orders of the defined types were placed in this encounter.    Procedures: No procedures performed  Clinical Data: No  additional findings.  ROS:  All other systems negative, except as noted in the HPI. Review of Systems  Objective: Vital Signs: There were no vitals taken for this visit.  Specialty Comments:  No specialty comments available.  PMFS History: Patient Active Problem List   Diagnosis Date Noted  . Family hx of colon cancer 03/29/2018  . Elevated LFTs 08/06/2017  . Primary osteoarthritis of right hip 08/06/2017  . Primary osteoarthritis of both feet 08/06/2017  . Primary osteoarthritis of both hands 08/06/2017  . Myofascial pain 08/06/2017  . Hypothyroid 11/25/2014  . Hypothyroidism 11/25/2014   Past Medical History:  Diagnosis Date  . Anxiety   . Asthma   40 yrs ago   not current probem no inhaler use  . Headache(784.0)   . Hypothyroidism     Family History  Problem Relation Age of Onset  . Colon cancer Father   . CVA Mother   . Pneumonia Brother   . Cancer Sister        breast, thyroid   . Heart disease Sister   . Healthy Son     Past Surgical History:  Procedure Laterality Date  . ABDOMINAL HYSTERECTOMY     20 years ago, complete  . CHOLECYSTECTOMY N/A 11/03/2014   Procedure: LAPAROSCOPIC CHOLECYSTECTOMY WITH INTRAOPERATIVE CHOLANGIOGRAM;  Surgeon: Gayland Curry, MD;  Location: [facility];  Service: General;  Laterality: N/A;  . COLONOSCOPY N/A 03/27/2013   Procedure: COLONOSCOPY;  Surgeon: Rogene Houston, MD;  Location: [facility];  Service: Endoscopy;  Laterality: N/A;  1030  . COLONOSCOPY N/A 06/05/2018   Procedure: COLONOSCOPY;  Surgeon: Rogene Houston, MD;  Location: [facility];  Service: Endoscopy;  Laterality: N/A;  1025  . KNEE ARTHROSCOPY W/ MENISCAL REPAIR Right   . TUBAL LIGATION     Social History   Occupational History  . Not on file  Tobacco Use  . Smoking status: Never Smoker  . Smokeless tobacco: Never Used  Vaping Use  . Vaping Use: Never used  Substance and Sexual Activity  . Alcohol use: Not Currently  . Drug use: Never  . Sexual  activity: Not on file

## 2020-11-10 NOTE — Progress Notes (Signed)
Office Visit Note  Patient: Kimberly Reyes             Date of Birth: 1955/10/11           MRN: [id number]             PCP: Sharilyn Sites, MD Referring: Sharilyn Sites, MD Visit Date: 11/24/2020 Occupation: @GUAROCC @  Subjective:  Pain in knee joints.   History of Present Illness: Kimberly Reyes is a 65 y.o. female with history of osteoarthritis and autoimmune hepatitis.  She states she continues to have some pain and stiffness in her right knee joint.  She has been followed by orthopedic surgeon closely.  She had arthroscopic surgery without much relief.  She is also followed by Dr. Benson Norway and has been taking Imuran 50 mg p.o. daily and prednisone 5 mg p.o. daily.  She states she would not be able to come off prednisone.  She is trying weight loss which has been difficult for her.  She states that her hemoglobin A1c is elevated.  Activities of Daily Living:  Patient reports morning stiffness for 30 minutes.   Patient Reports nocturnal pain.  Difficulty dressing/grooming: Denies Difficulty climbing stairs: Reports Difficulty getting out of chair: Denies Difficulty using hands for taps, buttons, cutlery, and/or writing: Denies  Review of Systems  Constitutional: Negative for fatigue.  HENT: Negative for mouth sores, mouth dryness and nose dryness.   Eyes: Positive for dryness. Negative for pain and itching.  Respiratory: Negative for shortness of breath and difficulty breathing.   Cardiovascular: Negative for chest pain and palpitations.  Gastrointestinal: Positive for constipation. Negative for blood in stool and diarrhea.  Endocrine: Negative for increased urination.  Genitourinary: Negative for difficulty urinating.  Musculoskeletal: Positive for arthralgias, joint pain and morning stiffness. Negative for joint swelling, myalgias, muscle tenderness and myalgias.  Skin: Negative for color change, rash and redness.  Allergic/Immunologic: Negative for susceptible to infections.   Neurological: Positive for headaches. Negative for dizziness, numbness, memory loss and weakness.  Hematological: Positive for bruising/bleeding tendency.  Psychiatric/Behavioral: Negative for confusion.    PMFS History:  Patient Active Problem List   Diagnosis Date Noted  . Family hx of colon cancer 03/29/2018  . Elevated LFTs 08/06/2017  . Primary osteoarthritis of right hip 08/06/2017  . Primary osteoarthritis of both feet 08/06/2017  . Primary osteoarthritis of both hands 08/06/2017  . Myofascial pain 08/06/2017  . Hypothyroid 11/25/2014  . Hypothyroidism 11/25/2014    Past Medical History:  Diagnosis Date  . Anxiety   . Asthma   40 yrs ago   not current probem no inhaler use  . Headache(784.0)   . Hypothyroidism     Family History  Problem Relation Age of Onset  . Colon cancer Father   . CVA Mother   . Pneumonia Brother   . Cancer Sister        breast, thyroid   . Heart disease Sister   . Healthy Son    Past Surgical History:  Procedure Laterality Date  . ABDOMINAL HYSTERECTOMY     20 years ago, complete  . CHOLECYSTECTOMY N/A 11/03/2014   Procedure: LAPAROSCOPIC CHOLECYSTECTOMY WITH INTRAOPERATIVE CHOLANGIOGRAM;  Surgeon: Gayland Curry, MD;  Location: [facility];  Service: General;  Laterality: N/A;  . COLONOSCOPY N/A 03/27/2013   Procedure: COLONOSCOPY;  Surgeon: Rogene Houston, MD;  Location: [facility];  Service: Endoscopy;  Laterality: N/A;  1030  . COLONOSCOPY N/A 06/05/2018   Procedure: COLONOSCOPY;  Surgeon: Laural Golden,  Mechele Dawley, MD;  Location: [facility];  Service: Endoscopy;  Laterality: N/A;  1025  . KNEE ARTHROSCOPY W/ MENISCAL REPAIR Right   . TUBAL LIGATION     Social History   Social History Narrative  . Not on file   Immunization History  Administered Date(s) Administered  . Influenza,inj,Quad PF,6+ Mos 07/07/2018  . PFIZER(Purple Top)SARS-COV-2 Vaccination 10/23/2019, 11/20/2019, 05/06/2020     Objective: Vital Signs: BP 133/80 (BP  Location: Left Arm, Patient Position: Sitting, Cuff Size: Normal)   Pulse 73   Resp 14   Ht 5\' 8"  (1.727 m)   Wt 191 lb 3.2 oz (86.7 kg)   BMI 29.07 kg/m    Physical Exam Vitals and nursing note reviewed.  Constitutional:      Appearance: She is well-developed.  HENT:     Head: Normocephalic and atraumatic.  Eyes:     Conjunctiva/sclera: Conjunctivae normal.  Cardiovascular:     Rate and Rhythm: Normal rate and regular rhythm.     Heart sounds: Normal heart sounds.  Pulmonary:     Effort: Pulmonary effort is normal.     Breath sounds: Normal breath sounds.  Abdominal:     General: Bowel sounds are normal.     Palpations: Abdomen is soft.  Musculoskeletal:     Cervical back: Normal range of motion.  Lymphadenopathy:     Cervical: No cervical adenopathy.  Skin:    General: Skin is warm and dry.     Capillary Refill: Capillary refill takes less than 2 seconds.  Neurological:     Mental Status: She is alert and oriented to person, place, and time.  Psychiatric:        Behavior: Behavior normal.      Musculoskeletal Exam: C-spine was in good range of motion.  She has thoracic kyphosis.  Shoulder joints, elbow joints, wrist joints were in good range of motion.  She has DIP and PIP thickening.  Hip joints with good range of motion.  She has some warmth on palpation of her right knee joint.  Left knee joint was in good range of motion.  There was no tenderness over ankles or MTPs. CDAI Exam: CDAI Score: - Patient Global: -; Provider Global: - Swollen: -; Tender: - Joint Exam 11/24/2020   No joint exam has been documented for this visit   There is currently no information documented on the homunculus. Go to the Rheumatology activity and complete the homunculus joint exam.  Investigation: No additional findings.  Imaging: No results found.  Recent Labs: Lab Results  Component Value Date   WBC 8.4 07/20/2017   HGB 11.1 (L) 07/20/2017   PLT 193 07/20/2017   NA 138  06/26/2017   K 3.8 06/26/2017   CL 100 06/26/2017   CO2 31 06/26/2017   GLUCOSE 97 06/26/2017   BUN 13 06/26/2017   CREATININE 0.98 06/26/2017   BILITOT 0.4 06/26/2017   ALKPHOS 153 (H) 03/13/2017   AST 19 06/26/2017   ALT 108 (H) 06/26/2017   PROT 7.1 06/26/2017   PROT 7.1 06/26/2017   ALBUMIN 3.7 03/13/2017   CALCIUM 9.3 06/26/2017   GFRAA 71 06/26/2017   QFTBGOLD NEGATIVE 06/26/2017    Speciality Comments: No specialty comments available.  Procedures:  No procedures performed Allergies: Patient has no known allergies.   Assessment / Plan:     Visit Diagnoses: Myofascial pain-she continues to have generalized pain and discomfort.  Need for regular exercise and good sleep hygiene was discussed.  Primary  osteoarthritis of both hands-transportation muscle strengthening was discussed.  Primary osteoarthritis of right hip-she had fairly good range of motion in her hip joint.  Trochanteric bursitis of both hips-she had left trochanteric bursitis on examination today.  IT band stretches were discussed.  She has handout at home.  Primary osteoarthritis of both knees - She has been followed by Dr. Noemi Chapel. She states she will require right knee replacement eventually.  She continues to have some swelling in her right knee joint.  Primary osteoarthritis of both feet-proper fitting shoes were emphasized.  Other kyphosis of thoracic region-exercise for posture correction were demonstrated.  Autoimmune hepatitis ([place]) - she has been followed by Dr. Benson Norway. She is on Imuran and prednisone combination. She states she is unable to taper prednisone.   High risk medication use - Imuran 50 mg p.o. daily, prednisone 5 mg p.o. daily. Her labs are monitored by Dr. Benson Norway.  Her labs are followed by her gastroenterologist.  On prednisone therapy - She is on long-term prednisone 5 mg 1 tablet by mouth daily.    Osteopenia of multiple sites - DEXA on January 05, 2020 showed T score of -2.3, no  significant change in BMD when compared to the DEXA scan of 2019.  History of hypothyroidism  COVID-19 virus infection-she had infection in February 2022.  She states she could not get monoclonal antibody infusion as it was not available to her.  She has been fully vaccinated against COVID-19 and also received a booster.  A fourth full dose of COVID-19 vaccine 6 months after the third dose was discussed.  I also advised her to get a Shingrix vaccine.  BMI 29.07-weight loss diet and exercise was emphasized.  Handout was placed in the AVS.  Orders: No orders of the defined types were placed in this encounter.  No orders of the defined types were placed in this encounter.    Follow-Up Instructions: Return in about 1 year (around 11/24/2021) for Osteoarthritis.   Bo Merino, MD  Note - This record has been created using Editor, commissioning.  Chart creation errors have been sought, but may not always  have been located. Such creation errors do not reflect on  the standard of medical care.

## 2020-11-24 ENCOUNTER — Encounter: Payer: Self-pay | Admitting: Rheumatology

## 2020-11-24 ENCOUNTER — Ambulatory Visit (INDEPENDENT_AMBULATORY_CARE_PROVIDER_SITE_OTHER): Payer: Medicare Other | Admitting: Rheumatology

## 2020-11-24 ENCOUNTER — Other Ambulatory Visit: Payer: Self-pay

## 2020-11-24 VITALS — BP 133/80 | HR 73 | Resp 14 | Ht 68.0 in | Wt 191.2 lb

## 2020-11-24 DIAGNOSIS — M1611 Unilateral primary osteoarthritis, right hip: Secondary | ICD-10-CM | POA: Diagnosis not present

## 2020-11-24 DIAGNOSIS — M7061 Trochanteric bursitis, right hip: Secondary | ICD-10-CM | POA: Diagnosis not present

## 2020-11-24 DIAGNOSIS — M19042 Primary osteoarthritis, left hand: Secondary | ICD-10-CM

## 2020-11-24 DIAGNOSIS — M7918 Myalgia, other site: Secondary | ICD-10-CM

## 2020-11-24 DIAGNOSIS — U071 COVID-19: Secondary | ICD-10-CM

## 2020-11-24 DIAGNOSIS — M19041 Primary osteoarthritis, right hand: Secondary | ICD-10-CM | POA: Diagnosis not present

## 2020-11-24 DIAGNOSIS — Z7952 Long term (current) use of systemic steroids: Secondary | ICD-10-CM

## 2020-11-24 DIAGNOSIS — M19072 Primary osteoarthritis, left ankle and foot: Secondary | ICD-10-CM

## 2020-11-24 DIAGNOSIS — M7062 Trochanteric bursitis, left hip: Secondary | ICD-10-CM

## 2020-11-24 DIAGNOSIS — Z8639 Personal history of other endocrine, nutritional and metabolic disease: Secondary | ICD-10-CM

## 2020-11-24 DIAGNOSIS — M40294 Other kyphosis, thoracic region: Secondary | ICD-10-CM

## 2020-11-24 DIAGNOSIS — M19071 Primary osteoarthritis, right ankle and foot: Secondary | ICD-10-CM

## 2020-11-24 DIAGNOSIS — K754 Autoimmune hepatitis: Secondary | ICD-10-CM

## 2020-11-24 DIAGNOSIS — M8589 Other specified disorders of bone density and structure, multiple sites: Secondary | ICD-10-CM

## 2020-11-24 DIAGNOSIS — Z6829 Body mass index (BMI) 29.0-29.9, adult: Secondary | ICD-10-CM

## 2020-11-24 DIAGNOSIS — Z79899 Other long term (current) drug therapy: Secondary | ICD-10-CM

## 2020-11-24 DIAGNOSIS — M170 Bilateral primary osteoarthritis of knee: Secondary | ICD-10-CM

## 2020-11-24 NOTE — Patient Instructions (Signed)
Vaccines You are taking a medication(s) that can suppress your immune system.  The following immunizations are recommended: . Flu annually . Covid-19  . Pneumonia (Pneumovax 23 and Prevnar 13 spaced at least 1 year apart) . Shingrix (after age 67)  Please check with your PCP to make sure you are up to date.  Heart Disease Prevention   Your inflammatory disease increases your risk of heart disease which includes heart attack, stroke, atrial fibrillation (irregular heartbeats), high blood pressure, heart failure and atherosclerosis (plaque in the arteries).  It is important to reduce your risk by:   . Keep blood pressure, cholesterol, and blood sugar at healthy levels   . Smoking Cessation   . Maintain a healthy weight  o BMI 20-25   . Eat a healthy diet  o Plenty of fresh fruit, vegetables, and whole grains  o Limit saturated fats, foods high in sodium, and added sugars  o DASH and Mediterranean diet   . Increase physical activity  o Recommend moderate physically activity for 150 minutes per week/ 30 minutes a day for five days a week These can be broken up into three separate ten-minute sessions during the day.   . Reduce Stress  . Meditation, slow breathing exercises, yoga, coloring books  . Dental visits twice a year   

## 2020-12-27 ENCOUNTER — Ambulatory Visit (INDEPENDENT_AMBULATORY_CARE_PROVIDER_SITE_OTHER): Payer: Medicare Other | Admitting: Orthopedic Surgery

## 2020-12-27 DIAGNOSIS — M7742 Metatarsalgia, left foot: Secondary | ICD-10-CM | POA: Diagnosis not present

## 2020-12-27 DIAGNOSIS — D1724 Benign lipomatous neoplasm of skin and subcutaneous tissue of left leg: Secondary | ICD-10-CM

## 2020-12-27 DIAGNOSIS — M2022 Hallux rigidus, left foot: Secondary | ICD-10-CM

## 2020-12-28 ENCOUNTER — Encounter: Payer: Self-pay | Admitting: Orthopedic Surgery

## 2020-12-28 NOTE — Progress Notes (Signed)
Office Visit Note   Patient: Kimberly Reyes           Date of Birth: [birth year]           MRN: [id number] Visit Date: 12/27/2020              Requested by: Sharilyn Sites, [address] [place],  [place] [address] PCP: Sharilyn Sites, MD  Chief Complaint  Patient presents with  . Left Foot - Follow-up    To discuss surgery.       HPI: Patient is a 65 year old woman who presents for evaluation for her left foot.  Patient complains of pain beneath the metatarsal heads pain at the MTP joint of the left great toe with ambulation and pain from a lipoma over the sinus Tarsi.  Assessment & Plan: Visit Diagnoses:  1. Hallux rigidus, left foot   2. Lipoma of left lower extremity   3. Metatarsalgia of left foot     Plan: Patient states she would like to proceed with surgical intervention.  Discussed that surgical intervention would include a Weil osteotomy for the second and third metatarsals pain from a hallux rigidus would require a fusion of the great toe MTP joint and surgical excision of the lipoma was discussed.  Patient states she would like to proceed with surgical intervention we will set this up at her convenience as outpatient surgery.  Follow-Up Instructions: Return if symptoms worsen or fail to improve.   Ortho Exam  Patient is alert, oriented, no adenopathy, well-dressed, normal affect, normal respiratory effort. Examination patient has a good pulse she has dorsiflexion to neutral she has a prominent second and third metatarsal head with callus beneath the forefoot these areas are tender to palpation.  She has dorsiflexion of the great toe MTP joint only to 45 degrees and this is painful with range of motion she has pain to palpation of the bony spurs dorsally over the MTP joint.  Patient has a soft tissue lipoma of the sinus Tarsi that measures 5 cm in diameter this is painful to palpation patient complains of pain with shoe wear.  Imaging: No results found. No  images are attached to the encounter.  Labs: Lab Results  Component Value Date   ESRSEDRATE 37 (H) 03/01/2017     Lab Results  Component Value Date   ALBUMIN 3.7 03/13/2017   ALBUMIN 3.7 03/06/2017   ALBUMIN 3.5 (L) 02/14/2017    No results found for: MG No results found for: VD25OH  No results found for: PREALBUMIN CBC EXTENDED Latest Ref Rng & Units 07/20/2017 06/26/2017 02/07/2017  WBC 4.0 - 10.5 K/uL 8.4 8.3 7.8  RBC 3.87 - 5.11 MIL/uL 3.89 3.95 3.83(L)  HGB 12.0 - 15.0 g/dL 11.1(L) 11.0(L) 10.9(L)  HCT 36.0 - 46.0 % 34.7(L) 34.0(L) 33.3(L)  PLT 150 - 400 K/uL 193 210 200  NEUTROABS 1,500 - 7,800 cells/uL - 5,212 4.0  LYMPHSABS 850 - 3,900 cells/uL - 1,934 2.2     There is no height or weight on file to calculate BMI.  Orders:  No orders of the defined types were placed in this encounter.  No orders of the defined types were placed in this encounter.    Procedures: No procedures performed  Clinical Data: No additional findings.  ROS:  All other systems negative, except as noted in the HPI. Review of Systems  Objective: Vital Signs: There were no vitals taken for this visit.  Specialty Comments:  No specialty comments available.  PMFS History: Patient Active Problem List   Diagnosis Date Noted  . Family hx of colon cancer 03/29/2018  . Elevated LFTs 08/06/2017  . Primary osteoarthritis of right hip 08/06/2017  . Primary osteoarthritis of both feet 08/06/2017  . Primary osteoarthritis of both hands 08/06/2017  . Myofascial pain 08/06/2017  . Hypothyroid 11/25/2014  . Hypothyroidism 11/25/2014   Past Medical History:  Diagnosis Date  . Anxiety   . Asthma   40 yrs ago   not current probem no inhaler use  . Headache(784.0)   . Hypothyroidism     Family History  Problem Relation Age of Onset  . Colon cancer Father   . CVA Mother   . Pneumonia Brother   . Cancer Sister        breast, thyroid   . Heart disease Sister   . Healthy Son      Past Surgical History:  Procedure Laterality Date  . ABDOMINAL HYSTERECTOMY     20 years ago, complete  . CHOLECYSTECTOMY N/A 11/03/2014   Procedure: LAPAROSCOPIC CHOLECYSTECTOMY WITH INTRAOPERATIVE CHOLANGIOGRAM;  Surgeon: Gayland Curry, MD;  Location: [facility];  Service: General;  Laterality: N/A;  . COLONOSCOPY N/A 03/27/2013   Procedure: COLONOSCOPY;  Surgeon: Rogene Houston, MD;  Location: [facility];  Service: Endoscopy;  Laterality: N/A;  1030  . COLONOSCOPY N/A 06/05/2018   Procedure: COLONOSCOPY;  Surgeon: Rogene Houston, MD;  Location: [facility];  Service: Endoscopy;  Laterality: N/A;  1025  . KNEE ARTHROSCOPY W/ MENISCAL REPAIR Right   . TUBAL LIGATION     Social History   Occupational History  . Not on file  Tobacco Use  . Smoking status: Never Smoker  . Smokeless tobacco: Never Used  Vaping Use  . Vaping Use: Never used  Substance and Sexual Activity  . Alcohol use: Not Currently  . Drug use: Never  . Sexual activity: Not on file

## 2021-01-06 ENCOUNTER — Other Ambulatory Visit: Payer: Self-pay

## 2021-01-11 ENCOUNTER — Encounter (HOSPITAL_BASED_OUTPATIENT_CLINIC_OR_DEPARTMENT_OTHER): Payer: Self-pay | Admitting: Orthopedic Surgery

## 2021-01-11 ENCOUNTER — Other Ambulatory Visit: Payer: Self-pay

## 2021-01-11 ENCOUNTER — Telehealth: Payer: Self-pay | Admitting: Orthopedic Surgery

## 2021-01-11 NOTE — Telephone Encounter (Signed)
Patient called asked if the [employer] shoe would have the support she need so that she will not have to wear the wooden shoe? The number to contact patient is [phone]

## 2021-01-12 ENCOUNTER — Telehealth: Payer: Self-pay | Admitting: Orthopedic Surgery

## 2021-01-12 NOTE — Telephone Encounter (Signed)
Patient called requesting a call back about after surgery shoes she would like to purchase. Please call patient at [phone]

## 2021-01-12 NOTE — Telephone Encounter (Signed)
Duplicate message will sign off on this one and await response from first message.

## 2021-01-12 NOTE — Telephone Encounter (Signed)
She will need to wear the post op shoe until the wound is healed

## 2021-01-12 NOTE — Telephone Encounter (Signed)
Pt is s/p a haglund's deformity surgery. Please see below and advise.

## 2021-01-12 NOTE — Telephone Encounter (Signed)
I called and sw pt to advise voiced understanding and will call with any other questions.

## 2021-01-13 ENCOUNTER — Other Ambulatory Visit: Payer: Self-pay | Admitting: Physician Assistant

## 2021-01-14 ENCOUNTER — Other Ambulatory Visit (HOSPITAL_COMMUNITY)
Admission: RE | Admit: 2021-01-14 | Discharge: 2021-01-14 | Disposition: A | Discharge status: Home / Self Care | Payer: Medicare Other | Source: Ambulatory Visit

## 2021-01-14 ENCOUNTER — Encounter (HOSPITAL_BASED_OUTPATIENT_CLINIC_OR_DEPARTMENT_OTHER)
Admission: RE | Admit: 2021-01-14 | Discharge: 2021-01-14 | Disposition: A | Discharge status: Home / Self Care | Payer: Medicare Other | Source: Ambulatory Visit | Attending: Orthopedic Surgery | Admitting: Orthopedic Surgery

## 2021-01-14 DIAGNOSIS — Z20822 Contact with and (suspected) exposure to covid-19: Secondary | ICD-10-CM | POA: Insufficient documentation

## 2021-01-14 DIAGNOSIS — Z01812 Encounter for preprocedural laboratory examination: Secondary | ICD-10-CM | POA: Insufficient documentation

## 2021-01-14 LAB — COMPREHENSIVE METABOLIC PANEL
ALT: 21 U/L (ref 0–44)
AST: 32 U/L (ref 15–41)
Albumin: 3.8 g/dL (ref 3.5–5.0)
Alkaline Phosphatase: 95 U/L (ref 38–126)
Anion gap: 8 (ref 5–15)
BUN: 18 mg/dL (ref 8–23)
CO2: 26 mmol/L (ref 22–32)
Calcium: 9.3 mg/dL (ref 8.9–10.3)
Chloride: 103 mmol/L (ref 98–111)
Creatinine, Ser: 0.95 mg/dL (ref 0.44–1.00)
GFR, Estimated: >60 mL/min (ref >60)
Glucose, Bld: 115 mg/dL — ABNORMAL HIGH (ref 70–99)
Potassium: 4.5 mmol/L (ref 3.5–5.1)
Sodium: 137 mmol/L (ref 135–145)
Total Bilirubin: 0.7 mg/dL (ref 0.3–1.2)
Total Protein: 6.5 g/dL (ref 6.5–8.1)

## 2021-01-14 LAB — SARS CORONAVIRUS 2 (TAT 6-24 HRS): SARS Coronavirus 2: NEGATIVE

## 2021-01-18 ENCOUNTER — Other Ambulatory Visit: Payer: Self-pay

## 2021-01-18 ENCOUNTER — Ambulatory Visit (HOSPITAL_BASED_OUTPATIENT_CLINIC_OR_DEPARTMENT_OTHER)
Admission: RE | Admit: 2021-01-18 | Discharge: 2021-01-18 | Disposition: A | Discharge status: Home / Self Care | Payer: Medicare Other | Attending: Orthopedic Surgery | Admitting: Orthopedic Surgery

## 2021-01-18 ENCOUNTER — Encounter (HOSPITAL_BASED_OUTPATIENT_CLINIC_OR_DEPARTMENT_OTHER)
Admission: RE | Disposition: A | Discharge status: Home / Self Care | Payer: Self-pay | Source: Home / Self Care | Attending: Orthopedic Surgery

## 2021-01-18 ENCOUNTER — Ambulatory Visit (HOSPITAL_BASED_OUTPATIENT_CLINIC_OR_DEPARTMENT_OTHER): Payer: Medicare Other | Admitting: Anesthesiology

## 2021-01-18 ENCOUNTER — Telehealth: Payer: Self-pay

## 2021-01-18 ENCOUNTER — Encounter (HOSPITAL_BASED_OUTPATIENT_CLINIC_OR_DEPARTMENT_OTHER): Payer: Self-pay | Admitting: Orthopedic Surgery

## 2021-01-18 DIAGNOSIS — M2022 Hallux rigidus, left foot: Secondary | ICD-10-CM

## 2021-01-18 DIAGNOSIS — M7742 Metatarsalgia, left foot: Secondary | ICD-10-CM | POA: Diagnosis not present

## 2021-01-18 DIAGNOSIS — Z7952 Long term (current) use of systemic steroids: Secondary | ICD-10-CM | POA: Diagnosis not present

## 2021-01-18 DIAGNOSIS — R2242 Localized swelling, mass and lump, left lower limb: Secondary | ICD-10-CM

## 2021-01-18 DIAGNOSIS — Z79899 Other long term (current) drug therapy: Secondary | ICD-10-CM | POA: Diagnosis not present

## 2021-01-18 DIAGNOSIS — M205X2 Other deformities of toe(s) (acquired), left foot: Secondary | ICD-10-CM | POA: Diagnosis present

## 2021-01-18 DIAGNOSIS — Z79890 Hormone replacement therapy (postmenopausal): Secondary | ICD-10-CM | POA: Diagnosis not present

## 2021-01-18 DIAGNOSIS — D1724 Benign lipomatous neoplasm of skin and subcutaneous tissue of left leg: Secondary | ICD-10-CM | POA: Diagnosis not present

## 2021-01-18 HISTORY — PX: WEIL OSTEOTOMY: SHX5044

## 2021-01-18 HISTORY — DX: Unspecified osteoarthritis, unspecified site: M19.90

## 2021-01-18 HISTORY — PX: ARTHRODESIS METATARSALPHALANGEAL JOINT (MTPJ): SHX6566

## 2021-01-18 HISTORY — DX: Autoimmune hepatitis: K75.4

## 2021-01-18 SURGERY — OSTEOTOMY, WEIL
Anesthesia: General | Site: Foot | Laterality: Left

## 2021-01-18 MED ORDER — DEXAMETHASONE SODIUM PHOSPHATE 10 MG/ML IJ SOLN
INTRAMUSCULAR | Status: AC
  Filled 2021-01-18: qty 2

## 2021-01-18 MED ORDER — LIDOCAINE 2% (20 MG/ML) 5 ML SYRINGE
INTRAMUSCULAR | Status: DC | PRN
  Administered 2021-01-18: 60 mg via INTRAVENOUS

## 2021-01-18 MED ORDER — ROPIVACAINE HCL 5 MG/ML IJ SOLN
INTRAMUSCULAR | Status: DC | PRN
  Administered 2021-01-18: 30 mL via PERINEURAL

## 2021-01-18 MED ORDER — DEXAMETHASONE SODIUM PHOSPHATE 4 MG/ML IJ SOLN
INTRAMUSCULAR | Status: DC | PRN
  Administered 2021-01-18: 10 mg via INTRAVENOUS

## 2021-01-18 MED ORDER — AMISULPRIDE (ANTIEMETIC) 5 MG/2ML IV SOLN: 10.0000 mg | Freq: Once | INTRAVENOUS | Status: DC | PRN

## 2021-01-18 MED ORDER — LACTATED RINGERS IV SOLN: INTRAVENOUS | Status: DC

## 2021-01-18 MED ORDER — PHENYLEPHRINE HCL (PRESSORS) 10 MG/ML IV SOLN
INTRAVENOUS | Status: DC | PRN
  Administered 2021-01-18: 120 ug via INTRAVENOUS

## 2021-01-18 MED ORDER — MIDAZOLAM HCL 2 MG/2ML IJ SOLN
INTRAMUSCULAR | Status: AC
  Filled 2021-01-18: qty 2

## 2021-01-18 MED ORDER — FENTANYL CITRATE (PF) 100 MCG/2ML IJ SOLN
INTRAMUSCULAR | Status: AC
  Filled 2021-01-18: qty 2

## 2021-01-18 MED ORDER — HYDROCODONE-ACETAMINOPHEN 5-325 MG PO TABS: 1.0000 | ORAL_TABLET | ORAL | 0 refills | Status: DC | PRN

## 2021-01-18 MED ORDER — MIDAZOLAM HCL 2 MG/2ML IJ SOLN
2.0000 mg | Freq: Once | INTRAMUSCULAR | Status: AC
  Administered 2021-01-18: 2 mg via INTRAVENOUS

## 2021-01-18 MED ORDER — HYDROMORPHONE HCL 1 MG/ML IJ SOLN: 0.2500 mg | INTRAMUSCULAR | Status: DC | PRN

## 2021-01-18 MED ORDER — CEFAZOLIN SODIUM-DEXTROSE 2-4 GM/100ML-% IV SOLN
INTRAVENOUS | Status: AC
  Filled 2021-01-18: qty 100

## 2021-01-18 MED ORDER — ONDANSETRON HCL 4 MG/2ML IJ SOLN
INTRAMUSCULAR | Status: DC | PRN
  Administered 2021-01-18: 4 mg via INTRAVENOUS

## 2021-01-18 MED ORDER — PROPOFOL 10 MG/ML IV BOLUS
INTRAVENOUS | Status: DC | PRN
  Administered 2021-01-18: 200 mg via INTRAVENOUS

## 2021-01-18 MED ORDER — PROPOFOL 10 MG/ML IV BOLUS
INTRAVENOUS | Status: AC
  Filled 2021-01-18: qty 20

## 2021-01-18 MED ORDER — MEPERIDINE HCL 25 MG/ML IJ SOLN: 6.2500 mg | INTRAMUSCULAR | Status: DC | PRN

## 2021-01-18 MED ORDER — LIDOCAINE 2% (20 MG/ML) 5 ML SYRINGE
INTRAMUSCULAR | Status: AC
  Filled 2021-01-18: qty 5

## 2021-01-18 MED ORDER — FENTANYL CITRATE (PF) 100 MCG/2ML IJ SOLN
100.0000 ug | Freq: Once | INTRAMUSCULAR | Status: AC
  Administered 2021-01-18: 100 ug via INTRAVENOUS

## 2021-01-18 MED ORDER — ONDANSETRON HCL 4 MG/2ML IJ SOLN
INTRAMUSCULAR | Status: AC
  Filled 2021-01-18: qty 2

## 2021-01-18 MED ORDER — PHENYLEPHRINE 40 MCG/ML (10ML) SYRINGE FOR IV PUSH (FOR BLOOD PRESSURE SUPPORT)
PREFILLED_SYRINGE | INTRAVENOUS | Status: AC
  Filled 2021-01-18: qty 10

## 2021-01-18 MED ORDER — PROMETHAZINE HCL 25 MG/ML IJ SOLN: 6.2500 mg | INTRAMUSCULAR | Status: DC | PRN

## 2021-01-18 MED ORDER — OXYCODONE HCL 5 MG/5ML PO SOLN: 5.0000 mg | Freq: Once | ORAL | Status: DC | PRN

## 2021-01-18 MED ORDER — CEFAZOLIN SODIUM-DEXTROSE 2-4 GM/100ML-% IV SOLN
2.0000 g | INTRAVENOUS | Status: AC
  Administered 2021-01-18: 2 g via INTRAVENOUS

## 2021-01-18 MED ORDER — OXYCODONE HCL 5 MG PO TABS: 5.0000 mg | ORAL_TABLET | Freq: Once | ORAL | Status: DC | PRN

## 2021-01-18 SURGICAL SUPPLY — 56 items
BIT DRILL 2.4X140 LONG SOLID (BIT) ×1 IMPLANT
BIT DRILL PROS QC 1.5 (BIT) ×1 IMPLANT
BLADE OSC/SAG .038X5.5 CUT EDG (BLADE) ×4 IMPLANT
BLADE SURG 15 STRL LF DISP TIS (BLADE) ×2 IMPLANT
BLADE SURG 15 STRL SS (BLADE) ×4
BNDG CMPR 9X4 STRL LF SNTH (GAUZE/BANDAGES/DRESSINGS) ×1
BNDG COHESIVE 4X5 TAN STRL (GAUZE/BANDAGES/DRESSINGS) ×2 IMPLANT
BNDG ESMARK 4X9 LF (GAUZE/BANDAGES/DRESSINGS) ×2 IMPLANT
BNDG GAUZE ELAST 4 BULKY (GAUZE/BANDAGES/DRESSINGS) ×2 IMPLANT
COVER BACK TABLE 60X90IN (DRAPES) ×2 IMPLANT
COVER WAND RF STERILE (DRAPES) IMPLANT
DECANTER SPIKE VIAL GLASS SM (MISCELLANEOUS) IMPLANT
DRAPE EXTREMITY T 121X128X90 (DISPOSABLE) ×2 IMPLANT
DRAPE IMP U-DRAPE 54X76 (DRAPES) ×2 IMPLANT
DRAPE OEC MINIVIEW 54X84 (DRAPES) ×2 IMPLANT
DRAPE U-SHAPE 47X51 STRL (DRAPES) ×2 IMPLANT
DRSG EMULSION OIL 3X3 NADH (GAUZE/BANDAGES/DRESSINGS) ×4 IMPLANT
DRSG PAD ABDOMINAL 8X10 ST (GAUZE/BANDAGES/DRESSINGS) ×2 IMPLANT
DURAPREP 26ML APPLICATOR (WOUND CARE) ×2 IMPLANT
ELECT REM PT RETURN 9FT ADLT (ELECTROSURGICAL) ×2
ELECTRODE REM PT RTRN 9FT ADLT (ELECTROSURGICAL) ×1 IMPLANT
GAUZE 4X4 16PLY RFD (DISPOSABLE) IMPLANT
GAUZE SPONGE 4X4 12PLY STRL (GAUZE/BANDAGES/DRESSINGS) ×2 IMPLANT
GLOVE SURG ENC MOIS LTX SZ6.5 (GLOVE) ×1 IMPLANT
GLOVE SURG ORTHO LTX SZ9 (GLOVE) ×3 IMPLANT
GLOVE SURG UNDER POLY LF SZ7 (GLOVE) ×1 IMPLANT
GLOVE SURG UNDER POLY LF SZ9 (GLOVE) ×2 IMPLANT
GOWN STRL REUS W/ TWL LRG LVL3 (GOWN DISPOSABLE) ×1 IMPLANT
GOWN STRL REUS W/TWL LRG LVL3 (GOWN DISPOSABLE) ×2
GOWN STRL REUS W/TWL XL LVL3 (GOWN DISPOSABLE) ×2 IMPLANT
K-WIRE SMOOTH 2.3X150 SGL TIP (WIRE) ×4
KWIRE SMOOTH 2.3X150 SGL TIP (WIRE) IMPLANT
NDL HYPO 25X1 1.5 SAFETY (NEEDLE) IMPLANT
NEEDLE HYPO 25X1 1.5 SAFETY (NEEDLE) IMPLANT
NS IRRIG 1000ML POUR BTL (IV SOLUTION) ×2 IMPLANT
PACK BASIN DAY SURGERY FS (CUSTOM PROCEDURE TRAY) ×2 IMPLANT
PAD CAST 4YDX4 CTTN HI CHSV (CAST SUPPLIES) ×1 IMPLANT
PADDING CAST ABS 4INX4YD NS (CAST SUPPLIES) ×1
PADDING CAST ABS COTTON 4X4 ST (CAST SUPPLIES) ×1 IMPLANT
PADDING CAST COTTON 4X4 STRL (CAST SUPPLIES) ×2
PENCIL SMOKE EVACUATOR (MISCELLANEOUS) ×2 IMPLANT
PLATE MTP LEFT SMALL 5 DEGREE (Plate) ×1 IMPLANT
SCREW CORTEX ST 2.0X12 (Screw) ×2 IMPLANT
SCREW LOCK 3 3.5X18 (Screw) ×2 IMPLANT
SCREW LOCK PLATE R3 3.5X16 (Screw) ×2 IMPLANT
SCREW NON LOCK 3.5X18 (Screw) ×1 IMPLANT
SPONGE LAP 18X18 RF (DISPOSABLE) ×2 IMPLANT
STOCKINETTE 6  STRL (DRAPES) ×2
STOCKINETTE 6 STRL (DRAPES) ×1 IMPLANT
SUT ETHILON 2 0 FSLX (SUTURE) ×3 IMPLANT
SUT ETHILON 3 0 FSL (SUTURE) IMPLANT
SUT VIC AB 2-0 CT1 27 (SUTURE) ×2
SUT VIC AB 2-0 CT1 TAPERPNT 27 (SUTURE) ×1 IMPLANT
SYR BULB EAR ULCER 3OZ GRN STR (SYRINGE) ×2 IMPLANT
SYR CONTROL 10ML LL (SYRINGE) IMPLANT
TOWEL GREEN STERILE FF (TOWEL DISPOSABLE) ×4 IMPLANT

## 2021-01-18 NOTE — Op Note (Signed)
01/18/2021  9:14 AM  PATIENT:  Kimberly Reyes    PRE-OPERATIVE DIAGNOSIS:  Claw Toe, Hallux Rigidus, and Lipoma Left Foot  POST-OPERATIVE DIAGNOSIS:  Same  PROCEDURE:  WEIL OSTEOTOMY LEFT 2ND AND 3RD METATARSAL, LEFT FOOT FIRST METATARSAL PHALANGEAL ARTHRODESIS AND EXCISION OF LEFT ANKLE LIPOMA  SURGEON:  Newt Minion, MD  PHYSICIAN ASSISTANT:None ANESTHESIA:   General  PREOPERATIVE INDICATIONS:  Kimberly Reyes is a  65 y.o. female with a diagnosis of Claw Toe, Hallux Rigidus, and Lipoma Left Foot who failed conservative measures and elected for surgical management.    The risks benefits and alternatives were discussed with the patient preoperatively including but not limited to the risks of infection, bleeding, nerve injury, cardiopulmonary complications, the need for revision surgery, among others, and the patient was willing to proceed.  OPERATIVE IMPLANTS: Paragon 5 degree MTP fusion plate for the fusion and 2 x 12 mm mini frag screws for the Weil osteotomies.  @ENCIMAGES @  OPERATIVE FINDINGS: C-arm fluoroscopy verified alignment of the MTP fusion.  OPERATIVE PROCEDURE: Patient was brought to the operating room after undergoing a regional block she then underwent a general anesthetic.  After adequate levels anesthesia were obtained patient's left lower extremity was prepped using DuraPrep draped into a sterile field a timeout was called.  A medial longitudinal incision was made over the MTP joint this was carried down to the MTP joint.  Bony spurs were resected with a saw.  A guidewire was inserted down the shaft of the metatarsal head the 19 mm cup reamer was used to debride back to bleeding viable subchondral bone remainder of the bone spurs were removed with a saw.  A guidewire was then inserted down the shaft of the proximal phalanx and the 19 mm cone reamer was used to debride back to bleeding viable subchondral bone.  The wound was irrigated with normal saline all loose  fragments were removed.  The joint was reduced and the fusion plate was placed dorsally.  A screw was placed first proximally in the elongation slot a distal locking screw was placed the joint was compressed aligned and the proximal screw was compressed.  3 locking screws were placed proximally 2 locking screws placed distally.  C-arm possibly verified alignment.  The wound was irrigated with normal saline and retinaculum was closed using 2-0 Vicryl.  The skin was closed using 2-0 nylon.  A dorsal incision was made in the second webspace between the second and third metatarsals.  Hohmann retractors were used to protect the soft tissue at the MTP joint the second toe and a double stacked sawblade was used to perform a Weil osteotomy the overhanging bone was resected the metatarsal head was advanced proximally and stabilized with a 2 by millimeter frag screw.  The wound was irrigated normal saline.  The Hohmann retractors were then used to protect the soft tissue of the third metatarsal head.  Double stacked sawblade's were used to perform a Weil osteotomy overhanging bone was resected the metatarsal head was translated proximally and this was stabilized with a 2 x 12 mm mini frag screw.  Wound was irrigated normal saline incision was closed using 2-0 nylon.  A oblique incision in line with the skin folds was made over the sinus Tarsi.  There is a large lipoma.  This was resected in 1 block of tissue.  This was approximately 7 cm in diameter.  This was all soft tissue adipose tissue no signs of any malignancy.  The wound  was irrigated normal saline electrocardio was used hemostasis incision was closed using 2-0 nylon.  The Weil osteotomy incision was also closed using 2-0 nylon.  Sterile dressing was applied patient was extubated taken the PACU in stable condition.   DISCHARGE PLANNING:  Antibiotic duration: Preoperative antibiotics  Weightbearing: TDWB left   Pain medication: Vicodin  Dressing care/ Wound  VAC:dry dressing   Ambulatory devices:scooter, rutches  Discharge to: home  Follow-up: In the office 1 week post operative.

## 2021-01-18 NOTE — Discharge Instructions (Signed)

## 2021-01-18 NOTE — Anesthesia Preprocedure Evaluation (Signed)
Anesthesia Evaluation  Patient identified by MRN, date of birth, ID band Patient awake    Reviewed: Allergy & Precautions, H&P , NPO status , Patient's Chart, lab work & pertinent test results  Airway Mallampati: III  TM Distance: >3 FB Neck ROM: Full    Dental no notable dental hx. (+) Teeth Intact, Dental Advisory Given   Pulmonary asthma ,    Pulmonary exam normal breath sounds clear to auscultation       Cardiovascular negative cardio ROS   Rhythm:Regular Rate:Normal     Neuro/Psych  Headaches, Anxiety negative psych ROS   GI/Hepatic negative GI ROS, Neg liver ROS,   Endo/Other  Hypothyroidism   Renal/GU negative Renal ROS  negative genitourinary   Musculoskeletal  (+) Arthritis , Osteoarthritis,    Abdominal   Peds  Hematology negative hematology ROS (+)   Anesthesia Other Findings   Reproductive/Obstetrics negative OB ROS                             Anesthesia Physical  Anesthesia Plan  ASA: II  Anesthesia Plan: General   Post-op Pain Management:  Regional for Post-op pain   Induction: Intravenous  PONV Risk Score and Plan: 3 and Ondansetron, Dexamethasone, Midazolam and Treatment may vary due to age or medical condition  Airway Management Planned: LMA  Additional Equipment:   Intra-op Plan:   Post-operative Plan: Extubation in OR  Informed Consent: I have reviewed the patients History and Physical, chart, labs and discussed the procedure including the risks, benefits and alternatives for the proposed anesthesia with the patient or authorized representative who has indicated his/her understanding and acceptance.     Dental advisory given  Plan Discussed with: CRNA  Anesthesia Plan Comments:         Anesthesia Quick Evaluation

## 2021-01-18 NOTE — Progress Notes (Signed)
Assisted Dr. Miller with left, ultrasound guided, popliteal block. Side rails up, monitors on throughout procedure. See vital signs in flow sheet. Tolerated Procedure well. 

## 2021-01-18 NOTE — Transfer of Care (Signed)
Immediate Anesthesia Transfer of Care Note  Patient: Kimberly Reyes  Procedure(s) Performed: WEIL OSTEOTOMY LEFT 2ND AND 3RD METATARSAL (Left Foot) LEFT FOOT FIRST METATARSAL PHALANGEAL ARTHRODESIS AND EXCISION OF LEFT ANKLE LIPOMA (Left Foot)  Patient Location: PACU  Anesthesia Type:General  Level of Consciousness: awake, alert  and oriented  Airway & Oxygen Therapy: Patient Spontanous Breathing and Patient connected to face mask oxygen  Post-op Assessment: Report given to RN and Post -op Vital signs reviewed and stable  Post vital signs: Reviewed and stable  Last Vitals:  Vitals Value Taken Time  BP 129/77 01/18/21 0904  Temp    Pulse 77 01/18/21 0905  Resp 12 01/18/21 0905  SpO2 98 % 01/18/21 0905  Vitals shown include unvalidated device data.  Last Pain:  Vitals:   01/18/21 0643  TempSrc: Oral  PainSc: 0-No pain         Complications: No complications documented.

## 2021-01-18 NOTE — Anesthesia Postprocedure Evaluation (Signed)
Anesthesia Post Note  Patient: Kimberly Reyes  Procedure(s) Performed: WEIL OSTEOTOMY LEFT 2ND AND 3RD METATARSAL (Left Foot) LEFT FOOT FIRST METATARSAL PHALANGEAL ARTHRODESIS AND EXCISION OF LEFT ANKLE LIPOMA (Left Foot)     Patient location during evaluation: PACU Anesthesia Type: General Level of consciousness: awake and alert Pain management: pain level controlled Vital Signs Assessment: post-procedure vital signs reviewed and stable Respiratory status: spontaneous breathing, nonlabored ventilation and respiratory function stable Cardiovascular status: blood pressure returned to baseline and stable Postop Assessment: no apparent nausea or vomiting Anesthetic complications: no   No complications documented.  Last Vitals:  Vitals:   01/18/21 0915 01/18/21 0941  BP: 127/71 140/77  Pulse: 75 76  Resp: 11 12  Temp:  36.4 C  SpO2: 96% 96%    Last Pain:  Vitals:   01/18/21 0941  TempSrc:   PainSc: 0-No pain                 Lynda Rainwater

## 2021-01-18 NOTE — Anesthesia Procedure Notes (Signed)
Anesthesia Regional Block: Popliteal block   Pre-Anesthetic Checklist: ,, timeout performed, Correct Patient, Correct Site, Correct Laterality, Correct Procedure, Correct Position, site marked, Risks and benefits discussed,  Surgical consent,  Pre-op evaluation,  At surgeon's request and post-op pain management  Laterality: Left  Prep: chloraprep       Needles:  Injection technique: Single-shot  Needle Type: Stimiplex     Needle Length: 9cm  Needle Gauge: 21     Additional Needles:   Procedures:,,,, ultrasound used (permanent image in chart),,,,  Narrative:  Start time: 01/18/2021 7:04 AM End time: 01/18/2021 7:09 AM Injection made incrementally with aspirations every 5 mL.  Performed by: Personally  Anesthesiologist: Lynda Rainwater, MD

## 2021-01-18 NOTE — Interval H&P Note (Signed)
History and Physical Interval Note:  01/18/2021 7:32 AM  Kimberly Reyes  has presented today for surgery, with the diagnosis of Claw Toe, Hallux Rigidus, and Lipoma Left Foot.  The various methods of treatment have been discussed with the patient and family. After consideration of risks, benefits and other options for treatment, the patient has consented to  Procedure(s): WEIL OSTEOTOMY LEFT 2ND AND 3RD METATARSAL (Left) FUSION LEFT GREAT TOE METATARSALPHALANGEAL JOINT (MTPJ) AND EXCISION LIPOMA (Left) as a surgical intervention.  The patient's history has been reviewed, patient examined, no change in status, stable for surgery.  I have reviewed the patient's chart and labs.  Questions were answered to the patient's satisfaction.     Newt Minion

## 2021-01-18 NOTE — Telephone Encounter (Signed)
Patient would like to know if she can take a shower?  Patient had left foot surgery today.  Cb# [phone].  Please advise.  Thank you.

## 2021-01-18 NOTE — H&P (Signed)
Kimberly Reyes is an 65 y.o. female.   Chief Complaint: Left foot lipoma hallux rigidus metatarsalgia HPI: Patient is a 65 year old woman who presents for evaluation for her left foot.  Patient complains of pain beneath the metatarsal heads pain at the MTP joint of the left great toe with ambulation and pain from a lipoma over the sinus Tarsi.  Past Medical History:  Diagnosis Date  . Anxiety   . Arthritis   . Asthma   40 yrs ago   not current probem no inhaler use  . Autoimmune hepatitis ([place])   . Headache(784.0)   . Hypothyroidism     Past Surgical History:  Procedure Laterality Date  . ABDOMINAL HYSTERECTOMY     20 years ago, complete  . CHOLECYSTECTOMY N/A 11/03/2014   Procedure: LAPAROSCOPIC CHOLECYSTECTOMY WITH INTRAOPERATIVE CHOLANGIOGRAM;  Surgeon: Gayland Curry, MD;  [facility];  Service: General;  Laterality: N/A;  . COLONOSCOPY N/A 03/27/2013   Procedure: COLONOSCOPY;  Surgeon: Rogene Houston, MD;  [facility];  Service: Endoscopy;  Laterality: N/A;  1030  . COLONOSCOPY N/A 06/05/2018   Procedure: COLONOSCOPY;  Surgeon: Rogene Houston, MD;  [facility];  Service: Endoscopy;  Laterality: N/A;  1025  . KNEE ARTHROSCOPY W/ MENISCAL REPAIR Right   . TUBAL LIGATION      Family History  Problem Relation Age of Onset  . Colon cancer Father   . CVA Mother   . Pneumonia Brother   . Cancer Sister        breast, thyroid   . Heart disease Sister   . Healthy Son    Social History:  reports that she has never smoked. She has never used smokeless tobacco. She reports previous alcohol use. She reports that she does not use drugs.  Allergies: No Known Allergies  Medications Prior to Admission  Medication Sig Dispense Refill  . APPLE CIDER VINEGAR PO Take by mouth daily.    . Ascorbic Acid (VITAMIN C PO) Take by mouth daily.    Marland Kitchen azaTHIOprine (IMURAN) 50 MG tablet Take 50 mg by mouth at bedtime.     Marland Kitchen BIOTIN PO Take 5,000 mcg by mouth daily.     . Calcium-Magnesium-Vitamin D (CALCIUM 1200+D3 PO) Take 1 tablet by mouth at bedtime.    . Cinnamon 500 MG TABS Take by mouth.    . Cyanocobalamin (VITAMIN B-12) 5000 MCG SUBL Place 5,000 mcg under the tongue at bedtime.     Marland Kitchen FIBER PO Take by mouth daily.    . Glucosamine HCl (GLUCOSAMINE PO) Take by mouth daily.    Marland Kitchen levothyroxine (SYNTHROID, LEVOTHROID) 88 MCG tablet Take 88 mcg by mouth daily at 2 am.     . Multiple Vitamin (MULTIVITAMIN) tablet Take 1 tablet by mouth every evening.     Marland Kitchen omega-3 acid ethyl esters (LOVAZA) 1 g capsule Take by mouth 2 (two) times daily.    . predniSONE (DELTASONE) 10 MG tablet Take 5 mg by mouth daily after breakfast.     . thiamine 100 MG tablet Take 100 mg by mouth daily.    . Turmeric Curcumin 500 MG CAPS Take 500 mg by mouth every evening.    . zolpidem (AMBIEN) 10 MG tablet Take 10 mg at bedtime as needed by mouth for sleep.     Marland Kitchen eletriptan (RELPAX) 40 MG tablet Take 40 mg by mouth every 2 (two) hours as needed for migraine. One tablet by mouth at onset of  headache. May repeat in 2 hours if headache persists or recurs.      No results found for this or any previous visit (from the past 48 hour(s)). No results found.  Review of Systems  All other systems reviewed and are negative.   Blood pressure 130/76, pulse 72, temperature 98.9 F (37.2 C), temperature source Oral, resp. rate 16, height 5\' 8"  (1.727 m), weight 87.8 kg, SpO2 98 %. Physical Exam  Patient is alert, oriented, no adenopathy, well-dressed, normal affect, normal respiratory effort. Examination patient has a good pulse she has dorsiflexion to neutral she has a prominent second and third metatarsal head with callus beneath the forefoot these areas are tender to palpation.  She has dorsiflexion of the great toe MTP joint only to 45 degrees and this is painful with range of motion she has pain to palpation of the bony spurs dorsally over the MTP joint.  Patient has a soft tissue lipoma  of the sinus Tarsi that measures 5 cm in diameter this is painful to palpation patient complains of pain with shoe wear.Heart RRR Lungs clear Assessment/Plan Visit Diagnoses:  1. Hallux rigidus, left foot   2. Lipoma of left lower extremity   3. Metatarsalgia of left foot     Plan: Patient states she would like to proceed with surgical intervention.  Discussed that surgical intervention would include a Weil osteotomy for the second and third metatarsals pain from a hallux rigidus would require a fusion of the great toe MTP joint and surgical excision of the lipoma was discussed.  Patient states she would like to proceed with surgical intervention we will set this up at her convenience as outpatient surgery.   Bevely Palmer Garmon Dehn, PA 01/18/2021, 6:58 AM

## 2021-01-18 NOTE — Anesthesia Procedure Notes (Signed)
Procedure Name: LMA Insertion Performed by: Ezequiel Kayser, CRNA Pre-anesthesia Checklist: Patient identified, Emergency Drugs available, Suction available and Patient being monitored Patient Re-evaluated:Patient Re-evaluated prior to induction Oxygen Delivery Method: Circle System Utilized Preoxygenation: Pre-oxygenation with 100% oxygen Induction Type: IV induction Ventilation: Mask ventilation without difficulty LMA: LMA inserted LMA Size: 4.0 Number of attempts: 1 Airway Equipment and Method: Bite block Placement Confirmation: positive ETCO2 Tube secured with: Tape Dental Injury: Teeth and Oropharynx as per pre-operative assessment

## 2021-01-18 NOTE — Telephone Encounter (Signed)
Called pt and advised that she should not get her dressing wet so any way that she can bathe and keep that foot out of the shower or washing at the sink would be best until the post op dressing has been removed. Pt voiced understanding and will call with questions.

## 2021-01-19 ENCOUNTER — Encounter (HOSPITAL_BASED_OUTPATIENT_CLINIC_OR_DEPARTMENT_OTHER): Payer: Self-pay | Admitting: Orthopedic Surgery

## 2021-01-19 ENCOUNTER — Telehealth: Payer: Self-pay | Admitting: Orthopedic Surgery

## 2021-01-19 MED ORDER — OXYCODONE HCL 5 MG PO CAPS: 5.0000 mg | ORAL_CAPSULE | ORAL | 0 refills | Status: DC | PRN

## 2021-01-19 MED ORDER — HYDROCODONE-ACETAMINOPHEN 5-325 MG PO TABS: 1.0000 | ORAL_TABLET | ORAL | 0 refills | Status: DC | PRN

## 2021-01-19 NOTE — Telephone Encounter (Signed)
Pt called stating she isn't really supposed to be taking tylenol but she hadn't thought about this when Autumn asked which pain rx she would prefer. So th pt would like to have something other than her current rx sent in please. Pt would like a CB when this has been changed.

## 2021-01-19 NOTE — Addendum Note (Signed)
Addended by: Dondra Prader R on: 01/19/2021 03:49 PM   Modules accepted: Orders

## 2021-01-19 NOTE — Telephone Encounter (Signed)
Pt called again checking on the switch for her pain rx; she states the nerve block is starting to wear off so she would like to have this changed before the pain can fully kick in.   [phone]

## 2021-01-19 NOTE — Telephone Encounter (Signed)
I called and her advised that Junie Panning has sent in Oxycodone into her pharmacy

## 2021-01-25 ENCOUNTER — Ambulatory Visit (INDEPENDENT_AMBULATORY_CARE_PROVIDER_SITE_OTHER): Payer: Medicare Other | Admitting: Family

## 2021-01-25 ENCOUNTER — Encounter: Payer: Self-pay | Admitting: Family

## 2021-01-25 ENCOUNTER — Other Ambulatory Visit: Payer: Self-pay

## 2021-01-25 DIAGNOSIS — M7742 Metatarsalgia, left foot: Secondary | ICD-10-CM

## 2021-01-25 DIAGNOSIS — M205X2 Other deformities of toe(s) (acquired), left foot: Secondary | ICD-10-CM

## 2021-01-25 DIAGNOSIS — M2022 Hallux rigidus, left foot: Secondary | ICD-10-CM

## 2021-01-25 DIAGNOSIS — D1724 Benign lipomatous neoplasm of skin and subcutaneous tissue of left leg: Secondary | ICD-10-CM

## 2021-01-25 MED ORDER — CEPHALEXIN 500 MG PO CAPS: 500.0000 mg | ORAL_CAPSULE | Freq: Three times a day (TID) | ORAL | 0 refills | Status: DC

## 2021-01-25 NOTE — Progress Notes (Signed)
Post-Op Visit Note   Patient: Kimberly Reyes           Date of Birth: 1956/08/02           MRN: [id number] Visit Date: 01/25/2021 PCP: Sharilyn Sites, MD  Chief Complaint:  Chief Complaint  Patient presents with  . Left Foot - Routine Post Op    HPI:  HPI The patient is a 65 year old woman who presents 1 week status post Weil osteotomy of the left second and third metatarsals with arthrodesis and excision of left ankle lipoma.  She has been using a kneeling scooter to not weight-bear in her home since surgery she has no concerns of pain.  Unfortunately she is concerned about some chills and high temperature she has been running since surgery.  Over the weekend she checked her temperature and it was as high as 100.3.  She reports this is high for her.  She also reports some urinary frequency since surgery   Ortho Exam [place] of the left foot the incisions are well approximated with sutures there is no gaping or drainage there is very mild erythema there is no warmth no cellulitis  Visit Diagnoses:  1. Claw toe, acquired, left   2. Hallux rigidus, left foot   3. Lipoma of left lower extremity   4. Metatarsalgia of left foot     Plan: We will place on a short course of Keflex.  She will follow-up in 1 week with radiographs of the left foot evaluate for suture removal at that time continue nonweightbearing.  Begin daily Dial soap cleansing and dry dressings.  Follow-Up Instructions: No follow-ups on file.   Imaging: No results found.  Orders:  No orders of the defined types were placed in this encounter.  Meds ordered this encounter  Medications  . cephALEXin (KEFLEX) 500 MG capsule    Sig: Take 1 capsule (500 mg total) by mouth 3 (three) times daily.    Dispense:  30 capsule    Refill:  0     PMFS History: Patient Active Problem List   Diagnosis Date Noted  . Hallux rigidus, left foot   . Claw toe, acquired, left   . Mass of left foot   . Family hx of colon  cancer 03/29/2018  . Elevated LFTs 08/06/2017  . Primary osteoarthritis of right hip 08/06/2017  . Primary osteoarthritis of both feet 08/06/2017  . Primary osteoarthritis of both hands 08/06/2017  . Myofascial pain 08/06/2017  . Hypothyroid 11/25/2014  . Hypothyroidism 11/25/2014   Past Medical History:  Diagnosis Date  . Anxiety   . Arthritis   . Asthma   40 yrs ago   not current probem no inhaler use  . Autoimmune hepatitis ([place])   . Headache(784.0)   . Hypothyroidism     Family History  Problem Relation Age of Onset  . Colon cancer Father   . CVA Mother   . Pneumonia Brother   . Cancer Sister        breast, thyroid   . Heart disease Sister   . Healthy Son     Past Surgical History:  Procedure Laterality Date  . ABDOMINAL HYSTERECTOMY     20 years ago, complete  . ARTHRODESIS METATARSALPHALANGEAL JOINT (MTPJ) Left 01/18/2021   Procedure: LEFT FOOT FIRST METATARSAL PHALANGEAL ARTHRODESIS AND EXCISION OF LEFT ANKLE LIPOMA;  Surgeon: Newt Minion, MD;  Location: [facility];  Service: Orthopedics;  Laterality: Left;  . CHOLECYSTECTOMY N/A 11/03/2014  Procedure: LAPAROSCOPIC CHOLECYSTECTOMY WITH INTRAOPERATIVE CHOLANGIOGRAM;  Surgeon: Gayland Curry, MD;  [facility];  Service: General;  Laterality: N/A;  . COLONOSCOPY N/A 03/27/2013   Procedure: COLONOSCOPY;  Surgeon: Rogene Houston, MD;  [facility];  Service: Endoscopy;  Laterality: N/A;  1030  . COLONOSCOPY N/A 06/05/2018   Procedure: COLONOSCOPY;  Surgeon: Rogene Houston, MD;  [facility];  Service: Endoscopy;  Laterality: N/A;  1025  . KNEE ARTHROSCOPY W/ MENISCAL REPAIR Right   . TUBAL LIGATION    . WEIL OSTEOTOMY Left 01/18/2021   Procedure: WEIL OSTEOTOMY LEFT 2ND AND 3RD METATARSAL;  Surgeon: Newt Minion, MD;  Location: [facility];  Service: Orthopedics;  Laterality: Left;   Social History   Occupational History  . Not on file  Tobacco Use  .  Smoking status: Never Smoker  . Smokeless tobacco: Never Used  Vaping Use  . Vaping Use: Never used  Substance and Sexual Activity  . Alcohol use: Not Currently  . Drug use: Never  . Sexual activity: Not on file

## 2021-01-26 ENCOUNTER — Telehealth: Payer: Self-pay | Admitting: Radiology

## 2021-01-26 ENCOUNTER — Telehealth: Payer: Self-pay

## 2021-01-26 MED ORDER — OXYCODONE HCL 5 MG PO CAPS: 5.0000 mg | ORAL_CAPSULE | Freq: Four times a day (QID) | ORAL | 0 refills | Status: DC | PRN

## 2021-01-26 NOTE — Telephone Encounter (Signed)
Kimberly Reyes called and sw the pt. She was in the office yesterday. Advised pt to continue to elevate and provided reassurance will call with any questions.

## 2021-01-26 NOTE — Telephone Encounter (Signed)
The answering service called, states that the patient had surgery last Tuesday on her left foot, states that she took a shower and that afterwards her foot is blue and swollen.  I tried to call her but there was no answer. I was going to work her in on the schedule this afternoon.  I did leave her a message to call the office back and schedule an appt. Her call back number is [phone]

## 2021-01-26 NOTE — Telephone Encounter (Signed)
Patient called regarding last message she is waiting for a call back, patient is also requesting a rx refill for oxycodone call back:[phone]

## 2021-01-26 NOTE — Addendum Note (Signed)
Addended by: Dondra Prader R on: 01/26/2021 02:55 PM   Modules accepted: Orders

## 2021-01-26 NOTE — Telephone Encounter (Signed)
This was done and rx for pain medication sent to pharm.

## 2021-02-02 ENCOUNTER — Ambulatory Visit (INDEPENDENT_AMBULATORY_CARE_PROVIDER_SITE_OTHER): Payer: Medicare Other

## 2021-02-02 ENCOUNTER — Ambulatory Visit (INDEPENDENT_AMBULATORY_CARE_PROVIDER_SITE_OTHER): Payer: Medicare Other | Admitting: Family

## 2021-02-02 ENCOUNTER — Encounter: Payer: Self-pay | Admitting: Family

## 2021-02-02 DIAGNOSIS — M2022 Hallux rigidus, left foot: Secondary | ICD-10-CM

## 2021-02-02 DIAGNOSIS — M7742 Metatarsalgia, left foot: Secondary | ICD-10-CM

## 2021-02-02 DIAGNOSIS — M205X2 Other deformities of toe(s) (acquired), left foot: Secondary | ICD-10-CM

## 2021-02-02 NOTE — Progress Notes (Signed)
Post-Op Visit Note   Patient: Kimberly Reyes           Date of Birth: 1955-12-21           MRN: [id number] Visit Date: 02/02/2021 PCP: Sharilyn Sites, MD  Chief Complaint:  No chief complaint on file.   HPI:  HPI The patient is a 65 year old woman who presents status post Weil osteotomy of the left second and third metatarsals with arthrodesis and excision of left ankle lipoma.  She has been using a kneeling scooter to not weight-bear in her home since surgery she continues with some issues with pain.    Ortho Exam Examination of left foot the incisions are well approximated with sutures there is no gaping or drainage there is no erythema there is no warmth no cellulitis  Visit Diagnoses:  No diagnosis found.  Plan: may begin weight bearing in post op shoe. continue daily Dial soap cleansing and dry dressings.  Follow-Up Instructions: No follow-ups on file.   Imaging: No results found.  Orders:  No orders of the defined types were placed in this encounter.  No orders of the defined types were placed in this encounter.    PMFS History: Patient Active Problem List   Diagnosis Date Noted  . Hallux rigidus, left foot   . Claw toe, acquired, left   . Mass of left foot   . Family hx of colon cancer 03/29/2018  . Elevated LFTs 08/06/2017  . Primary osteoarthritis of right hip 08/06/2017  . Primary osteoarthritis of both feet 08/06/2017  . Primary osteoarthritis of both hands 08/06/2017  . Myofascial pain 08/06/2017  . Hypothyroid 11/25/2014  . Hypothyroidism 11/25/2014   Past Medical History:  Diagnosis Date  . Anxiety   . Arthritis   . Asthma   40 yrs ago   not current probem no inhaler use  . Autoimmune hepatitis ([place])   . Headache(784.0)   . Hypothyroidism     Family History  Problem Relation Age of Onset  . Colon cancer Father   . CVA Mother   . Pneumonia Brother   . Cancer Sister        breast, thyroid   . Heart disease Sister   . Healthy Son      Past Surgical History:  Procedure Laterality Date  . ABDOMINAL HYSTERECTOMY     20 years ago, complete  . ARTHRODESIS METATARSALPHALANGEAL JOINT (MTPJ) Left 01/18/2021   Procedure: LEFT FOOT FIRST METATARSAL PHALANGEAL ARTHRODESIS AND EXCISION OF LEFT ANKLE LIPOMA;  Surgeon: Newt Minion, MD;  Location: [facility];  Service: Orthopedics;  Laterality: Left;  . CHOLECYSTECTOMY N/A 11/03/2014   Procedure: LAPAROSCOPIC CHOLECYSTECTOMY WITH INTRAOPERATIVE CHOLANGIOGRAM;  Surgeon: Gayland Curry, MD;  [facility];  Service: General;  Laterality: N/A;  . COLONOSCOPY N/A 03/27/2013   Procedure: COLONOSCOPY;  Surgeon: Rogene Houston, MD;  [facility];  Service: Endoscopy;  Laterality: N/A;  1030  . COLONOSCOPY N/A 06/05/2018   Procedure: COLONOSCOPY;  Surgeon: Rogene Houston, MD;  [facility];  Service: Endoscopy;  Laterality: N/A;  1025  . KNEE ARTHROSCOPY W/ MENISCAL REPAIR Right   . TUBAL LIGATION    . WEIL OSTEOTOMY Left 01/18/2021   Procedure: WEIL OSTEOTOMY LEFT 2ND AND 3RD METATARSAL;  Surgeon: Newt Minion, MD;  Location: [facility];  Service: Orthopedics;  Laterality: Left;   Social History   Occupational History  . Not on file  Tobacco Use  .  Smoking status: Never Smoker  . Smokeless tobacco: Never Used  Vaping Use  . Vaping Use: Never used  Substance and Sexual Activity  . Alcohol use: Not Currently  . Drug use: Never  . Sexual activity: Not on file

## 2021-02-16 ENCOUNTER — Ambulatory Visit (INDEPENDENT_AMBULATORY_CARE_PROVIDER_SITE_OTHER): Payer: Medicare Other | Admitting: Family

## 2021-02-16 ENCOUNTER — Encounter: Payer: Self-pay | Admitting: Family

## 2021-02-16 ENCOUNTER — Encounter: Payer: Medicare Other | Admitting: Family

## 2021-02-16 DIAGNOSIS — M19071 Primary osteoarthritis, right ankle and foot: Secondary | ICD-10-CM

## 2021-02-16 DIAGNOSIS — M19072 Primary osteoarthritis, left ankle and foot: Secondary | ICD-10-CM

## 2021-02-16 DIAGNOSIS — M205X2 Other deformities of toe(s) (acquired), left foot: Secondary | ICD-10-CM

## 2021-02-16 NOTE — Progress Notes (Signed)
Post-Op Visit Note   Patient: Kimberly Reyes           Date of Birth: 11-08-1955           MRN: [id number] Visit Date: 02/16/2021 PCP: Sharilyn Sites, MD  Chief Complaint: No chief complaint on file.   HPI:  HPI The patient is a 65 year old woman seen 4 weeks post op. Is doing quite well. Full weight bearing in post op shoe, minimal pian. Feels well.   Ortho Exam Incisions are well healed. No erythema no open area. No drainage. Moderate edema to foot and ankle.   Visit Diagnoses: No diagnosis found.  Plan: follow up in office once more in 2 weeks. Anticipate advancing to regular shoe wear at that time.  Follow-Up Instructions: No follow-ups on file.   Imaging: No results found.  Orders:  No orders of the defined types were placed in this encounter.  No orders of the defined types were placed in this encounter.    PMFS History: Patient Active Problem List   Diagnosis Date Noted  . Hallux rigidus, left foot   . Claw toe, acquired, left   . Mass of left foot   . Family hx of colon cancer 03/29/2018  . Elevated LFTs 08/06/2017  . Primary osteoarthritis of right hip 08/06/2017  . Primary osteoarthritis of both feet 08/06/2017  . Primary osteoarthritis of both hands 08/06/2017  . Myofascial pain 08/06/2017  . Hypothyroid 11/25/2014  . Hypothyroidism 11/25/2014   Past Medical History:  Diagnosis Date  . Anxiety   . Arthritis   . Asthma   40 yrs ago   not current probem no inhaler use  . Autoimmune hepatitis ([place])   . Headache(784.0)   . Hypothyroidism     Family History  Problem Relation Age of Onset  . Colon cancer Father   . CVA Mother   . Pneumonia Brother   . Cancer Sister        breast, thyroid   . Heart disease Sister   . Healthy Son     Past Surgical History:  Procedure Laterality Date  . ABDOMINAL HYSTERECTOMY     20 years ago, complete  . ARTHRODESIS METATARSALPHALANGEAL JOINT (MTPJ) Left 01/18/2021   Procedure: LEFT FOOT FIRST METATARSAL  PHALANGEAL ARTHRODESIS AND EXCISION OF LEFT ANKLE LIPOMA;  Surgeon: Newt Minion, MD;  Location: [facility];  Service: Orthopedics;  Laterality: Left;  . CHOLECYSTECTOMY N/A 11/03/2014   Procedure: LAPAROSCOPIC CHOLECYSTECTOMY WITH INTRAOPERATIVE CHOLANGIOGRAM;  Surgeon: Gayland Curry, MD;  [facility];  Service: General;  Laterality: N/A;  . COLONOSCOPY N/A 03/27/2013   Procedure: COLONOSCOPY;  Surgeon: Rogene Houston, MD;  [facility];  Service: Endoscopy;  Laterality: N/A;  1030  . COLONOSCOPY N/A 06/05/2018   Procedure: COLONOSCOPY;  Surgeon: Rogene Houston, MD;  [facility];  Service: Endoscopy;  Laterality: N/A;  1025  . KNEE ARTHROSCOPY W/ MENISCAL REPAIR Right   . TUBAL LIGATION    . WEIL OSTEOTOMY Left 01/18/2021   Procedure: WEIL OSTEOTOMY LEFT 2ND AND 3RD METATARSAL;  Surgeon: Newt Minion, MD;  Location: [facility];  Service: Orthopedics;  Laterality: Left;   Social History   Occupational History  . Not on file  Tobacco Use  . Smoking status: Never Smoker  . Smokeless tobacco: Never Used  Vaping Use  . Vaping Use: Never used  Substance and Sexual Activity  . Alcohol use: Not Currently  .  Drug use: Never  . Sexual activity: Not on file

## 2021-03-09 ENCOUNTER — Ambulatory Visit (INDEPENDENT_AMBULATORY_CARE_PROVIDER_SITE_OTHER): Payer: Medicare Other | Admitting: Family

## 2021-03-09 ENCOUNTER — Encounter: Payer: Self-pay | Admitting: Family

## 2021-03-09 DIAGNOSIS — R2242 Localized swelling, mass and lump, left lower limb: Secondary | ICD-10-CM

## 2021-03-09 DIAGNOSIS — M205X2 Other deformities of toe(s) (acquired), left foot: Secondary | ICD-10-CM

## 2021-03-09 DIAGNOSIS — M2022 Hallux rigidus, left foot: Secondary | ICD-10-CM

## 2021-03-09 MED ORDER — AMOXICILLIN-POT CLAVULANATE 875-125 MG PO TABS: 1.0000 | ORAL_TABLET | Freq: Two times a day (BID) | ORAL | 0 refills | Status: DC

## 2021-03-09 NOTE — Progress Notes (Signed)
Post-Op Visit Note   Patient: Kimberly Reyes           Date of Birth: [birth year]           MRN: [id number] Visit Date: 03/09/2021 PCP: Sharilyn Sites, MD  Chief Complaint:  Chief Complaint  Patient presents with   Left Foot - Routine Post Op    01/18/21 weil osteotomy 2nd and 3rd MT     HPI:  HPI Patient is a 65 year old woman seen status post Weil osteotomies second and third metatarsals of the left foot as well as fusion of the great toe and excision of the lipoma.  She continues to have some issues with swelling and some pins-and-needles feeling in her entire foot.  She has been full weightbearing in her postop shoe doing all of her daily activities without any issue Denies any systemic symptoms  Ortho Exam On examination of the left foot the distal incisions are well-healed the lateral ankle incision has 1 area of hypergranulation tissue this is 2 mm in diameter and proud about 2 mm.  There is no drainage no surrounding erythema there is some surrounding edema and tenderness no warmth. Hypergranulation touched with silver nitrate.  Visit Diagnoses:  1. Claw toe, acquired, left   2. Hallux rigidus, left foot   3. Mass of left foot     Plan: We will place her on antibiotics.  She may continue her weightbearing and stiff shoe wear.  Reassurance provided for the edema and numbness and tingling.  She will get into a compression stocking for the swelling follow-up in the office in 6 weeks if she fails to improve.  Follow-Up Instructions: Return in about 6 weeks (around 04/20/2021), or if symptoms worsen or fail to improve.   Imaging: No results found.  Orders:  No orders of the defined types were placed in this encounter.  No orders of the defined types were placed in this encounter.    PMFS History: Patient Active Problem List   Diagnosis Date Noted   Hallux rigidus, left foot    Claw toe, acquired, left    Mass of left foot    Family hx of colon cancer 03/29/2018    Elevated LFTs 08/06/2017   Primary osteoarthritis of right hip 08/06/2017   Primary osteoarthritis of both feet 08/06/2017   Primary osteoarthritis of both hands 08/06/2017   Myofascial pain 08/06/2017   Hypothyroid 11/25/2014   Hypothyroidism 11/25/2014   Past Medical History:  Diagnosis Date   Anxiety    Arthritis    Asthma   40 yrs ago   not current probem no inhaler use   Autoimmune hepatitis ([place])    Headache(784.0)    Hypothyroidism     Family History  Problem Relation Age of Onset   Colon cancer Father    CVA Mother    Pneumonia Brother    Cancer Sister        breast, thyroid    Heart disease Sister    Healthy Son     Past Surgical History:  Procedure Laterality Date   ABDOMINAL HYSTERECTOMY     20 years ago, complete   ARTHRODESIS METATARSALPHALANGEAL JOINT (MTPJ) Left 01/18/2021   Procedure: LEFT FOOT FIRST METATARSAL PHALANGEAL ARTHRODESIS AND EXCISION OF LEFT ANKLE LIPOMA;  Surgeon: Newt Minion, MD;  Location: [facility];  Service: Orthopedics;  Laterality: Left;   CHOLECYSTECTOMY N/A 11/03/2014   Procedure: LAPAROSCOPIC CHOLECYSTECTOMY WITH INTRAOPERATIVE CHOLANGIOGRAM;  Surgeon: Gayland Curry, MD;  [facility];  Service: General;  Laterality: N/A;   COLONOSCOPY N/A 03/27/2013   Procedure: COLONOSCOPY;  Surgeon: Rogene Houston, MD;  [facility];  Service: Endoscopy;  Laterality: N/A;  1030   COLONOSCOPY N/A 06/05/2018   Procedure: COLONOSCOPY;  Surgeon: Rogene Houston, MD;  [facility];  Service: Endoscopy;  Laterality: N/A;  1025   KNEE ARTHROSCOPY W/ MENISCAL REPAIR Right    TUBAL LIGATION     WEIL OSTEOTOMY Left 01/18/2021   Procedure: WEIL OSTEOTOMY LEFT 2ND AND 3RD METATARSAL;  Surgeon: Newt Minion, MD;  Location: [facility];  Service: Orthopedics;  Laterality: Left;   Social History   Occupational History   Not on file  Tobacco Use   Smoking status: Never   Smokeless tobacco: Never   Vaping Use   Vaping Use: Never used  Substance and Sexual Activity   Alcohol use: Not Currently   Drug use: Never   Sexual activity: Not on file

## 2021-04-12 ENCOUNTER — Other Ambulatory Visit: Payer: Self-pay

## 2021-04-12 ENCOUNTER — Ambulatory Visit (INDEPENDENT_AMBULATORY_CARE_PROVIDER_SITE_OTHER): Payer: Medicare Other | Admitting: Orthopedic Surgery

## 2021-04-12 ENCOUNTER — Encounter: Payer: Self-pay | Admitting: Orthopedic Surgery

## 2021-04-12 DIAGNOSIS — M205X2 Other deformities of toe(s) (acquired), left foot: Secondary | ICD-10-CM

## 2021-04-12 NOTE — Progress Notes (Signed)
Office Visit Note   Patient: Kimberly Reyes           Date of Birth: 1956/07/29           MRN: [id number] Visit Date: 04/12/2021              Requested by: Sharilyn Sites, [address] [place],  [place] [id number] PCP: Sharilyn Sites, MD  Chief Complaint  Patient presents with   Left Foot - Follow-up      HPI: Patient is a 65 year old woman who is status post Weil osteotomy for the second and third metatarsals and a fusion of the great toe MTP joint.  Patient states she has tried running and long distance walking and has been having increasing pain and swelling.  Assessment & Plan: Visit Diagnoses:  1. Claw toe, acquired, left     Plan: We will have patient shut things down wear her stiff soled shoe she was given a compression stocking to help with the swelling follow-up in 4 weeks.  Obtain three-view radiographs of the left foot at follow-up.  Follow-Up Instructions: Return in about 4 weeks (around 05/10/2021).   Ortho Exam  Patient is alert, oriented, no adenopathy, well-dressed, normal affect, normal respiratory effort. Patient is 3 months status post great toe MTP fusion and Weil osteotomy for the second and third metatarsals.  She does have swelling across the forefoot there is no redness no cellulitis no signs of infection.  With the increased swelling patient may have a delayed union of the great toe MTP joint.  Her calf measures 39 cm in circumference and she was given a size medium compression stocking.  Imaging: No results found. No images are attached to the encounter.  Labs: Lab Results  Component Value Date   ESRSEDRATE 37 (H) 03/01/2017     Lab Results  Component Value Date   ALBUMIN 3.8 01/14/2021   ALBUMIN 3.7 03/13/2017   ALBUMIN 3.7 03/06/2017    No results found for: MG No results found for: VD25OH  No results found for: PREALBUMIN CBC EXTENDED Latest Ref Rng & Units 07/20/2017 06/26/2017 02/07/2017  WBC 4.0 - 10.5 K/uL 8.4 8.3 7.8   RBC 3.87 - 5.11 MIL/uL 3.89 3.95 3.83(L)  HGB 12.0 - 15.0 g/dL 11.1(L) 11.0(L) 10.9(L)  HCT 36.0 - 46.0 % 34.7(L) 34.0(L) 33.3(L)  PLT 150 - 400 K/uL 193 210 200  NEUTROABS 1,500 - 7,800 cells/uL - 5,212 4.0  LYMPHSABS 850 - 3,900 cells/uL - 1,934 2.2     There is no height or weight on file to calculate BMI.  Orders:  No orders of the defined types were placed in this encounter.  No orders of the defined types were placed in this encounter.    Procedures: No procedures performed  Clinical Data: No additional findings.  ROS:  All other systems negative, except as noted in the HPI. Review of Systems  Objective: Vital Signs: There were no vitals taken for this visit.  Specialty Comments:  No specialty comments available.  PMFS History: Patient Active Problem List   Diagnosis Date Noted   Hallux rigidus, left foot    Claw toe, acquired, left    Mass of left foot    Family hx of colon cancer 03/29/2018   Elevated LFTs 08/06/2017   Primary osteoarthritis of right hip 08/06/2017   Primary osteoarthritis of both feet 08/06/2017   Primary osteoarthritis of both hands 08/06/2017   Myofascial pain 08/06/2017   Hypothyroid 11/25/2014   Hypothyroidism  11/25/2014   Past Medical History:  Diagnosis Date   Anxiety    Arthritis    Asthma   40 yrs ago   not current probem no inhaler use   Autoimmune hepatitis ([place])    Headache(784.0)    Hypothyroidism     Family History  Problem Relation Age of Onset   Colon cancer Father    CVA Mother    Pneumonia Brother    Cancer Sister        breast, thyroid    Heart disease Sister    Healthy Son     Past Surgical History:  Procedure Laterality Date   ABDOMINAL HYSTERECTOMY     20 years ago, complete   ARTHRODESIS METATARSALPHALANGEAL JOINT (MTPJ) Left 01/18/2021   Procedure: LEFT FOOT FIRST METATARSAL PHALANGEAL ARTHRODESIS AND EXCISION OF LEFT ANKLE LIPOMA;  Surgeon: Newt Minion, MD;  Location: [facility];  Service: Orthopedics;  Laterality: Left;   CHOLECYSTECTOMY N/A 11/03/2014   Procedure: LAPAROSCOPIC CHOLECYSTECTOMY WITH INTRAOPERATIVE CHOLANGIOGRAM;  Surgeon: Gayland Curry, MD;  Location: [facility];  Service: General;  Laterality: N/A;   COLONOSCOPY N/A 03/27/2013   Procedure: COLONOSCOPY;  Surgeon: Rogene Houston, MD;  [facility];  Service: Endoscopy;  Laterality: N/A;  1030   COLONOSCOPY N/A 06/05/2018   Procedure: COLONOSCOPY;  Surgeon: Rogene Houston, MD;  [facility];  Service: Endoscopy;  Laterality: N/A;  1025   KNEE ARTHROSCOPY W/ MENISCAL REPAIR Right    TUBAL LIGATION     WEIL OSTEOTOMY Left 01/18/2021   Procedure: WEIL OSTEOTOMY LEFT 2ND AND 3RD METATARSAL;  Surgeon: Newt Minion, MD;  Location: [facility];  Service: Orthopedics;  Laterality: Left;   Social History   Occupational History   Not on file  Tobacco Use   Smoking status: Never   Smokeless tobacco: Never  Vaping Use   Vaping Use: Never used  Substance and Sexual Activity   Alcohol use: Not Currently   Drug use: Never   Sexual activity: Not on file

## 2021-04-13 ENCOUNTER — Telehealth: Payer: Self-pay | Admitting: Orthopedic Surgery

## 2021-04-13 NOTE — Telephone Encounter (Signed)
You saw this pt in the office yesterday please see below and advise.

## 2021-04-13 NOTE — Telephone Encounter (Signed)
Pt called stating she had an appt on 04/12/21 and her foot has been healing very slowly. So the pt spoke with her liver specialist and he told her to make Dr.Duda aware that she's taking 5 mg of prednisone and 50 mg of Imuran. Pt stated her liver specialist believes the combination or even just the prednisone might be causing the delay in the healing process; he wanted the pt to ask if she should stop the prednisone for awhile? Pt would like a CB with an answer please.   [phone]

## 2021-04-13 NOTE — Telephone Encounter (Signed)
Pt called about update of her first message and also would like to add if Dr. Sharol Given think she need another xray. Please call this pt about this matter at [phone].

## 2021-04-13 NOTE — Telephone Encounter (Signed)
I called pt and advised that I did sent her message from this morning to Dr. Sharol Given for review. He is in surgery today and I will call to advise once the message has come back to me. I did tell the pt that he had dictated the office note from yesterday that 3 views of the foot were to be obtained at the next office visit 05/12/21. I will call once I have received the first message from Dr. Sharol Given pt voiced understanding.

## 2021-04-21 ENCOUNTER — Ambulatory Visit: Payer: Medicare Other | Admitting: Orthopedic Surgery

## 2021-05-12 ENCOUNTER — Ambulatory Visit (INDEPENDENT_AMBULATORY_CARE_PROVIDER_SITE_OTHER): Payer: Medicare Other | Admitting: Orthopedic Surgery

## 2021-05-12 ENCOUNTER — Ambulatory Visit (INDEPENDENT_AMBULATORY_CARE_PROVIDER_SITE_OTHER): Payer: Medicare Other

## 2021-05-12 ENCOUNTER — Encounter: Payer: Self-pay | Admitting: Orthopedic Surgery

## 2021-05-12 DIAGNOSIS — M9689 Other intraoperative and postprocedural complications and disorders of the musculoskeletal system: Secondary | ICD-10-CM

## 2021-05-12 DIAGNOSIS — M205X2 Other deformities of toe(s) (acquired), left foot: Secondary | ICD-10-CM

## 2021-05-12 NOTE — Progress Notes (Signed)
Office Visit Note   Patient: Kimberly Reyes           Date of Birth: 12-20-1955           MRN: [id number] Visit Date: 05/12/2021              Requested by: Sharilyn Sites, [address] [place],   [id number] PCP: Sharilyn Sites, MD  Chief Complaint  Patient presents with   Left Foot - Follow-up      HPI: Patient is a 65 year old woman who presents in follow-up for MTP fusion of the left great toe for hallux rigidus as well as Weil osteotomy for the second and third metatarsals.  Patient states she has pain dorsally over the retained hardware left foot no pain beneath the metatarsal heads.  Patient has been wearing a compression sock but still has some swelling of the great toe MTP joint.  Assessment & Plan: Visit Diagnoses:  1. Claw toe, acquired, left   2. Nonunion of osteotomy site     Plan: Discussed with the patient her symptoms are consistent with a fibrous union.  Recommended sole orthotics and a carbon fiber plate for her sneakers.  Discussed that if she is not better in 6 weeks we will evaluate for removal of the deep retained hardware and revision fusion.  Follow-Up Instructions: Return in about 2 months (around 07/12/2021).   Ortho Exam  Patient is alert, oriented, no adenopathy, well-dressed, normal affect, normal respiratory effort. Examination patient has been wearing her Vive wear compression sock on the left but not the right.  The fungal infection of the left heel has completely resolved she still has a fungal infection on the right heel.  Patient has a good dorsalis pedis pulse.  Semination she is tender to palpation over the proximal and distal's hardware at the fusion site.  There does appear to be motion at the fusion site.  Radiograph shows lucency around the screws as well as hardware failure.  There is no redness no cellulitis no signs of infection.  Imaging: XR Foot Complete Left  Result Date: 05/12/2021 Three-view radiographs of the left foot  shows a lucency around the screws as well as hardware failure consistent with a fibrous union.  No images are attached to the encounter.  Labs: Lab Results  Component Value Date   ESRSEDRATE 37 (H) 03/01/2017     Lab Results  Component Value Date   ALBUMIN 3.8 01/14/2021   ALBUMIN 3.7 03/13/2017   ALBUMIN 3.7 03/06/2017    No results found for: MG No results found for: VD25OH  No results found for: PREALBUMIN CBC EXTENDED Latest Ref Rng & Units 07/20/2017 06/26/2017 02/07/2017  WBC 4.0 - 10.5 K/uL 8.4 8.3 7.8  RBC 3.87 - 5.11 MIL/uL 3.89 3.95 3.83(L)  HGB 12.0 - 15.0 g/dL 11.1(L) 11.0(L) 10.9(L)  HCT 36.0 - 46.0 % 34.7(L) 34.0(L) 33.3(L)  PLT 150 - 400 K/uL 193 210 200  NEUTROABS 1,500 - 7,800 cells/uL - 5,212 4.0  LYMPHSABS 850 - 3,900 cells/uL - 1,934 2.2     There is no height or weight on file to calculate BMI.  Orders:  Orders Placed This Encounter  Procedures   XR Foot Complete Left   No orders of the defined types were placed in this encounter.    Procedures: No procedures performed  Clinical Data: No additional findings.  ROS:  All other systems negative, except as noted in the HPI. Review of Systems  Objective: Vital Signs:  There were no vitals taken for this visit.  Specialty Comments:  No specialty comments available.  PMFS History: Patient Active Problem List   Diagnosis Date Noted   Hallux rigidus, left foot    Claw toe, acquired, left    Mass of left foot    Family hx of colon cancer 03/29/2018   Elevated LFTs 08/06/2017   Primary osteoarthritis of right hip 08/06/2017   Primary osteoarthritis of both feet 08/06/2017   Primary osteoarthritis of both hands 08/06/2017   Myofascial pain 08/06/2017   Hypothyroid 11/25/2014   Hypothyroidism 11/25/2014   Past Medical History:  Diagnosis Date   Anxiety    Arthritis    Asthma   40 yrs ago   not current probem no inhaler use   Autoimmune hepatitis ([place])    Headache(784.0)     Hypothyroidism     Family History  Problem Relation Age of Onset   Colon cancer Father    CVA Mother    Pneumonia Brother    Cancer Sister        breast, thyroid    Heart disease Sister    Healthy Son     Past Surgical History:  Procedure Laterality Date   ABDOMINAL HYSTERECTOMY     20 years ago, complete   ARTHRODESIS METATARSALPHALANGEAL JOINT (MTPJ) Left 01/18/2021   Procedure: LEFT FOOT FIRST METATARSAL PHALANGEAL ARTHRODESIS AND EXCISION OF LEFT ANKLE LIPOMA;  Surgeon: Newt Minion, MD;  Location: [facility];  Service: Orthopedics;  Laterality: Left;   CHOLECYSTECTOMY N/A 11/03/2014   Procedure: LAPAROSCOPIC CHOLECYSTECTOMY WITH INTRAOPERATIVE CHOLANGIOGRAM;  Surgeon: Gayland Curry, MD;  [facility];  Service: General;  Laterality: N/A;   COLONOSCOPY N/A 03/27/2013   Procedure: COLONOSCOPY;  Surgeon: Rogene Houston, MD;  [facility];  Service: Endoscopy;  Laterality: N/A;  1030   COLONOSCOPY N/A 06/05/2018   Procedure: COLONOSCOPY;  Surgeon: Rogene Houston, MD;  [facility];  Service: Endoscopy;  Laterality: N/A;  1025   KNEE ARTHROSCOPY W/ MENISCAL REPAIR Right    TUBAL LIGATION     WEIL OSTEOTOMY Left 01/18/2021   Procedure: WEIL OSTEOTOMY LEFT 2ND AND 3RD METATARSAL;  Surgeon: Newt Minion, MD;  Location: [facility];  Service: Orthopedics;  Laterality: Left;   Social History   Occupational History   Not on file  Tobacco Use   Smoking status: Never   Smokeless tobacco: Never  Vaping Use   Vaping Use: Never used  Substance and Sexual Activity   Alcohol use: Not Currently   Drug use: Never   Sexual activity: Not on file

## 2021-05-17 ENCOUNTER — Telehealth: Payer: Self-pay | Admitting: Orthopedic Surgery

## 2021-05-17 NOTE — Telephone Encounter (Signed)
Received call from patient wanting to get copy of records & xrays. I mailed release form to pt per her request/address on file.

## 2021-05-19 ENCOUNTER — Telehealth: Payer: Self-pay

## 2021-05-19 NOTE — Telephone Encounter (Signed)
Spoke with patient and advised per Dr. Estanislado Pandy, Yes, she should get Reclast infusion.  Advised Dr. Estanislado Pandy would recommend an office visit to check labs and discuss Reclast infusion.  Follow up appointment scheduled for 05/26/2021 at 9:20 am.

## 2021-05-19 NOTE — Telephone Encounter (Signed)
Yes, she should get Reclast infusion.  I would recommend an office visit to check labs and discuss Reclast infusion.  Please schedule follow-up appointment when [facility] is in the office.

## 2021-05-19 NOTE — Telephone Encounter (Signed)
Patient left a voicemail stating she had surgery on her foot in May and it was suppose to be healed by July.  Patient states it is still not healed and the doctor said it is due to her bones being fibrous and failure of hardware.  Patient states her foot is still swollen and it still hurts.  Patient wanted to ask Dr. Estanislado Pandy if there is anything she can do before she has surgery again.

## 2021-05-19 NOTE — Telephone Encounter (Signed)
Patient states she had surgery on her left foot. Patient states the foot did not heal due to hardware failure, the fused area moving and her bones are fibrous. patient states it's classified as nonunion. Patient states she is supposed to have another surgery but does not have that scheduled yet. Patient would like to know what she should do to strengthen her bones prior to the surgery. Patient previously was on Reclast infusions with the last infusion being in 2020. Please advise.

## 2021-05-20 NOTE — Progress Notes (Signed)
Office Visit Note  Patient: Kimberly Reyes             Date of Birth: August 10, 1956           MRN: [id number]             PCP: Sharilyn Sites, MD Referring: Sharilyn Sites, MD Visit Date: 05/26/2021 Occupation: '@GUAROCC'$ @  Subjective:  Discuss restarting reclast  History of Present Illness: Kimberly Reyes is a 65 y.o. female with history of myofascial pain, osteoarthritis, autoimmune hepatitis, and osteopenia.  She remains on Imuran 50 mg 1 tablet by mouth daily and prednisone 5 mg daily prescribed by Dr. Benson Norway for autoimmune hepatitis.   Patient reports that she continues to have chronic pain in both knee joints.  She does not want to proceed with a knee replacement at this time.  She states that she had underwent MTP fusion of the left great toe as well as an osteotomy for the 2nd and third metatarsal heads. She continues to have pain and swelling which she attributes to the delayed healing.  She has an upcoming appointment with Dr. Sharol Given on 06/23/21 to discuss proceeding with hardware removal.   She presents today to discuss restarting on Reclast to promote the healing process. She previously tolerated reclast without any side effects.  She has been on a drug holiday since her last infusion in 2020.  She remains on prednisone 5 mg daily. She has been taking a calcium and vitamin D supplement daily.      Activities of Daily Living:  Patient reports morning stiffness for 20 minutes.   Patient Reports nocturnal pain.  Difficulty dressing/grooming: Denies Difficulty climbing stairs: Reports Difficulty getting out of chair: Denies Difficulty using hands for taps, buttons, cutlery, and/or writing: Denies  Review of Systems  Constitutional:  Negative for fatigue.  HENT:  Negative for mouth sores, mouth dryness and nose dryness.   Eyes:  Positive for dryness. Negative for pain and itching.  Respiratory:  Negative for shortness of breath and difficulty breathing.   Cardiovascular:  Negative  for chest pain and palpitations.  Gastrointestinal:  Positive for constipation. Negative for blood in stool and diarrhea.  Endocrine: Negative for increased urination.  Genitourinary:  Negative for difficulty urinating.  Musculoskeletal:  Positive for joint pain, joint pain, joint swelling and morning stiffness. Negative for myalgias, muscle tenderness and myalgias.  Skin:  Negative for color change, rash and redness.  Allergic/Immunologic: Negative for susceptible to infections.  Neurological:  Positive for headaches. Negative for dizziness, numbness, memory loss and weakness.  Hematological:  Positive for bruising/bleeding tendency.  Psychiatric/Behavioral:  Negative for confusion.    PMFS History:  Patient Active Problem List   Diagnosis Date Noted   Hallux rigidus, left foot    Claw toe, acquired, left    Mass of left foot    Family hx of colon cancer 03/29/2018   Elevated LFTs 08/06/2017   Primary osteoarthritis of right hip 08/06/2017   Primary osteoarthritis of both feet 08/06/2017   Primary osteoarthritis of both hands 08/06/2017   Myofascial pain 08/06/2017   Hypothyroid 11/25/2014   Hypothyroidism 11/25/2014    Past Medical History:  Diagnosis Date   Anxiety    Arthritis    Asthma   40 yrs ago   not current probem no inhaler use   Autoimmune hepatitis ([place])    Headache(784.0)    Hypothyroidism     Family History  Problem Relation Age of Onset   Colon cancer Father  CVA Mother    Pneumonia Brother    Cancer Sister        breast, thyroid    Heart disease Sister    Healthy Son    Past Surgical History:  Procedure Laterality Date   ABDOMINAL HYSTERECTOMY     20 years ago, complete   ARTHRODESIS METATARSALPHALANGEAL JOINT (MTPJ) Left 01/18/2021   Procedure: LEFT FOOT FIRST METATARSAL PHALANGEAL ARTHRODESIS AND EXCISION OF LEFT ANKLE LIPOMA;  Surgeon: Newt Minion, MD;  Location: [facility];  Service: Orthopedics;  Laterality: Left;    CHOLECYSTECTOMY N/A 11/03/2014   Procedure: LAPAROSCOPIC CHOLECYSTECTOMY WITH INTRAOPERATIVE CHOLANGIOGRAM;  Surgeon: Gayland Curry, MD;  Location: [facility];  Service: General;  Laterality: N/A;   COLONOSCOPY N/A 03/27/2013   Procedure: COLONOSCOPY;  Surgeon: Rogene Houston, MD;  [facility];  Service: Endoscopy;  Laterality: N/A;  1030   COLONOSCOPY N/A 06/05/2018   Procedure: COLONOSCOPY;  Surgeon: Rogene Houston, MD;  [facility];  Service: Endoscopy;  Laterality: N/A;  1025   KNEE ARTHROSCOPY W/ MENISCAL REPAIR Right    TUBAL LIGATION     WEIL OSTEOTOMY Left 01/18/2021   Procedure: WEIL OSTEOTOMY LEFT 2ND AND 3RD METATARSAL;  Surgeon: Newt Minion, MD;  Location: [facility];  Service: Orthopedics;  Laterality: Left;   Social History   Social History Narrative   Not on file   Immunization History  Administered Date(s) Administered   Influenza,inj,Quad PF,6+ Mos 07/07/2018   PFIZER(Purple Top)SARS-COV-2 Vaccination 10/23/2019, 11/20/2019, 05/06/2020, 03/23/2021     Objective: Vital Signs: BP 118/74 (BP Location: Left Arm, Patient Position: Sitting, Cuff Size: Normal)   Pulse 72   Ht 5' 8.5" (1.74 m)   Wt 186 lb (84.4 kg)   BMI 27.87 kg/m    Physical Exam Vitals and nursing note reviewed.  Constitutional:      Appearance: She is well-developed.  HENT:     Head: Normocephalic and atraumatic.  Eyes:     Conjunctiva/sclera: Conjunctivae normal.  Pulmonary:     Effort: Pulmonary effort is normal.  Abdominal:     Palpations: Abdomen is soft.  Musculoskeletal:     Cervical back: Normal range of motion.  Skin:    General: Skin is warm and dry.     Capillary Refill: Capillary refill takes less than 2 seconds.  Neurological:     Mental Status: She is alert and oriented to person, place, and time.  Psychiatric:        Behavior: Behavior normal.     Musculoskeletal Exam: C-spine has good range of motion.  Postural thoracic events as  noted.  Shoulder joints, would recommend restarting MCPs, PIPs, DIPs have good range of motion with no synovitis.  Tenderness and synovial thickening over the right second MCP joint.  PIP and DIP thickening noted.  Hip joints have good range of motion with no discomfort.  Knee joints have painful range of motion with some warmth in the right knee.  Ankle joints have good range of motion with no tenderness.  Tenderness and some diffuse swelling over the left great toe.  CDAI Exam: CDAI Score: -- Patient Global: --; Provider Global: -- Swollen: --; Tender: -- Joint Exam 05/26/2021   No joint exam has been documented for this visit   There is currently no information documented on the homunculus. Go to the Rheumatology activity and complete the homunculus joint exam.  Investigation: No additional findings.  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   Recent Labs: Lab Results  Component Value Date   WBC 8.4 07/20/2017   HGB 11.1 (L) 07/20/2017   PLT 193 07/20/2017   NA 137 01/14/2021   K 4.5 01/14/2021   CL 103 01/14/2021   CO2 26 01/14/2021   GLUCOSE 115 (H) 01/14/2021   BUN 18 01/14/2021   CREATININE 0.95 01/14/2021   BILITOT 0.7 01/14/2021   ALKPHOS 95 01/14/2021   AST 32 01/14/2021   ALT 21 01/14/2021   PROT 6.5 01/14/2021   ALBUMIN 3.8 01/14/2021   CALCIUM 9.3 01/14/2021   GFRAA 71 06/26/2017   QFTBGOLD NEGATIVE 06/26/2017    Speciality Comments: No specialty comments available.  Procedures:  No procedures performed Allergies: Patient has no known allergies.   Assessment / Plan:     Visit Diagnoses: Autoimmune hepatitis ([place]) - Followed by Dr. Benson Norway on a yearly basis and lab work every 3 months: She is clinically been doing well on Imuran 50 mg 1 tablet by mouth daily and prednisone 5 mg 1 tablet by mouth daily  prescribed by Dr. Benson Norway.  She is tolerating both medications without any new side effects.  She is unable to taper prednisone.  Her LFTs were within normal limits on 01/14/2021.  She continues to have lab work drawn every 3 months ordered by Dr. Almyra Free.  She will remain on the current treatment regimen.  High risk medication use - Imuran 50 mg 1 tablet by mouth daily, prednisone 5 mg 1 tablet by mouth daily. Her labs are monitored by Dr. Benson Norway.  CMP updated on 01/14/2021.  CBC and CMP will be drawn today to monitor for drug toxicity and in anticipation to restart on Reclast.- Plan: CBC with Differential/Platelet, COMPLETE METABOLIC PANEL WITH GFR  Myofascial pain: She experiences intermittent myalgias and muscle tenderness due to myofascial pain.  Overall her symptoms have been tolerable.  Primary osteoarthritis of both hands: She has PIP and DIP thickening consistent with osteoarthritis of both hands.  Some tenderness over the right second MCP joint but no synovitis noted.  Complete fist formation noted bilaterally.  Primary osteoarthritis of right hip: She has good range of motion of the right hip on examination today with no groin pain.  Trochanteric bursitis of both hips: She has some tenderness palpation over the left trochanteric bursa.  Discussed the importance of performing stretching exercises on a daily basis.  Her discomfort is exacerbated by laying on her left side at night.  Primary osteoarthritis of both knees - She has been followed by Dr. Noemi Chapel.  She continues to have chronic pain in both knee joints.  She has painful range of motion of both knees with warmth of the right knee.  She is not ready to proceed with a knee replacement at this time.  Primary osteoarthritis of both feet: She has been following up with Dr. Sharol Given.  Claw toe, acquired, left: She underwent a MTP fusion of the left great toe and an osteotomy of the left second and third metatarsal performed by Dr. Sharol Given on 01/18/2021.  She  had postop x-rays on 02/02/2021 which showed stable alignment of hardware and no loosening.  X-rays from 05/12/2021 showed left foot lucency around the screws as well as hardware failure consistent with a fibrous union.  She remains on prednisone 5 mg 1 tablet daily.  She continues to have  pain and swelling on the dorsal aspect of her left foot.  She presented today to discuss restarting on Reclast to try to improve her BMD prior to her next surgery.  She previously tolerated Reclast without any side effects.  She continues to take a calcium and vitamin D supplement as recommended. She has an upcoming appointment with Dr. Sharol Given on 06/23/2021 to discuss the next steps.  Osteopenia of multiple sites - DEXA on January 05, 2020 showed T score of -2.3, no significant change in BMD when compared to the DEXA scan of 2019.  Previously on Reclast-last infusion was 2020 at Dr. Almetta Lovely office.  She has been on a drug holiday since then.  She has been taking calcium and vitamin D as recommended.  She has not had any recent falls or fractures.  On 01/18/2021 she underwent a MTP fusion of the left great toe and osteotomy of the left second and third metatarsals performed by Dr. Sharol Given.  She had updated x-rays on 05/12/2021 which revealed lucency around the screws and hardware failure consistent with a fibrous union.  She remains on prednisone 5 mg daily for management of autoimmune hepatitis.  She is aware of the risks of long-term prednisone use.   Patient presents today to discuss restarting on Reclast.  Indications, contraindications, potential side effects of Reclast were discussed today in detail.  Questions were addressed.  We will obtain the following lab work prior to scheduling the next Reclast infusion.  She previously tolerated Reclast without any side effects.  She has advised to continue to take calcium and vitamin D supplements as recommended.  She will be due to update DEXA will April 2023.- Plan: Parathyroid hormone,  intact (no Ca), Phosphorus, TSH, CBC with Differential/Platelet, COMPLETE METABOLIC PANEL WITH GFR, VITAMIN D 25 Hydroxy (Vit-D Deficiency, Fractures)  On prednisone therapy - She is on long-term prednisone 5 mg 1 tablet by mouth daily.  Unable to taper.  She is aware of the risks of long-term prednisone use.  Vitamin D deficiency - She has been taking calcium and vitamin D supplement on a daily basis.  We will check vitamin D level today prior to scheduling Reclast.  Plan: VITAMIN D 25 Hydroxy (Vit-D Deficiency, Fractures)  Other kyphosis of thoracic region: Unchanged.   Other medical conditions are listed as follows:   History of hypothyroidism - Plan: TSH  COVID-19 virus infection - she had infection in February 2022.   Orders: Orders Placed This Encounter  Procedures   Parathyroid hormone, intact (no Ca)   Phosphorus   TSH   CBC with Differential/Platelet   COMPLETE METABOLIC PANEL WITH GFR   VITAMIN D 25 Hydroxy (Vit-D Deficiency, Fractures)   No orders of the defined types were placed in this encounter.     Follow-Up Instructions: Return in about 6 months (around 11/23/2021) for Myofascial pain, Osteoarthritis.   Ofilia Neas, PA-C  Note - This record has been created using Dragon software.  Chart creation errors have been sought, but may not always  have been located. Such creation errors do not reflect on  the standard of medical care.

## 2021-05-26 ENCOUNTER — Ambulatory Visit (INDEPENDENT_AMBULATORY_CARE_PROVIDER_SITE_OTHER): Payer: Medicare Other | Admitting: Physician Assistant

## 2021-05-26 ENCOUNTER — Other Ambulatory Visit: Payer: Self-pay

## 2021-05-26 ENCOUNTER — Telehealth: Payer: Self-pay | Admitting: Pharmacist

## 2021-05-26 ENCOUNTER — Encounter: Payer: Self-pay | Admitting: Physician Assistant

## 2021-05-26 VITALS — BP 118/74 | HR 72 | Ht 68.5 in | Wt 186.0 lb

## 2021-05-26 DIAGNOSIS — Z79899 Other long term (current) drug therapy: Secondary | ICD-10-CM

## 2021-05-26 DIAGNOSIS — M19041 Primary osteoarthritis, right hand: Secondary | ICD-10-CM

## 2021-05-26 DIAGNOSIS — E559 Vitamin D deficiency, unspecified: Secondary | ICD-10-CM

## 2021-05-26 DIAGNOSIS — M19072 Primary osteoarthritis, left ankle and foot: Secondary | ICD-10-CM

## 2021-05-26 DIAGNOSIS — M19042 Primary osteoarthritis, left hand: Secondary | ICD-10-CM

## 2021-05-26 DIAGNOSIS — K754 Autoimmune hepatitis: Secondary | ICD-10-CM | POA: Diagnosis not present

## 2021-05-26 DIAGNOSIS — M7918 Myalgia, other site: Secondary | ICD-10-CM | POA: Diagnosis not present

## 2021-05-26 DIAGNOSIS — U071 COVID-19: Secondary | ICD-10-CM

## 2021-05-26 DIAGNOSIS — M1611 Unilateral primary osteoarthritis, right hip: Secondary | ICD-10-CM

## 2021-05-26 DIAGNOSIS — Z8639 Personal history of other endocrine, nutritional and metabolic disease: Secondary | ICD-10-CM

## 2021-05-26 DIAGNOSIS — M170 Bilateral primary osteoarthritis of knee: Secondary | ICD-10-CM

## 2021-05-26 DIAGNOSIS — M19071 Primary osteoarthritis, right ankle and foot: Secondary | ICD-10-CM

## 2021-05-26 DIAGNOSIS — M205X2 Other deformities of toe(s) (acquired), left foot: Secondary | ICD-10-CM

## 2021-05-26 DIAGNOSIS — M8589 Other specified disorders of bone density and structure, multiple sites: Secondary | ICD-10-CM

## 2021-05-26 DIAGNOSIS — Z7952 Long term (current) use of systemic steroids: Secondary | ICD-10-CM

## 2021-05-26 DIAGNOSIS — M7061 Trochanteric bursitis, right hip: Secondary | ICD-10-CM

## 2021-05-26 DIAGNOSIS — M7062 Trochanteric bursitis, left hip: Secondary | ICD-10-CM

## 2021-05-26 DIAGNOSIS — M40294 Other kyphosis, thoracic region: Secondary | ICD-10-CM

## 2021-05-26 NOTE — Telephone Encounter (Signed)
Please start Reclast BIV through medical benefit.  Reclast - [id number]  Dose: '5mg'$  IV every 12 months  Dx: Age-related osteoporosis (M80.0)  Was previously on Reclast (last infusion 2020)  She would like to receive ideally next week since she has surgery scheduled in mid-October  Knox Saliva, PharmD, MPH, BCPS Clinical Pharmacist (Rheumatology and Pulmonology)

## 2021-05-27 ENCOUNTER — Other Ambulatory Visit: Payer: Self-pay | Admitting: Pharmacist

## 2021-05-27 DIAGNOSIS — M8589 Other specified disorders of bone density and structure, multiple sites: Secondary | ICD-10-CM

## 2021-05-27 LAB — COMPLETE METABOLIC PANEL WITH GFR
AG Ratio: 1.9 (calc) (ref 1.0–2.5)
ALT: 19 U/L (ref 6–29)
AST: 26 U/L (ref 10–35)
Albumin: 4.3 g/dL (ref 3.6–5.1)
Alkaline phosphatase (APISO): 106 U/L (ref 37–153)
BUN: 13 mg/dL (ref 7–25)
CO2: 28 mmol/L (ref 20–32)
Calcium: 9.5 mg/dL (ref 8.6–10.4)
Chloride: 104 mmol/L (ref 98–110)
Creat: 1.03 mg/dL (ref 0.50–1.05)
Globulin: 2.3 g/dL (calc) (ref 1.9–3.7)
Glucose, Bld: 124 mg/dL — ABNORMAL HIGH (ref 65–99)
Potassium: 4.4 mmol/L (ref 3.5–5.3)
Sodium: 141 mmol/L (ref 135–146)
Total Bilirubin: 0.5 mg/dL (ref 0.2–1.2)
Total Protein: 6.6 g/dL (ref 6.1–8.1)
eGFR: 60 mL/min/{1.73_m2} (ref >=60)

## 2021-05-27 LAB — CBC WITH DIFFERENTIAL/PLATELET
Absolute Monocytes: 827 cells/uL (ref 200–950)
Basophils Absolute: 62 cells/uL (ref 0–200)
Basophils Relative: 0.8 %
Eosinophils Absolute: 312 cells/uL (ref 15–500)
Eosinophils Relative: 4 %
HCT: 41.2 % (ref 35.0–45.0)
Hemoglobin: 13.3 g/dL (ref 11.7–15.5)
Lymphs Abs: 1810 cells/uL (ref 850–3900)
MCH: 30.2 pg (ref 27.0–33.0)
MCHC: 32.3 g/dL (ref 32.0–36.0)
MCV: 93.6 fL (ref 80.0–100.0)
MPV: 11.7 fL (ref 7.5–12.5)
Monocytes Relative: 10.6 %
Neutro Abs: 4789 cells/uL (ref 1500–7800)
Neutrophils Relative %: 61.4 %
Platelets: 206 10*3/uL (ref 140–400)
RBC: 4.4 10*6/uL (ref 3.80–5.10)
RDW: 12.5 % (ref 11.0–15.0)
Total Lymphocyte: 23.2 %
WBC: 7.8 10*3/uL (ref 3.8–10.8)

## 2021-05-27 LAB — TSH: TSH: 0.71 mIU/L (ref 0.40–4.50)

## 2021-05-27 LAB — PHOSPHORUS: Phosphorus: 3.5 mg/dL (ref 2.1–4.3)

## 2021-05-27 LAB — PARATHYROID HORMONE, INTACT (NO CA): PTH: 59 pg/mL (ref 16–77)

## 2021-05-27 LAB — VITAMIN D 25 HYDROXY (VIT D DEFICIENCY, FRACTURES): Vit D, 25-Hydroxy: 57 ng/mL (ref 30–100)

## 2021-05-27 NOTE — Progress Notes (Signed)
PTH WNL

## 2021-05-27 NOTE — Telephone Encounter (Signed)
Reclast orders placed for [facility]. ATC patient to provide [facility] phone number but unable to reach. Left VM requesting return call. Will send MyChart message.  Knox Saliva, PharmD, MPH, BCPS Clinical Pharmacist (Rheumatology and Pulmonology)

## 2021-05-27 NOTE — Telephone Encounter (Signed)
Findings of benefits investigation for Reclast [id number]?:   Insurance:?Medicare/ [employer] Medicare Supplement   Phone:??[phone]   Plans are ACTIVE  Medicare covers 80% of the infusion and no authorization is required, and the supplement would cover the 20% of the cost that was not paid for by Medicare as long as Medicare covered the medication?after patient pays her $42 Medicare B deductible?Marland Kitchen   Confirmation number [id number]

## 2021-05-27 NOTE — Progress Notes (Signed)
CBC WNL. Glucose is 124. Rest of CMP WNL. Calcium WNL.  Phosphorus and TSH WNL.  Vitamin D is within the desirable range.

## 2021-05-27 NOTE — Progress Notes (Signed)
Next Reclast not yet scheduled for Reclast IV and due for updated orders. Diagnosis:  Dose: '5mg'$  IV every 12 months  Last Clinic Visit: 05/26/21 Next Clinic Visit: 11/23/21  Last infusion: 2020 (unclear of when)  Labs: on 05/26/21 CBC wnl, CMP wnl, Vitamin D wnl  Orders placed for Reclast IV x 1 dose along with premedication of acetaminophen and diphenhydramine to be administered 30 minutes before medication infusion.  ATC patient to provide with phone number for: [facility] [phone]) [facility] but unable to reach. Left VM requesting return call  Will follow-up to ensured scheduled and completed  Knox Saliva, PharmD, MPH, BCPS Clinical Pharmacist (Rheumatology and Pulmonology)

## 2021-05-30 ENCOUNTER — Encounter: Payer: Self-pay | Admitting: Rheumatology

## 2021-05-30 ENCOUNTER — Ambulatory Visit: Payer: Medicare Other | Admitting: Physician Assistant

## 2021-05-31 ENCOUNTER — Telehealth: Payer: Self-pay | Admitting: Orthopedic Surgery

## 2021-05-31 NOTE — Telephone Encounter (Signed)
Please copy foot & ankle xrays from 08/16/2020 - present to disc. I will pick up. Patient is requesting records also, I will mail all together. Thanks

## 2021-05-31 NOTE — Telephone Encounter (Signed)
We had a detailed discussion at her recent appointment last week about her DEXA and treatment options. Dr. Estanislado Pandy and Knox Saliva also reviewed proceeding with reclast with the patient.  Onset of ONJ can be spontaneous or after insult (tooth extraction or dental implant).  Invasive dental procedures should be avoided during therapy.    Most cases of ONJ have been in cancer patients treated with high doses of IV bisphonates. Prevalence: 1:10,000 to 1:100,000 patient-years in patients taking oral bisphonates for osteoporosis.    It is recommended to have all invasive dental procedures prior to starting reclast.  She should wait until she has completely healed and has been cleared by her oral surgeon before initiation.  If she has further questions please have Knox Saliva discuss with the patient.

## 2021-06-01 NOTE — Telephone Encounter (Signed)
CD is ready! 

## 2021-06-02 ENCOUNTER — Encounter: Payer: Self-pay | Admitting: Orthopedic Surgery

## 2021-06-13 ENCOUNTER — Encounter: Payer: Self-pay | Admitting: Orthopedic Surgery

## 2021-06-14 NOTE — Addendum Note (Signed)
Addended by: Cassandria Anger on: 06/14/2021 09:58 AM   Modules accepted: Orders

## 2021-06-14 NOTE — Telephone Encounter (Signed)
Called patient since she has not yet had Reclast scheduled. She spoke with Dr. Sharol Given, her orthopedist, and he recommended she hold Reclast until after her procedure.  Will cancel signed and held orders that were placed for [facility].  Knox Saliva, PharmD, MPH, BCPS Clinical Pharmacist (Rheumatology and Pulmonology)

## 2021-06-14 NOTE — Progress Notes (Signed)
Reclast orders discontinued for [facility]. Patient will plan to receive Reclast after her ortho surgery.  Knox Saliva, PharmD, MPH, BCPS Clinical Pharmacist (Rheumatology and Pulmonology)

## 2021-06-23 ENCOUNTER — Ambulatory Visit: Payer: Medicare Other | Admitting: Orthopedic Surgery

## 2021-07-04 ENCOUNTER — Encounter: Payer: Self-pay | Admitting: Orthopedic Surgery

## 2021-07-08 ENCOUNTER — Encounter: Payer: Self-pay | Admitting: Orthopedic Surgery

## 2021-07-10 ENCOUNTER — Encounter: Payer: Self-pay | Admitting: Orthopedic Surgery

## 2021-07-18 ENCOUNTER — Ambulatory Visit: Payer: Medicare Other | Admitting: Orthopedic Surgery

## 2021-07-25 NOTE — Progress Notes (Signed)
Surgical Instructions    Your procedure is scheduled on Friday, November 18th, 2022.   Report to [facility] Main Entrance "A" at 05:30 A.M., then check in with the Admitting office.  Call this number if you have problems the morning of surgery:  [phone]   If you have any questions prior to your surgery date call [phone]: Open Monday-Friday 8am-4pm    Remember:  Do not eat after midnight the night before your surgery  You may drink clear liquids until 04:30 the morning of your surgery.   Clear liquids allowed are: Water, Non-Citrus Juices (without pulp), Carbonated Beverages, Clear Tea, Black Coffee ONLY (NO MILK, CREAM OR POWDERED CREAMER of any kind), and Gatorade    Take these medicines the morning of surgery with A SIP OF WATER:  levothyroxine (SYNTHROID) predniSONE (DELTASONE) carboxymethylcellulose (REFRESH PLUS) - if needed  As of today, STOP taking any Aspirin (unless otherwise instructed by your surgeon) Aleve, Naproxen, Ibuprofen, Motrin, Advil, Goody's, BC's, all herbal medications, fish oil, and all vitamins.   After your COVID test   You are not required to quarantine however you are required to wear a well-fitting mask when you are out and around people not in your household.  If your mask becomes wet or soiled, replace with a new one.  Wash your hands often with soap and water for 20 seconds or clean your hands with an alcohol-based hand sanitizer that contains at least 60% alcohol.  Do not share personal items.  Notify your provider: if you are in close contact with someone who has COVID  or if you develop a fever of 100.4 or greater, sneezing, cough, sore throat, shortness of breath or body aches.    The day of surgery:          Do not wear jewelry or makeup Do not wear lotions, powders, perfumes, or deodorant. Do not shave 48 hours prior to surgery.   Do not bring valuables to the hospital. DO Not wear nail polish, gel polish, artificial nails,  or any other type of covering on natural nails including finger and toenails. If patients have artificial nails, gel coating, etc. that need to be removed by a nail salon, please have this removed prior to surgery or surgery may need to be canceled/delayed if the surgeon/ anesthesia feels like the patient is unable to be adequately monitored.              [facility] is not responsible for any belongings or valuables.  Do NOT Smoke (Tobacco/Vaping)  24 hours prior to your procedure  If you use a CPAP at night, you may bring your mask for your overnight stay.   Contacts, glasses, hearing aids, dentures or partials may not be worn into surgery, please bring cases for these belongings   For patients admitted to the hospital, discharge time will be determined by your treatment team.   Patients discharged the day of surgery will not be allowed to drive home, and someone needs to stay with them for 24 hours.  NO VISITORS WILL BE ALLOWED IN PRE-OP WHERE PATIENTS ARE PREPPED FOR SURGERY.  ONLY 1 SUPPORT PERSON MAY BE PRESENT IN THE WAITING ROOM WHILE YOU ARE IN SURGERY.  IF YOU ARE TO BE ADMITTED, ONCE YOU ARE IN YOUR ROOM YOU WILL BE ALLOWED TWO (2) VISITORS. 1 (ONE) VISITOR MAY STAY OVERNIGHT BUT MUST ARRIVE TO THE ROOM BY 8pm.  Minor children may have two parents present. Special consideration for safety and  communication needs will be reviewed on a case by case basis.  Special instructions:    Oral Hygiene is also important to reduce your risk of infection.  Remember - BRUSH YOUR TEETH THE MORNING OF SURGERY WITH YOUR REGULAR TOOTHPASTE   [place]- Preparing For Surgery  Before surgery, you can play an important role. Because skin is not sterile, your skin needs to be as free of germs as possible. You can reduce the number of germs on your skin by washing with CHG (chlorahexidine gluconate) Soap before surgery.  CHG is an antiseptic cleaner which kills germs and bonds with the skin to  continue killing germs even after washing.     Please do not use if you have an allergy to CHG or antibacterial soaps. If your skin becomes reddened/irritated stop using the CHG.  Do not shave (including legs and underarms) for at least 48 hours prior to first CHG shower. It is OK to shave your face.  Please follow these instructions carefully.     Shower the NIGHT BEFORE SURGERY and the MORNING OF SURGERY with CHG Soap.   If you chose to wash your hair, wash your hair first as usual with your normal shampoo. After you shampoo, rinse your hair and body thoroughly to remove the shampoo.  Then [employer] and genitals (private parts) with your normal soap and rinse thoroughly to remove soap.  After that Use CHG Soap as you would any other liquid soap. You can apply CHG directly to the skin and wash gently with a scrungie or a clean washcloth.   Apply the CHG Soap to your body ONLY FROM THE NECK DOWN.  Do not use on open wounds or open sores. Avoid contact with your eyes, ears, mouth and genitals (private parts). Wash Face and genitals (private parts)  with your normal soap.   Wash thoroughly, paying special attention to the area where your surgery will be performed.  Thoroughly rinse your body with warm water from the neck down.  DO NOT shower/wash with your normal soap after using and rinsing off the CHG Soap.  Pat yourself dry with a CLEAN TOWEL.  Wear CLEAN PAJAMAS to bed the night before surgery  Place CLEAN SHEETS on your bed the night before your surgery  DO NOT SLEEP WITH PETS.   Day of Surgery:  Take a shower with CHG soap. Wear Clean/Comfortable clothing the morning of surgery Do not apply any deodorants/lotions.   Remember to brush your teeth WITH YOUR REGULAR TOOTHPASTE.   Please read over the following fact sheets that you were given.

## 2021-07-26 ENCOUNTER — Other Ambulatory Visit: Payer: Self-pay

## 2021-07-26 ENCOUNTER — Encounter (HOSPITAL_COMMUNITY)
Admission: RE | Admit: 2021-07-26 | Discharge: 2021-07-26 | Disposition: A | Discharge status: Home / Self Care | Payer: Medicare Other | Source: Ambulatory Visit | Attending: Orthopedic Surgery | Admitting: Orthopedic Surgery

## 2021-07-26 ENCOUNTER — Encounter (HOSPITAL_COMMUNITY): Payer: Self-pay

## 2021-07-26 VITALS — BP 133/62 | HR 74 | Temp 97.8°F | Resp 18 | Ht 68.5 in | Wt 195.0 lb

## 2021-07-26 DIAGNOSIS — Z20822 Contact with and (suspected) exposure to covid-19: Secondary | ICD-10-CM | POA: Diagnosis not present

## 2021-07-26 DIAGNOSIS — Z01812 Encounter for preprocedural laboratory examination: Secondary | ICD-10-CM | POA: Diagnosis not present

## 2021-07-26 DIAGNOSIS — Z01818 Encounter for other preprocedural examination: Secondary | ICD-10-CM

## 2021-07-26 HISTORY — DX: Pneumonia, unspecified organism: J18.9

## 2021-07-26 LAB — CBC
HCT: 38.3 % (ref 36.0–46.0)
Hemoglobin: 12.5 g/dL (ref 12.0–15.0)
MCH: 31 pg (ref 26.0–34.0)
MCHC: 32.6 g/dL (ref 30.0–36.0)
MCV: 95 fL (ref 80.0–100.0)
Platelets: 220 10*3/uL (ref 150–400)
RBC: 4.03 MIL/uL (ref 3.87–5.11)
RDW: 12.9 % (ref 11.5–15.5)
WBC: 9.2 10*3/uL (ref 4.0–10.5)
nRBC: 0 % (ref 0.0–0.2)

## 2021-07-26 LAB — SARS CORONAVIRUS 2 (TAT 6-24 HRS): SARS Coronavirus 2: NEGATIVE

## 2021-07-26 LAB — COMPREHENSIVE METABOLIC PANEL
ALT: 24 U/L (ref 0–44)
AST: 29 U/L (ref 15–41)
Albumin: 3.9 g/dL (ref 3.5–5.0)
Alkaline Phosphatase: 99 U/L (ref 38–126)
Anion gap: 8 (ref 5–15)
BUN: 11 mg/dL (ref 8–23)
CO2: 26 mmol/L (ref 22–32)
Calcium: 9.3 mg/dL (ref 8.9–10.3)
Chloride: 103 mmol/L (ref 98–111)
Creatinine, Ser: 1.08 mg/dL — ABNORMAL HIGH (ref 0.44–1.00)
GFR, Estimated: 56 mL/min — ABNORMAL LOW (ref >60)
Glucose, Bld: 148 mg/dL — ABNORMAL HIGH (ref 70–99)
Potassium: 4.6 mmol/L (ref 3.5–5.1)
Sodium: 137 mmol/L (ref 135–145)
Total Bilirubin: 0.5 mg/dL (ref 0.3–1.2)
Total Protein: 6.5 g/dL (ref 6.5–8.1)

## 2021-07-26 NOTE — Progress Notes (Signed)
PCP - Sharilyn Sites, MD Cardiologist - denies  PPM/ICD - denies Device Orders - n/a Rep Notified - n/a  Chest x-ray - n/a EKG - n/a Stress Test - denies ECHO - 03/09/2017 Cardiac Cath - 2005 (CE)  Sleep Study - denies CPAP - n/a  Fasting Blood Sugar - n/a  Blood Thinner Instructions: n/a  Aspirin Instructions: Patient was instructed: As of today, STOP taking any Aspirin (unless otherwise instructed by your surgeon) Aleve, Naproxen, Ibuprofen, Motrin, Advil, Goody's, BC's, all herbal medications, fish oil, and all vitamins.  ERAS Protcol - yes PRE-SURGERY Ensure or G2- no  COVID TEST- done in PAT on 07/26/2021   Anesthesia review: no  Patient denies shortness of breath, fever, cough and chest pain at PAT appointment   All instructions explained to the patient, with a verbal understanding of the material. Patient agrees to go over the instructions while at home for a better understanding. Patient also instructed to self quarantine after being tested for COVID-19. The opportunity to ask questions was provided.

## 2021-07-28 NOTE — Anesthesia Preprocedure Evaluation (Addendum)
Anesthesia Evaluation  Patient identified by MRN, date of birth, ID band Patient awake    Reviewed: Allergy & Precautions, NPO status , Patient's Chart, lab work & pertinent test results  Airway Mallampati: I       Dental  (+) Teeth Intact, Dental Advisory Given   Pulmonary    Pulmonary exam normal        Cardiovascular negative cardio ROS Normal cardiovascular exam     Neuro/Psych Anxiety    GI/Hepatic negative GI ROS,   Endo/Other  Hypothyroidism   Renal/GU   negative genitourinary   Musculoskeletal  (+) Arthritis , Osteoarthritis,    Abdominal Normal abdominal exam  (+)   Peds  Hematology   Anesthesia Other Findings   Reproductive/Obstetrics                           Anesthesia Physical Anesthesia Plan  ASA: 2  Anesthesia Plan: General   Post-op Pain Management:    Induction: Intravenous  PONV Risk Score and Plan: Ondansetron, Dexamethasone and Midazolam  Airway Management Planned: LMA  Additional Equipment: None  Intra-op Plan:   Post-operative Plan: Extubation in OR  Informed Consent: I have reviewed the patients History and Physical, chart, labs and discussed the procedure including the risks, benefits and alternatives for the proposed anesthesia with the patient or authorized representative who has indicated his/her understanding and acceptance.     Dental advisory given  Plan Discussed with: CRNA  Anesthesia Plan Comments:        Anesthesia Quick Evaluation

## 2021-07-29 ENCOUNTER — Encounter (HOSPITAL_COMMUNITY): Payer: Self-pay | Admitting: Orthopedic Surgery

## 2021-07-29 ENCOUNTER — Ambulatory Visit (HOSPITAL_COMMUNITY)
Admission: RE | Admit: 2021-07-29 | Discharge: 2021-07-29 | Disposition: A | Discharge status: Home / Self Care | Payer: Medicare Other | Attending: Orthopedic Surgery | Admitting: Orthopedic Surgery

## 2021-07-29 ENCOUNTER — Encounter (HOSPITAL_COMMUNITY)
Admission: RE | Disposition: A | Discharge status: Home / Self Care | Payer: Self-pay | Source: Home / Self Care | Attending: Orthopedic Surgery

## 2021-07-29 ENCOUNTER — Ambulatory Visit (HOSPITAL_COMMUNITY): Payer: Medicare Other | Admitting: Anesthesiology

## 2021-07-29 ENCOUNTER — Other Ambulatory Visit: Payer: Self-pay

## 2021-07-29 DIAGNOSIS — T85848A Pain due to other internal prosthetic devices, implants and grafts, initial encounter: Secondary | ICD-10-CM

## 2021-07-29 DIAGNOSIS — M79675 Pain in left toe(s): Secondary | ICD-10-CM | POA: Insufficient documentation

## 2021-07-29 DIAGNOSIS — T85848S Pain due to other internal prosthetic devices, implants and grafts, sequela: Secondary | ICD-10-CM

## 2021-07-29 DIAGNOSIS — R7989 Other specified abnormal findings of blood chemistry: Secondary | ICD-10-CM

## 2021-07-29 DIAGNOSIS — Z981 Arthrodesis status: Secondary | ICD-10-CM | POA: Insufficient documentation

## 2021-07-29 DIAGNOSIS — X58XXXA Exposure to other specified factors, initial encounter: Secondary | ICD-10-CM | POA: Insufficient documentation

## 2021-07-29 DIAGNOSIS — T8484XA Pain due to internal orthopedic prosthetic devices, implants and grafts, initial encounter: Secondary | ICD-10-CM | POA: Insufficient documentation

## 2021-07-29 HISTORY — PX: FOOT ARTHRODESIS: SHX1655

## 2021-07-29 SURGERY — FUSION, JOINT, FOOT
Anesthesia: General | Site: Foot | Laterality: Left

## 2021-07-29 MED ORDER — LACTATED RINGERS IV SOLN: INTRAVENOUS | Status: DC

## 2021-07-29 MED ORDER — CEFAZOLIN SODIUM-DEXTROSE 2-4 GM/100ML-% IV SOLN
INTRAVENOUS | Status: AC
  Filled 2021-07-29: qty 100

## 2021-07-29 MED ORDER — CHLORHEXIDINE GLUCONATE 0.12 % MT SOLN: 15.0000 mL | Freq: Once | OROMUCOSAL | Status: AC

## 2021-07-29 MED ORDER — ONDANSETRON HCL 4 MG/2ML IJ SOLN
INTRAMUSCULAR | Status: DC | PRN
  Administered 2021-07-29: 4 mg via INTRAVENOUS

## 2021-07-29 MED ORDER — LIDOCAINE HCL (CARDIAC) PF 100 MG/5ML IV SOSY
PREFILLED_SYRINGE | INTRAVENOUS | Status: DC | PRN
  Administered 2021-07-29: 100 mg via INTRAVENOUS

## 2021-07-29 MED ORDER — MEPERIDINE HCL 25 MG/ML IJ SOLN: 6.2500 mg | INTRAMUSCULAR | Status: DC | PRN

## 2021-07-29 MED ORDER — CHLORHEXIDINE GLUCONATE 0.12 % MT SOLN
OROMUCOSAL | Status: AC
  Administered 2021-07-29: 15 mL via OROMUCOSAL
  Filled 2021-07-29: qty 15

## 2021-07-29 MED ORDER — 0.9 % SODIUM CHLORIDE (POUR BTL) OPTIME
TOPICAL | Status: DC | PRN
  Administered 2021-07-29: 1000 mL

## 2021-07-29 MED ORDER — DEXAMETHASONE SODIUM PHOSPHATE 4 MG/ML IJ SOLN
INTRAMUSCULAR | Status: DC | PRN
  Administered 2021-07-29: 8 mg via INTRAVENOUS

## 2021-07-29 MED ORDER — OXYCODONE HCL 5 MG PO TABS
5.0000 mg | ORAL_TABLET | ORAL | Status: DC | PRN
  Administered 2021-07-29: 5 mg via ORAL

## 2021-07-29 MED ORDER — FENTANYL CITRATE (PF) 100 MCG/2ML IJ SOLN
INTRAMUSCULAR | Status: AC
  Filled 2021-07-29: qty 2

## 2021-07-29 MED ORDER — CEFAZOLIN SODIUM-DEXTROSE 2-4 GM/100ML-% IV SOLN
2.0000 g | INTRAVENOUS | Status: AC
  Administered 2021-07-29: 2 g via INTRAVENOUS

## 2021-07-29 MED ORDER — PROPOFOL 10 MG/ML IV BOLUS
INTRAVENOUS | Status: DC | PRN
  Administered 2021-07-29: 200 mg via INTRAVENOUS

## 2021-07-29 MED ORDER — OXYCODONE HCL 5 MG PO TABS
ORAL_TABLET | ORAL | Status: AC
  Filled 2021-07-29: qty 1

## 2021-07-29 MED ORDER — CLONIDINE HCL (ANALGESIA) 100 MCG/ML EP SOLN
EPIDURAL | Status: DC | PRN
  Administered 2021-07-29: 100 ug

## 2021-07-29 MED ORDER — ACETAMINOPHEN 10 MG/ML IV SOLN
INTRAVENOUS | Status: AC
  Filled 2021-07-29: qty 100

## 2021-07-29 MED ORDER — OXYCODONE HCL 5 MG PO TABS: 5.0000 mg | ORAL_TABLET | ORAL | 0 refills | Status: DC | PRN

## 2021-07-29 MED ORDER — MIDAZOLAM HCL 2 MG/2ML IJ SOLN
INTRAMUSCULAR | Status: AC
  Filled 2021-07-29: qty 2

## 2021-07-29 MED ORDER — ACETAMINOPHEN 10 MG/ML IV SOLN: 1000.0000 mg | Freq: Once | INTRAVENOUS | Status: DC | PRN

## 2021-07-29 MED ORDER — MIDAZOLAM HCL 5 MG/5ML IJ SOLN
INTRAMUSCULAR | Status: DC | PRN
Start: 2021-07-29 — End: 2021-07-29
  Administered 2021-07-29: 2 mg via INTRAVENOUS

## 2021-07-29 MED ORDER — ROPIVACAINE HCL 7.5 MG/ML IJ SOLN
INTRAMUSCULAR | Status: DC | PRN
  Administered 2021-07-29 (×4): 5 mL via PERINEURAL

## 2021-07-29 MED ORDER — FENTANYL CITRATE (PF) 250 MCG/5ML IJ SOLN
INTRAMUSCULAR | Status: AC
  Filled 2021-07-29: qty 5

## 2021-07-29 MED ORDER — FENTANYL CITRATE (PF) 100 MCG/2ML IJ SOLN
25.0000 ug | INTRAMUSCULAR | Status: DC | PRN
  Administered 2021-07-29 (×2): 25 ug via INTRAVENOUS

## 2021-07-29 MED ORDER — FENTANYL CITRATE (PF) 100 MCG/2ML IJ SOLN
INTRAMUSCULAR | Status: DC | PRN
  Administered 2021-07-29: 150 ug via INTRAVENOUS
  Administered 2021-07-29: 100 ug via INTRAVENOUS

## 2021-07-29 MED ORDER — ONDANSETRON HCL 4 MG/2ML IJ SOLN: 4.0000 mg | Freq: Once | INTRAMUSCULAR | Status: DC | PRN

## 2021-07-29 MED ORDER — PHENYLEPHRINE HCL-NACL 20-0.9 MG/250ML-% IV SOLN
INTRAVENOUS | Status: DC | PRN
  Administered 2021-07-29: 50 ug/min via INTRAVENOUS

## 2021-07-29 MED ORDER — DEXAMETHASONE SODIUM PHOSPHATE 10 MG/ML IJ SOLN
INTRAMUSCULAR | Status: DC | PRN
  Administered 2021-07-29: 10 mg

## 2021-07-29 MED ORDER — ORAL CARE MOUTH RINSE: 15.0000 mL | Freq: Once | OROMUCOSAL | Status: AC

## 2021-07-29 SURGICAL SUPPLY — 51 items
BAG COUNTER SPONGE SURGICOUNT (BAG) ×2 IMPLANT
BAG SPNG CNTER NS LX DISP (BAG) ×1
BANDAGE ESMARK 6X9 LF (GAUZE/BANDAGES/DRESSINGS) IMPLANT
BIT DRILL SOLID 2.0 X 110MM (DRILL) IMPLANT
BLADE SAW SGTL HD 18.5X60.5X1. (BLADE) ×2 IMPLANT
BLADE SURG 10 STRL SS (BLADE) IMPLANT
BNDG CMPR 9X6 STRL LF SNTH (GAUZE/BANDAGES/DRESSINGS)
BNDG COHESIVE 4X5 TAN STRL (GAUZE/BANDAGES/DRESSINGS) ×2 IMPLANT
BNDG COHESIVE 6X5 TAN NS LF (GAUZE/BANDAGES/DRESSINGS) ×1 IMPLANT
BNDG ESMARK 6X9 LF (GAUZE/BANDAGES/DRESSINGS)
BNDG GAUZE ELAST 4 BULKY (GAUZE/BANDAGES/DRESSINGS) ×3 IMPLANT
COTTON STERILE ROLL (GAUZE/BANDAGES/DRESSINGS) ×2 IMPLANT
COVER MAYO STAND STRL (DRAPES) IMPLANT
COVER SURGICAL LIGHT HANDLE (MISCELLANEOUS) ×4 IMPLANT
DRAPE INCISE IOBAN 66X45 STRL (DRAPES) ×2 IMPLANT
DRAPE OEC MINIVIEW 54X84 (DRAPES) ×1 IMPLANT
DRAPE U-SHAPE 47X51 STRL (DRAPES) ×2 IMPLANT
DRILL SOLID 2.0 X 110MM (DRILL) ×2
DRSG ADAPTIC 3X8 NADH LF (GAUZE/BANDAGES/DRESSINGS) ×2 IMPLANT
DURAPREP 26ML APPLICATOR (WOUND CARE) ×2 IMPLANT
ELECT REM PT RETURN 9FT ADLT (ELECTROSURGICAL) ×2
ELECTRODE REM PT RTRN 9FT ADLT (ELECTROSURGICAL) ×1 IMPLANT
GAUZE SPONGE 4X4 12PLY STRL (GAUZE/BANDAGES/DRESSINGS) ×2 IMPLANT
GLOVE SURG ORTHO LTX SZ9 (GLOVE) ×2 IMPLANT
GLOVE SURG UNDER POLY LF SZ9 (GLOVE) ×2 IMPLANT
GOWN STRL REUS W/ TWL XL LVL3 (GOWN DISPOSABLE) ×3 IMPLANT
GOWN STRL REUS W/TWL XL LVL3 (GOWN DISPOSABLE) ×6
K-WIRE SMOOTH 1.6X150MM (WIRE) ×2
KIT BASIN OR (CUSTOM PROCEDURE TRAY) ×2 IMPLANT
KIT TURNOVER KIT B (KITS) ×2 IMPLANT
KWIRE SMOOTH 1.6X150MM (WIRE) IMPLANT
MANIFOLD NEPTUNE II (INSTRUMENTS) ×2 IMPLANT
NS IRRIG 1000ML POUR BTL (IV SOLUTION) ×2 IMPLANT
PACK ORTHO EXTREMITY (CUSTOM PROCEDURE TRAY) ×2 IMPLANT
PAD ARMBOARD 7.5X6 YLW CONV (MISCELLANEOUS) ×4 IMPLANT
PAD CAST 4YDX4 CTTN HI CHSV (CAST SUPPLIES) ×1 IMPLANT
PADDING CAST COTTON 4X4 STRL (CAST SUPPLIES) ×2
PLATE ANGLED MTP LONG LT 10D (Plate) ×1 IMPLANT
PUTTY DBX 2.5CC (Putty) ×2 IMPLANT
PUTTY DBX 2.5CC DEPUY (Putty) IMPLANT
SCREW LOCK PLATE R3 2.7X12 (Screw) ×3 IMPLANT
SCREW LOCK PLATE R3 2.7X14 (Screw) ×2 IMPLANT
SCREW NON LOCKING PLATE 2.7X14 (Screw) ×1 IMPLANT
SPONGE T-LAP 18X18 ~~LOC~~+RFID (SPONGE) ×2 IMPLANT
SUCTION FRAZIER HANDLE 10FR (MISCELLANEOUS) ×2
SUCTION TUBE FRAZIER 10FR DISP (MISCELLANEOUS) ×1 IMPLANT
SUT ETHILON 2 0 PSLX (SUTURE) ×6 IMPLANT
TOWEL GREEN STERILE (TOWEL DISPOSABLE) ×2 IMPLANT
TOWEL GREEN STERILE FF (TOWEL DISPOSABLE) ×2 IMPLANT
TUBE CONNECTING 12X1/4 (SUCTIONS) ×2 IMPLANT
WATER STERILE IRR 1000ML POUR (IV SOLUTION) ×2 IMPLANT

## 2021-07-29 NOTE — Anesthesia Procedure Notes (Signed)
Procedure Name: LMA Insertion Date/Time: 07/29/2021 8:00 AM Performed by: Minerva Ends, CRNA Pre-anesthesia Checklist: Patient identified, Emergency Drugs available, Suction available and Patient being monitored Patient Re-evaluated:Patient Re-evaluated prior to induction Oxygen Delivery Method: Circle system utilized Preoxygenation: Pre-oxygenation with 100% oxygen Induction Type: IV induction Ventilation: Mask ventilation without difficulty LMA: LMA inserted LMA Size: 4.0 Tube type: Oral Number of attempts: 1 Placement Confirmation: positive ETCO2 and breath sounds checked- equal and bilateral Tube secured with: Tape Dental Injury: Teeth and Oropharynx as per pre-operative assessment

## 2021-07-29 NOTE — H&P (Signed)
Kimberly Reyes is an 65 y.o. female.   Chief Complaint: Pain left great toe MTP joint. HPI: Patient is a 65 year old woman who is status post fusion left great toe MTP joint for painful hallux rigidus.  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  Due to failure conservative treatment patient wishes initiated with revision fusion.  Past Medical History:  Diagnosis Date   Anxiety    Arthritis    Asthma   40 yrs ago   not current probem no inhaler use   Autoimmune hepatitis ([place])    Headache(784.0)    Hypothyroidism    Pneumonia     Past Surgical History:  Procedure Laterality Date   ABDOMINAL HYSTERECTOMY     20 years ago, complete   ARTHRODESIS METATARSALPHALANGEAL JOINT (MTPJ) Left 01/18/2021   Procedure: LEFT FOOT FIRST METATARSAL PHALANGEAL ARTHRODESIS AND EXCISION OF LEFT ANKLE LIPOMA;  Surgeon: Newt Minion, MD;  Location: [facility];  Service: Orthopedics;  Laterality: Left;   CARDIAC CATHETERIZATION     CHOLECYSTECTOMY N/A 11/03/2014   Procedure: LAPAROSCOPIC CHOLECYSTECTOMY WITH INTRAOPERATIVE CHOLANGIOGRAM;  Surgeon: Gayland Curry, MD;  [facility];  Service: General;  Laterality: N/A;   COLONOSCOPY N/A 03/27/2013   Procedure: COLONOSCOPY;  Surgeon: Rogene Houston, MD;  [facility];  Service: Endoscopy;  Laterality: N/A;  1030   COLONOSCOPY N/A 06/05/2018   Procedure: COLONOSCOPY;  Surgeon: Rogene Houston, MD;  [facility];  Service: Endoscopy;  Laterality: N/A;  1025   EYE SURGERY     KNEE ARTHROSCOPY W/ MENISCAL REPAIR Right    TUBAL LIGATION     WEIL OSTEOTOMY Left 01/18/2021   Procedure: WEIL OSTEOTOMY LEFT 2ND AND 3RD METATARSAL;  Surgeon: Newt Minion, MD;  Location: [facility];  Service: Orthopedics;  Laterality: Left;    Family History  Problem Relation Age of Onset   Colon cancer Father    CVA Mother    Pneumonia Brother    Cancer Sister        breast,  thyroid    Heart disease Sister    Healthy Son    Social History:  reports that she has never smoked. She has never used smokeless tobacco. She reports that she does not currently use alcohol. She reports that she does not use drugs.  Allergies: No Known Allergies  Medications Prior to Admission  Medication Sig Dispense Refill   Ascorbic Acid (VITAMIN C WITH ROSE HIPS) 1000 MG tablet Take 1,000 mg by mouth daily.     azaTHIOprine (IMURAN) 50 MG tablet Take 50 mg by mouth at bedtime.      Calcium-Magnesium-Vitamin D (CALCIUM 1200+D3 PO) Take 1 tablet by mouth at bedtime.     carboxymethylcellulose (REFRESH PLUS) 0.5 % SOLN Place 1 drop into both eyes 3 (three) times daily as needed (dry eyes).     Cinnamon 500 MG TABS Take 500 mg by mouth daily.     Cyanocobalamin (VITAMIN B-12) 5000 MCG SUBL Place 5,000 mcg under the tongue at bedtime.      folic acid (FOLVITE) 1 MG tablet Take 1 mg by mouth daily.     Glucosamine HCl (GLUCOSAMINE PO) Take 1 tablet by mouth daily.     L-Theanine 100 MG CAPS Take 100 mg by mouth daily.     levothyroxine (SYNTHROID) 100 MCG tablet Take 100 mcg by mouth daily before breakfast.     MAGNESIUM CITRATE PO Take 100  mg by mouth daily.     Multiple Vitamin (MULTIVITAMIN) tablet Take 1 tablet by mouth every evening.      Multiple Vitamins-Minerals (HAIR/SKIN/NAILS/BIOTIN PO) Take 3 each by mouth daily.     OVER THE COUNTER MEDICATION Take 2 tablets by mouth daily. Focus factor     predniSONE (DELTASONE) 5 MG tablet Take 5 mg by mouth daily after breakfast.      Probiotic Product (PROBIOTIC DAILY PO) Take 1 capsule by mouth daily.     Turmeric Curcumin 500 MG CAPS Take 500 mg by mouth every evening.     zolpidem (AMBIEN) 10 MG tablet Take 5 mg by mouth at bedtime as needed for sleep.      No results found for this or any previous visit (from the past 48 hour(s)). No results found.  Review of Systems  All other systems reviewed and are negative.  Blood  pressure (!) 149/78, pulse 70, temperature 97.6 F (36.4 C), temperature source Oral, resp. rate 17, height 5' 8.5" (1.74 m), weight 86.2 kg, SpO2 100 %. Physical Exam  Examination patient is alert oriented no adenopathy well-dressed normal affect normal respiratory effort.  She has a palpable dorsalis pedis pulse.  She has pain with attempted range of motion of the MTP joint.  There is prominent hardware.  Review of the radiographs shows failure of internal fixation with broken hardware and a fibrous nonunion of the MTP joint. Assessment/Plan Assessment: Failure of internal fixation fusion of the left great toe MTP joint.  Plan: Plan for takedown of the nonunion removal of deep retained hardware revision fusion left great toe MTP joint.  Newt Minion, MD 07/29/2021, 6:53 AM

## 2021-07-29 NOTE — Transfer of Care (Signed)
Immediate Anesthesia Transfer of Care Note  Patient: Kimberly Reyes  Procedure(s) Performed: REMOVAL HARDWARE AND REVISION FUSION LEFT GREAT TOE METATARSOPHALANGEAL JOINT (Left: Foot)  Patient Location: PACU  Anesthesia Type:General  Level of Consciousness: awake, alert  and oriented  Airway & Oxygen Therapy: Patient Spontanous Breathing  Post-op Assessment: Report given to RN and Post -op Vital signs reviewed and stable  Post vital signs: Reviewed and stable  Last Vitals:  Vitals Value Taken Time  BP 100/66 07/29/21 0845  Temp 36.4 C 07/29/21 0845  Pulse 72 07/29/21 0848  Resp 7 07/29/21 0848  SpO2 95 % 07/29/21 0848  Vitals shown include unvalidated device data.  Last Pain:  Vitals:   07/29/21 0615  TempSrc: Oral  PainSc:          Complications: No notable events documented.

## 2021-07-29 NOTE — Anesthesia Postprocedure Evaluation (Signed)
Anesthesia Post Note  Patient: Kimberly Reyes  Procedure(s) Performed: REMOVAL HARDWARE AND REVISION FUSION LEFT GREAT TOE METATARSOPHALANGEAL JOINT (Left: Foot)     Patient location during evaluation: Phase II Anesthesia Type: General Level of consciousness: awake Pain management: pain level controlled Vital Signs Assessment: post-procedure vital signs reviewed and stable Respiratory status: spontaneous breathing Cardiovascular status: stable Postop Assessment: no apparent nausea or vomiting Anesthetic complications: no   No notable events documented.  Last Vitals:  Vitals:   07/29/21 0915 07/29/21 0930  BP: (!) 101/56 (!) 104/51  Pulse: 64 62  Resp: 16 16  Temp:  36.4 C  SpO2: 94% 93%    Last Pain:  Vitals:   07/29/21 0930  TempSrc:   PainSc: 2                  [name]

## 2021-07-29 NOTE — Op Note (Signed)
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  POST-OPERATIVE DIAGNOSIS:  Same  PROCEDURE:  REMOVAL HARDWARE AND REVISION FUSION LEFT GREAT TOE METATARSOPHALANGEAL JOINT  SURGEON:  Newt Minion, MD  PHYSICIAN ASSISTANT:None ANESTHESIA:   General  PREOPERATIVE INDICATIONS:  Kimberly Reyes is a  65 y.o. female with a diagnosis of Fibrous Union Left Great Toe Metatarsophalangeal Joint Fusion who failed conservative measures and elected for surgical management.    The risks benefits and alternatives were discussed with the patient preoperatively including but not limited to the risks of infection, bleeding, nerve injury, cardiopulmonary complications, the need for revision surgery, among others, and the patient was willing to proceed.  OPERATIVE IMPLANTS: Large 10 degree Paragon fusion plate with 2 cc of DBX graft.  @ENCIMAGES @  OPERATIVE FINDINGS: Post reduction findings show stable alignment.  OPERATIVE PROCEDURE: Patient was brought the operating room underwent a regional block and underwent a general anesthetic.  After adequate levels anesthesia obtained patient's left lower extremity was prepped using DuraPrep draped into a sterile field a timeout was called.  Her previous medial incision was used this was carried down to the deep retained hardware the osteotome was used to debride the fibrinous tissue off the broken deep retained hardware.  The screws and plate were removed without complications.  Using the cup and cone reamers the cup reamer was used on the first metatarsal head 17 mm in diameter and the 17 mm diameter cone reamer was used on the base of the proximal phalanx.  The joint was reduced and a large dorsal plate was applied this was secured proximally with a compression screw distally with a locking screw the screw was then loosened the  fusion site compressed and the proximal screw tightened.  The remaining screws were placed with total of 3 screws proximally and 3 screws distally with locking screws.  The bone had stable alignment.  There was further DBX graft placed at the fusion site.  The incision was closed using 2-0 nylon a sterile dressing was applied patient was taken the PACU in stable condition.   DISCHARGE PLANNING:  Antibiotic duration: Preoperative antibiotics  Weightbearing: Ideally nonweightbearing on the left  Pain medication: Prescription for oxycodone  Dressing care/ Wound VAC: Follow-up in 1 week to change the dressing  Ambulatory devices: Kneeling scooter  Discharge to: Home.  Follow-up: In the office 1 week post operative.

## 2021-07-29 NOTE — Anesthesia Procedure Notes (Signed)
Anesthesia Regional Block: Popliteal block   Pre-Anesthetic Checklist: , timeout performed,  Correct Patient, Correct Site, Correct Laterality,  Correct Procedure, Correct Position, site marked,  Risks and benefits discussed,  Surgical consent,  Pre-op evaluation,  At surgeon's request and post-op pain management  Laterality: Lower and Left  Prep: chloraprep       Needles:  Injection technique: Single-shot  Needle Type: Echogenic Stimulator Needle     Needle Length: 9cm  Needle Gauge: 20   Needle insertion depth: 2 cm   Additional Needles:   Procedures:,,,, ultrasound used (permanent image in chart),,    Narrative:  Start time: 07/29/2021 7:20 AM End time: 07/29/2021 7:30 AM Injection made incrementally with aspirations every 5 mL.  Performed by: Personally  Anesthesiologist: Lyn Hollingshead, MD

## 2021-08-01 ENCOUNTER — Other Ambulatory Visit: Payer: Self-pay | Admitting: Orthopedic Surgery

## 2021-08-01 ENCOUNTER — Telehealth: Payer: Self-pay | Admitting: Orthopedic Surgery

## 2021-08-01 MED ORDER — OXYCODONE HCL 5 MG PO TABS: 5.0000 mg | ORAL_TABLET | ORAL | 0 refills | Status: DC | PRN

## 2021-08-01 NOTE — Telephone Encounter (Signed)
Pt is s/p a GT revision fusion 07/29/21 she is asking for refill of pain medication because she is afraid that she will run out over the holiday weekend.

## 2021-08-01 NOTE — Telephone Encounter (Signed)
Pt calling asking for a refill on her pain medication. The best pharmavy is [facility], [place] and the best call back number is [phone].

## 2021-08-02 ENCOUNTER — Encounter (HOSPITAL_COMMUNITY): Payer: Self-pay | Admitting: Orthopedic Surgery

## 2021-08-11 ENCOUNTER — Ambulatory Visit (INDEPENDENT_AMBULATORY_CARE_PROVIDER_SITE_OTHER): Payer: Medicare Other | Admitting: Orthopedic Surgery

## 2021-08-11 ENCOUNTER — Ambulatory Visit (INDEPENDENT_AMBULATORY_CARE_PROVIDER_SITE_OTHER): Payer: Medicare Other

## 2021-08-11 ENCOUNTER — Encounter: Payer: Self-pay | Admitting: Orthopedic Surgery

## 2021-08-11 DIAGNOSIS — M2022 Hallux rigidus, left foot: Secondary | ICD-10-CM

## 2021-08-11 DIAGNOSIS — M9689 Other intraoperative and postprocedural complications and disorders of the musculoskeletal system: Secondary | ICD-10-CM

## 2021-08-11 NOTE — Progress Notes (Signed)
Office Visit Note   Patient: Kimberly Reyes           Date of Birth: May 23, 1956           MRN: [id number] Visit Date: 08/11/2021              Requested by: Sharilyn Sites, MD [address] [place],  [place] [address] PCP: Sharilyn Sites, MD  Chief Complaint  Patient presents with   Left Foot - Routine Post Op    07/29/21 removal of HDW and revision of MTP fusion       HPI: Patient is a 65 year old woman who presents 2 weeks status post removal of deep retained hardware revision fusion great toe MTP joint left foot.  Patient is currently on a kneeling scooter patient has no concerns states she feels well.  Assessment & Plan: Visit Diagnoses:  1. Hallux rigidus, left foot   2. Nonunion of osteotomy site     Plan: Patient may begin Dial soap cleansing dry dressing changes daily continue with the fracture boot minimize weightbearing on the left.  Follow-Up Instructions: Return in about 1 week (around 08/18/2021).   Ortho Exam  Patient is alert, oriented, no adenopathy, well-dressed, normal affect, normal respiratory effort. Examination the incision is healing well it is well approximated there is no cellulitis no drainage there is minimal swelling.  Imaging: XR Toe Great Left  Result Date: 08/11/2021 2 view radiographs of the left great toe shows stable internal fixation no hardware complications.  No images are attached to the encounter.  Labs: Lab Results  Component Value Date   ESRSEDRATE 37 (H) 03/01/2017     Lab Results  Component Value Date   ALBUMIN 3.9 07/26/2021   ALBUMIN 3.8 01/14/2021   ALBUMIN 3.7 03/13/2017    No results found for: MG Lab Results  Component Value Date   VD25OH 57 05/26/2021    No results found for: PREALBUMIN CBC EXTENDED Latest Ref Rng & Units 07/26/2021 05/26/2021 07/20/2017  WBC 4.0 - 10.5 K/uL 9.2 7.8 8.4  RBC 3.87 - 5.11 MIL/uL 4.03 4.40 3.89  HGB 12.0 - 15.0 g/dL 12.5 13.3 11.1(L)  HCT 36.0 - 46.0 % 38.3 41.2 34.7(L)   PLT 150 - 400 K/uL 220 206 193  NEUTROABS 1,500 - 7,800 cells/uL - 4,789 -  LYMPHSABS 850 - 3,900 cells/uL - 1,810 -     There is no height or weight on file to calculate BMI.  Orders:  Orders Placed This Encounter  Procedures   XR Toe Great Left   No orders of the defined types were placed in this encounter.    Procedures: No procedures performed  Clinical Data: No additional findings.  ROS:  All other systems negative, except as noted in the HPI. Review of Systems  Objective: Vital Signs: There were no vitals taken for this visit.  Specialty Comments:  No specialty comments available.  PMFS History: Patient Active Problem List   Diagnosis Date Noted   Pain from implanted hardware    Hallux rigidus, left foot    Claw toe, acquired, left    Mass of left foot    Family hx of colon cancer 03/29/2018   Elevated LFTs 08/06/2017   Primary osteoarthritis of right hip 08/06/2017   Primary osteoarthritis of both feet 08/06/2017   Primary osteoarthritis of both hands 08/06/2017   Myofascial pain 08/06/2017   Hypothyroid 11/25/2014   Hypothyroidism 11/25/2014   Past Medical History:  Diagnosis Date   Anxiety  Arthritis    Asthma   40 yrs ago   not current probem no inhaler use   Autoimmune hepatitis ([place])    Headache(784.0)    Hypothyroidism    Pneumonia     Family History  Problem Relation Age of Onset   Colon cancer Father    CVA Mother    Pneumonia Brother    Cancer Sister        breast, thyroid    Heart disease Sister    Healthy Son     Past Surgical History:  Procedure Laterality Date   ABDOMINAL HYSTERECTOMY     20 years ago, complete   ARTHRODESIS METATARSALPHALANGEAL JOINT (MTPJ) Left 01/18/2021   Procedure: LEFT FOOT FIRST METATARSAL PHALANGEAL ARTHRODESIS AND EXCISION OF LEFT ANKLE LIPOMA;  Surgeon: Newt Minion, MD;  Location: [facility];  Service: Orthopedics;  Laterality: Left;   CARDIAC CATHETERIZATION      CHOLECYSTECTOMY N/A 11/03/2014   Procedure: LAPAROSCOPIC CHOLECYSTECTOMY WITH INTRAOPERATIVE CHOLANGIOGRAM;  Surgeon: Gayland Curry, MD;  [facility];  Service: General;  Laterality: N/A;   COLONOSCOPY N/A 03/27/2013   Procedure: COLONOSCOPY;  Surgeon: Rogene Houston, MD;  [facility];  Service: Endoscopy;  Laterality: N/A;  1030   COLONOSCOPY N/A 06/05/2018   Procedure: COLONOSCOPY;  Surgeon: Rogene Houston, MD;  [facility];  Service: Endoscopy;  Laterality: N/A;  1025   EYE SURGERY     FOOT ARTHRODESIS Left 07/29/2021   Procedure: REMOVAL HARDWARE AND REVISION FUSION LEFT GREAT TOE METATARSOPHALANGEAL JOINT;  Surgeon: Newt Minion, MD;  Location: [facility];  Service: Orthopedics;  Laterality: Left;   KNEE ARTHROSCOPY W/ MENISCAL REPAIR Right    TUBAL LIGATION     WEIL OSTEOTOMY Left 01/18/2021   Procedure: WEIL OSTEOTOMY LEFT 2ND AND 3RD METATARSAL;  Surgeon: Newt Minion, MD;  Location: [facility];  Service: Orthopedics;  Laterality: Left;   Social History   Occupational History   Not on file  Tobacco Use   Smoking status: Never   Smokeless tobacco: Never  Vaping Use   Vaping Use: Never used  Substance and Sexual Activity   Alcohol use: Not Currently   Drug use: Never   Sexual activity: Not on file

## 2021-08-18 ENCOUNTER — Ambulatory Visit (INDEPENDENT_AMBULATORY_CARE_PROVIDER_SITE_OTHER): Payer: Medicare Other | Admitting: Orthopedic Surgery

## 2021-08-18 ENCOUNTER — Other Ambulatory Visit: Payer: Self-pay

## 2021-08-18 DIAGNOSIS — M2022 Hallux rigidus, left foot: Secondary | ICD-10-CM

## 2021-08-18 DIAGNOSIS — M9689 Other intraoperative and postprocedural complications and disorders of the musculoskeletal system: Secondary | ICD-10-CM

## 2021-08-23 ENCOUNTER — Encounter: Payer: Self-pay | Admitting: Orthopedic Surgery

## 2021-08-23 NOTE — Progress Notes (Signed)
Patient is a 65 year old woman who presents 3 weeks status post removal of failed retained hardware from an MTP fusion with revision fusion.  She states she feels better.  She is concerned she started to have some overlapping over the third and fourth toes.  Incisions well-healed stitches harvested today she was given some thin pads to place between her toes she will continue using the scooter and fracture boot.

## 2021-09-19 ENCOUNTER — Other Ambulatory Visit: Payer: Self-pay

## 2021-09-19 ENCOUNTER — Ambulatory Visit (INDEPENDENT_AMBULATORY_CARE_PROVIDER_SITE_OTHER): Payer: Medicare Other | Admitting: Orthopedic Surgery

## 2021-09-19 ENCOUNTER — Encounter: Payer: Self-pay | Admitting: Orthopedic Surgery

## 2021-09-19 DIAGNOSIS — M9689 Other intraoperative and postprocedural complications and disorders of the musculoskeletal system: Secondary | ICD-10-CM

## 2021-09-19 NOTE — Progress Notes (Signed)
Office Visit Note   Patient: Kimberly Reyes           Date of Birth: 1956/03/22           MRN: [id number] Visit Date: 09/19/2021              Requested by: Sharilyn Sites, MD [address] [place],  [place] [address] PCP: Sharilyn Sites, MD  Chief Complaint  Patient presents with   Left Foot - Routine Post Op    07/29/21 removal HDW and revision fusion left GT MTP joint       HPI: Patient is a 66 year old woman who presents about 7 weeks status post revision fusion left great toe MTP joint.  Patient is currently in a fracture boot not weightbearing using a kneeling scooter.  Patient denies any pain states that her foot feels much better than before.  Assessment & Plan: Visit Diagnoses:  1. Nonunion of osteotomy site     Plan: Patient will begin progressive weightbearing in the fracture boot at follow-up we will obtain three-view radiographs of the left foot and anticipate advancing to regular shoewear.  Follow-Up Instructions: Return if symptoms worsen or fail to improve.   Ortho Exam  Patient is alert, oriented, no adenopathy, well-dressed, normal affect, normal respiratory effort. Examination of the foot has minimal swelling the alignment of her toes are good no redness no cellulitis.  The fusion site is clinically stable.  Imaging: No results found. No images are attached to the encounter.  Labs: Lab Results  Component Value Date   ESRSEDRATE 37 (H) 03/01/2017     Lab Results  Component Value Date   ALBUMIN 3.9 07/26/2021   ALBUMIN 3.8 01/14/2021   ALBUMIN 3.7 03/13/2017    No results found for: MG Lab Results  Component Value Date   VD25OH 57 05/26/2021    No results found for: PREALBUMIN CBC EXTENDED Latest Ref Rng & Units 07/26/2021 05/26/2021 07/20/2017  WBC 4.0 - 10.5 K/uL 9.2 7.8 8.4  RBC 3.87 - 5.11 MIL/uL 4.03 4.40 3.89  HGB 12.0 - 15.0 g/dL 12.5 13.3 11.1(L)  HCT 36.0 - 46.0 % 38.3 41.2 34.7(L)  PLT 150 - 400 K/uL 220 206 193  NEUTROABS  1,500 - 7,800 cells/uL - 4,789 -  LYMPHSABS 850 - 3,900 cells/uL - 1,810 -     There is no height or weight on file to calculate BMI.  Orders:  No orders of the defined types were placed in this encounter.  No orders of the defined types were placed in this encounter.    Procedures: No procedures performed  Clinical Data: No additional findings.  ROS:  All other systems negative, except as noted in the HPI. Review of Systems  Objective: Vital Signs: There were no vitals taken for this visit.  Specialty Comments:  No specialty comments available.  PMFS History: Patient Active Problem List   Diagnosis Date Noted   Pain from implanted hardware    Hallux rigidus, left foot    Claw toe, acquired, left    Mass of left foot    Family hx of colon cancer 03/29/2018   Elevated LFTs 08/06/2017   Primary osteoarthritis of right hip 08/06/2017   Primary osteoarthritis of both feet 08/06/2017   Primary osteoarthritis of both hands 08/06/2017   Myofascial pain 08/06/2017   Hypothyroid 11/25/2014   Hypothyroidism 11/25/2014   Past Medical History:  Diagnosis Date   Anxiety    Arthritis    Asthma   51  yrs ago   not current probem no inhaler use   Autoimmune hepatitis ([place])    Headache(784.0)    Hypothyroidism    Pneumonia     Family History  Problem Relation Age of Onset   Colon cancer Father    CVA Mother    Pneumonia Brother    Cancer Sister        breast, thyroid    Heart disease Sister    Healthy Son     Past Surgical History:  Procedure Laterality Date   ABDOMINAL HYSTERECTOMY     20 years ago, complete   ARTHRODESIS METATARSALPHALANGEAL JOINT (MTPJ) Left 01/18/2021   Procedure: LEFT FOOT FIRST METATARSAL PHALANGEAL ARTHRODESIS AND EXCISION OF LEFT ANKLE LIPOMA;  Surgeon: Newt Minion, MD;  Location: [facility];  Service: Orthopedics;  Laterality: Left;   CARDIAC CATHETERIZATION     CHOLECYSTECTOMY N/A 11/03/2014   Procedure:  LAPAROSCOPIC CHOLECYSTECTOMY WITH INTRAOPERATIVE CHOLANGIOGRAM;  Surgeon: Gayland Curry, MD;  [facility];  Service: General;  Laterality: N/A;   COLONOSCOPY N/A 03/27/2013   Procedure: COLONOSCOPY;  Surgeon: Rogene Houston, MD;  [facility];  Service: Endoscopy;  Laterality: N/A;  1030   COLONOSCOPY N/A 06/05/2018   Procedure: COLONOSCOPY;  Surgeon: Rogene Houston, MD;  [facility];  Service: Endoscopy;  Laterality: N/A;  1025   EYE SURGERY     FOOT ARTHRODESIS Left 07/29/2021   Procedure: REMOVAL HARDWARE AND REVISION FUSION LEFT GREAT TOE METATARSOPHALANGEAL JOINT;  Surgeon: Newt Minion, MD;  Location: [facility];  Service: Orthopedics;  Laterality: Left;   KNEE ARTHROSCOPY W/ MENISCAL REPAIR Right    TUBAL LIGATION     WEIL OSTEOTOMY Left 01/18/2021   Procedure: WEIL OSTEOTOMY LEFT 2ND AND 3RD METATARSAL;  Surgeon: Newt Minion, MD;  Location: [facility];  Service: Orthopedics;  Laterality: Left;   Social History   Occupational History   Not on file  Tobacco Use   Smoking status: Never   Smokeless tobacco: Never  Vaping Use   Vaping Use: Never used  Substance and Sexual Activity   Alcohol use: Not Currently   Drug use: Never   Sexual activity: Not on file

## 2021-10-13 ENCOUNTER — Ambulatory Visit (INDEPENDENT_AMBULATORY_CARE_PROVIDER_SITE_OTHER): Payer: Medicare Other | Admitting: Orthopedic Surgery

## 2021-10-13 ENCOUNTER — Other Ambulatory Visit: Payer: Self-pay

## 2021-10-13 ENCOUNTER — Ambulatory Visit (INDEPENDENT_AMBULATORY_CARE_PROVIDER_SITE_OTHER): Payer: Medicare Other

## 2021-10-13 DIAGNOSIS — M9689 Other intraoperative and postprocedural complications and disorders of the musculoskeletal system: Secondary | ICD-10-CM

## 2021-10-16 ENCOUNTER — Encounter: Payer: Self-pay | Admitting: Orthopedic Surgery

## 2021-10-16 NOTE — Progress Notes (Signed)
Office Visit Note   Patient: Kimberly Reyes           Date of Birth: 07/15/1956           MRN: [id number] Visit Date: 10/13/2021              Requested by: Sharilyn Sites, [address],  [place] [id number] PCP: Sharilyn Sites, MD  Chief Complaint  Patient presents with   Left Foot - Routine Post Op    07/29/21 removal of HDW and revision fusion left GT MTP joint      HPI: Patient is a 66 year old woman status post revision fusion left great toe MTP joint she is 10 weeks out.  She states that she has no pain third toe is overlapping the second toe.  Assessment & Plan: Visit Diagnoses:  1. Nonunion of osteotomy site     Plan: Patient will advance to regular shoewear with a carbon plate.  Follow-Up Instructions: Return if symptoms worsen or fail to improve.   Ortho Exam  Patient is alert, oriented, no adenopathy, well-dressed, normal affect, normal respiratory effort. Examination she has good dorsalis pedis pulse the incision is well-healed the bone is no pain with attempted distraction.  Imaging: No results found. No images are attached to the encounter.  Labs: Lab Results  Component Value Date   ESRSEDRATE 37 (H) 03/01/2017     Lab Results  Component Value Date   ALBUMIN 3.9 07/26/2021   ALBUMIN 3.8 01/14/2021   ALBUMIN 3.7 03/13/2017    No results found for: MG Lab Results  Component Value Date   VD25OH 57 05/26/2021    No results found for: PREALBUMIN CBC EXTENDED Latest Ref Rng & Units 07/26/2021 05/26/2021 07/20/2017  WBC 4.0 - 10.5 K/uL 9.2 7.8 8.4  RBC 3.87 - 5.11 MIL/uL 4.03 4.40 3.89  HGB 12.0 - 15.0 g/dL 12.5 13.3 11.1(L)  HCT 36.0 - 46.0 % 38.3 41.2 34.7(L)  PLT 150 - 400 K/uL 220 206 193  NEUTROABS 1,500 - 7,800 cells/uL - 4,789 -  LYMPHSABS 850 - 3,900 cells/uL - 1,810 -     There is no height or weight on file to calculate BMI.  Orders:  Orders Placed This Encounter  Procedures   XR Foot Complete Left   No orders  of the defined types were placed in this encounter.    Procedures: No procedures performed  Clinical Data: No additional findings.  ROS:  All other systems negative, except as noted in the HPI. Review of Systems  Objective: Vital Signs: There were no vitals taken for this visit.  Specialty Comments:  No specialty comments available.  PMFS History: Patient Active Problem List   Diagnosis Date Noted   Pain from implanted hardware    Hallux rigidus, left foot    Claw toe, acquired, left    Mass of left foot    Family hx of colon cancer 03/29/2018   Elevated LFTs 08/06/2017   Primary osteoarthritis of right hip 08/06/2017   Primary osteoarthritis of both feet 08/06/2017   Primary osteoarthritis of both hands 08/06/2017   Myofascial pain 08/06/2017   Hypothyroid 11/25/2014   Hypothyroidism 11/25/2014   Past Medical History:  Diagnosis Date   Anxiety    Arthritis    Asthma   40 yrs ago   not current probem no inhaler use   Autoimmune hepatitis ([place])    Headache(784.0)    Hypothyroidism    Pneumonia     Family History  Problem Relation Age of Onset   Colon cancer Father    CVA Mother    Pneumonia Brother    Cancer Sister        breast, thyroid    Heart disease Sister    Healthy Son     Past Surgical History:  Procedure Laterality Date   ABDOMINAL HYSTERECTOMY     20 years ago, complete   ARTHRODESIS METATARSALPHALANGEAL JOINT (MTPJ) Left 01/18/2021   Procedure: LEFT FOOT FIRST METATARSAL PHALANGEAL ARTHRODESIS AND EXCISION OF LEFT ANKLE LIPOMA;  Surgeon: Newt Minion, MD;  Location: [place];  Service: Orthopedics;  Laterality: Left;   CARDIAC CATHETERIZATION     CHOLECYSTECTOMY N/A 11/03/2014   Procedure: LAPAROSCOPIC CHOLECYSTECTOMY WITH INTRAOPERATIVE CHOLANGIOGRAM;  Surgeon: Gayland Curry, MD;  [facility];  Service: General;  Laterality: N/A;   COLONOSCOPY N/A 03/27/2013   Procedure: COLONOSCOPY;  Surgeon: Rogene Houston, MD;   [facility];  Service: Endoscopy;  Laterality: N/A;  1030   COLONOSCOPY N/A 06/05/2018   Procedure: COLONOSCOPY;  Surgeon: Rogene Houston, MD;  [facility];  Service: Endoscopy;  Laterality: N/A;  1025   EYE SURGERY     FOOT ARTHRODESIS Left 07/29/2021   Procedure: REMOVAL HARDWARE AND REVISION FUSION LEFT GREAT TOE METATARSOPHALANGEAL JOINT;  Surgeon: Newt Minion, MD;  Location: [place];  Service: Orthopedics;  Laterality: Left;   KNEE ARTHROSCOPY W/ MENISCAL REPAIR Right    TUBAL LIGATION     WEIL OSTEOTOMY Left 01/18/2021   Procedure: WEIL OSTEOTOMY LEFT 2ND AND 3RD METATARSAL;  Surgeon: Newt Minion, MD;  Location: [place];  Service: Orthopedics;  Laterality: Left;   Social History   Occupational History   Not on file  Tobacco Use   Smoking status: Never   Smokeless tobacco: Never  Vaping Use   Vaping Use: Never used  Substance and Sexual Activity   Alcohol use: Not Currently   Drug use: Never   Sexual activity: Not on file

## 2021-10-17 ENCOUNTER — Encounter: Payer: Self-pay | Admitting: Orthopedic Surgery

## 2021-11-10 ENCOUNTER — Ambulatory Visit (INDEPENDENT_AMBULATORY_CARE_PROVIDER_SITE_OTHER): Payer: Medicare Other | Admitting: Orthopedic Surgery

## 2021-11-10 DIAGNOSIS — M2022 Hallux rigidus, left foot: Secondary | ICD-10-CM

## 2021-11-10 NOTE — Progress Notes (Unsigned)
Office Visit Note  Patient: Kimberly Reyes             Date of Birth: [birth year]           MRN: [id number]             PCP: Sharilyn Sites, MD Referring: Sharilyn Sites, MD Visit Date: 11/23/2021 Occupation: @GUAROCC @  Subjective:  No chief complaint on file.   History of Present Illness: Kimberly Reyes is a 66 y.o. female ***   Activities of Daily Living:  Patient reports morning stiffness for *** {minute/hour:19697}.   Patient {ACTIONS;DENIES/REPORTS:21021675::"Denies"} nocturnal pain.  Difficulty dressing/grooming: {ACTIONS;DENIES/REPORTS:21021675::"Denies"} Difficulty climbing stairs: {ACTIONS;DENIES/REPORTS:21021675::"Denies"} Difficulty getting out of chair: {ACTIONS;DENIES/REPORTS:21021675::"Denies"} Difficulty using hands for taps, buttons, cutlery, and/or writing: {ACTIONS;DENIES/REPORTS:21021675::"Denies"}  No Rheumatology ROS completed.   PMFS History:  Patient Active Problem List   Diagnosis Date Noted   Pain from implanted hardware    Hallux rigidus, left foot    Claw toe, acquired, left    Mass of left foot    Family hx of colon cancer 03/29/2018   Elevated LFTs 08/06/2017   Primary osteoarthritis of right hip 08/06/2017   Primary osteoarthritis of both feet 08/06/2017   Primary osteoarthritis of both hands 08/06/2017   Myofascial pain 08/06/2017   Hypothyroid 11/25/2014   Hypothyroidism 11/25/2014    Past Medical History:  Diagnosis Date   Anxiety    Arthritis    Asthma   40 yrs ago   not current probem no inhaler use   Autoimmune hepatitis ([place])    Headache(784.0)    Hypothyroidism    Pneumonia     Family History  Problem Relation Age of Onset   Colon cancer Father    CVA Mother    Pneumonia Brother    Cancer Sister        breast, thyroid    Heart disease Sister    Healthy Son    Past Surgical History:  Procedure Laterality Date   ABDOMINAL HYSTERECTOMY     20 years ago, complete   ARTHRODESIS  METATARSALPHALANGEAL JOINT (MTPJ) Left 01/18/2021   Procedure: LEFT FOOT FIRST METATARSAL PHALANGEAL ARTHRODESIS AND EXCISION OF LEFT ANKLE LIPOMA;  Surgeon: Newt Minion, MD;  Location: [place];  Service: Orthopedics;  Laterality: Left;   CARDIAC CATHETERIZATION     CHOLECYSTECTOMY N/A 11/03/2014   Procedure: LAPAROSCOPIC CHOLECYSTECTOMY WITH INTRAOPERATIVE CHOLANGIOGRAM;  Surgeon: Gayland Curry, MD;  [facility];  Service: General;  Laterality: N/A;   COLONOSCOPY N/A 03/27/2013   Procedure: COLONOSCOPY;  Surgeon: Rogene Houston, MD;  [facility];  Service: Endoscopy;  Laterality: N/A;  1030   COLONOSCOPY N/A 06/05/2018   Procedure: COLONOSCOPY;  Surgeon: Rogene Houston, MD;  [facility];  Service: Endoscopy;  Laterality: N/A;  1025   EYE SURGERY     FOOT ARTHRODESIS Left 07/29/2021   Procedure: REMOVAL HARDWARE AND REVISION FUSION LEFT GREAT TOE METATARSOPHALANGEAL JOINT;  Surgeon: Newt Minion, MD;  Location: [place];  Service: Orthopedics;  Laterality: Left;   KNEE ARTHROSCOPY W/ MENISCAL REPAIR Right    TUBAL LIGATION     WEIL OSTEOTOMY Left 01/18/2021   Procedure: WEIL OSTEOTOMY LEFT 2ND AND 3RD METATARSAL;  Surgeon: Newt Minion, MD;  Location: [place];  Service: Orthopedics;  Laterality: Left;   Social History   Social History Narrative   Not on file   Immunization History  Administered Date(s) Administered   Influenza,inj,Quad PF,6+ Mos  07/07/2018   PFIZER(Purple Top)SARS-COV-2 Vaccination 10/23/2019, 11/20/2019, 05/06/2020, 03/23/2021     Objective: Vital Signs: There were no vitals taken for this visit.   Physical Exam   Musculoskeletal Exam: ***  CDAI Exam: CDAI Score: -- Patient Global: --; Provider Global: -- Swollen: --; Tender: -- Joint Exam 11/23/2021   No joint exam has been documented for this visit   There is currently no information documented on the homunculus. Go to  the Rheumatology activity and complete the homunculus joint exam.  Investigation: No additional findings.  Imaging: XR Foot Complete Left  Result Date: 10/16/2021 Radiographs of the left foot shows stable internal fixation no complicating features no hardware failure.   Recent Labs: Lab Results  Component Value Date   WBC 9.2 07/26/2021   HGB 12.5 07/26/2021   PLT 220 07/26/2021   NA 137 07/26/2021   K 4.6 07/26/2021   CL 103 07/26/2021   CO2 26 07/26/2021   GLUCOSE 148 (H) 07/26/2021   BUN 11 07/26/2021   CREATININE 1.08 (H) 07/26/2021   BILITOT 0.5 07/26/2021   ALKPHOS 99 07/26/2021   AST 29 07/26/2021   ALT 24 07/26/2021   PROT 6.5 07/26/2021   ALBUMIN 3.9 07/26/2021   CALCIUM 9.3 07/26/2021   GFRAA 71 06/26/2017   QFTBGOLD NEGATIVE 06/26/2017    Speciality Comments: No specialty comments available.  Procedures:  No procedures performed Allergies: Tylenol [acetaminophen]   Assessment / Plan:     Visit Diagnoses: No diagnosis found.  Orders: No orders of the defined types were placed in this encounter.  No orders of the defined types were placed in this encounter.   Face-to-face time spent with patient was *** minutes. Greater than 50% of time was spent in counseling and coordination of care.  Follow-Up Instructions: No follow-ups on file.   Earnestine Mealing, CMA  Note - This record has been created using Editor, commissioning.  Chart creation errors have been sought, but may not always  have been located. Such creation errors do not reflect on  the standard of medical care.

## 2021-11-15 ENCOUNTER — Telehealth: Payer: Self-pay | Admitting: Orthopedic Surgery

## 2021-11-15 NOTE — Telephone Encounter (Signed)
Patient is requesting copy of records & xrays ? ?Please copy xrays from 12/21-present to CD and let me know when ready.  Thank you ?

## 2021-11-16 NOTE — Telephone Encounter (Signed)
IC, advised pt copy of records & xray CD ready to pickup at front desk ?

## 2021-11-21 ENCOUNTER — Encounter: Payer: Self-pay | Admitting: Orthopedic Surgery

## 2021-11-21 NOTE — Progress Notes (Signed)
? ?Office Visit Note ?  ?Patient: Kimberly Reyes           ?Date of Birth: October 08, 1955           ?MRN: [id number] ?Visit Date: 11/10/2021 ?             ?Requested by: Sharilyn Sites, MD ?[address] ?[place],  [place] [address] ?PCP: Sharilyn Sites, MD ? ?Chief Complaint  ?Patient presents with  ? Left Foot - Follow-up  ?  07/29/21 removal of HDW and revision fusion left GT MTP joint  ? ? ? ? ?HPI: ?Patient is a 66 year old woman who presents over 3 months status post revision fusion left great toe MTP joint.  Patient is currently in regular shoewear with a carbon plate.  Patient complains of pain across the forefoot. ? ?Assessment & Plan: ?Visit Diagnoses:  ?1. Hallux rigidus, left foot   ? ? ?Plan: Recommended sole orthotics with a metatarsal pad to help unload the metatarsal heads. ? ?Follow-Up Instructions: Return if symptoms worsen or fail to improve.  ? ?Ortho Exam ? ?Patient is alert, oriented, no adenopathy, well-dressed, normal affect, normal respiratory effort. ?Examination patient has good dorsiflexion of the ankle.  She has pain across the metatarsal heads secondary to distal migration of the fat pad.  The fusion is stable ? ?Imaging: ?No results found. ?No images are attached to the encounter. ? ?Labs: ?Lab Results  ?Component Value Date  ? ESRSEDRATE 37 (H) 03/01/2017  ? ? ? ?Lab Results  ?Component Value Date  ? ALBUMIN 3.9 07/26/2021  ? ALBUMIN 3.8 01/14/2021  ? ALBUMIN 3.7 03/13/2017  ? ? ?No results found for: MG ?Lab Results  ?Component Value Date  ? VD25OH 57 05/26/2021  ? ? ?No results found for: PREALBUMIN ?CBC EXTENDED Latest Ref Rng & Units 07/26/2021 05/26/2021 07/20/2017  ?WBC 4.0 - 10.5 K/uL 9.2 7.8 8.4  ?RBC 3.87 - 5.11 MIL/uL 4.03 4.40 3.89  ?HGB 12.0 - 15.0 g/dL 12.5 13.3 11.1(L)  ?HCT 36.0 - 46.0 % 38.3 41.2 34.7(L)  ?PLT 150 - 400 K/uL 220 206 193  ?NEUTROABS 1,500 - 7,800 cells/uL - 4,789 -  ?LYMPHSABS 850 - 3,900 cells/uL - 1,810 -  ? ? ? ?There is no height or weight on file to  calculate BMI. ? ?Orders:  ?No orders of the defined types were placed in this encounter. ? ?No orders of the defined types were placed in this encounter. ? ? ? Procedures: ?No procedures performed ? ?Clinical Data: ?No additional findings. ? ?ROS: ? ?All other systems negative, except as noted in the HPI. ?Review of Systems ? ?Objective: ?Vital Signs: There were no vitals taken for this visit. ? ?Specialty Comments:  ?No specialty comments available. ? ?PMFS History: ?Patient Active Problem List  ? Diagnosis Date Noted  ? Pain from implanted hardware   ? Hallux rigidus, left foot   ? Claw toe, acquired, left   ? Mass of left foot   ? Family hx of colon cancer 03/29/2018  ? Elevated LFTs 08/06/2017  ? Primary osteoarthritis of right hip 08/06/2017  ? Primary osteoarthritis of both feet 08/06/2017  ? Primary osteoarthritis of both hands 08/06/2017  ? Myofascial pain 08/06/2017  ? Hypothyroid 11/25/2014  ? Hypothyroidism 11/25/2014  ? ?Past Medical History:  ?Diagnosis Date  ? Anxiety   ? Arthritis   ? Asthma   40 yrs ago  ? not current probem no inhaler use  ? Autoimmune hepatitis ([place])   ? Headache(784.0)   ?  Hypothyroidism   ? Pneumonia   ?  ?Family History  ?Problem Relation Age of Onset  ? Colon cancer Father   ? CVA Mother   ? Pneumonia Brother   ? Cancer Sister   ?     breast, thyroid   ? Heart disease Sister   ? Healthy Son   ?  ?Past Surgical History:  ?Procedure Laterality Date  ? ABDOMINAL HYSTERECTOMY    ? 20 years ago, complete  ? ARTHRODESIS METATARSALPHALANGEAL JOINT (MTPJ) Left 01/18/2021  ? Procedure: LEFT FOOT FIRST METATARSAL PHALANGEAL ARTHRODESIS AND EXCISION OF LEFT ANKLE LIPOMA;  Surgeon: Newt Minion, MD;  Location: [facility];  Service: Orthopedics;  Laterality: Left;  ? CARDIAC CATHETERIZATION    ? CHOLECYSTECTOMY N/A 11/03/2014  ? Procedure: LAPAROSCOPIC CHOLECYSTECTOMY WITH INTRAOPERATIVE CHOLANGIOGRAM;  Surgeon: Gayland Curry, MD;  [facility];  Service: General;   Laterality: N/A;  ? COLONOSCOPY N/A 03/27/2013  ? Procedure: COLONOSCOPY;  Surgeon: Rogene Houston, MD;  [facility];  Service: Endoscopy;  Laterality: N/A;  1030  ? COLONOSCOPY N/A 06/05/2018  ? Procedure: COLONOSCOPY;  Surgeon: Rogene Houston, MD;  [facility];  Service: Endoscopy;  Laterality: N/A;  1025  ? EYE SURGERY    ? FOOT ARTHRODESIS Left 07/29/2021  ? Procedure: REMOVAL HARDWARE AND REVISION FUSION LEFT GREAT TOE METATARSOPHALANGEAL JOINT;  Surgeon: Newt Minion, MD;  Location: [place];  Service: Orthopedics;  Laterality: Left;  ? KNEE ARTHROSCOPY W/ MENISCAL REPAIR Right   ? TUBAL LIGATION    ? WEIL OSTEOTOMY Left 01/18/2021  ? Procedure: WEIL OSTEOTOMY LEFT 2ND AND 3RD METATARSAL;  Surgeon: Newt Minion, MD;  Location: [place];  Service: Orthopedics;  Laterality: Left;  ? ?Social History  ? ?Occupational History  ? Not on file  ?Tobacco Use  ? Smoking status: Never  ? Smokeless tobacco: Never  ?Vaping Use  ? Vaping Use: Never used  ?Substance and Sexual Activity  ? Alcohol use: Not Currently  ? Drug use: Never  ? Sexual activity: Not on file  ? ? ? ? ? ?

## 2021-11-23 ENCOUNTER — Encounter: Payer: Self-pay | Admitting: Rheumatology

## 2021-11-23 ENCOUNTER — Telehealth: Payer: Self-pay

## 2021-11-23 ENCOUNTER — Other Ambulatory Visit: Payer: Self-pay

## 2021-11-23 ENCOUNTER — Ambulatory Visit (INDEPENDENT_AMBULATORY_CARE_PROVIDER_SITE_OTHER): Payer: Medicare Other | Admitting: Rheumatology

## 2021-11-23 VITALS — BP 150/83 | HR 76 | Ht 68.0 in | Wt 198.2 lb

## 2021-11-23 DIAGNOSIS — U071 COVID-19: Secondary | ICD-10-CM

## 2021-11-23 DIAGNOSIS — Z8639 Personal history of other endocrine, nutritional and metabolic disease: Secondary | ICD-10-CM

## 2021-11-23 DIAGNOSIS — E559 Vitamin D deficiency, unspecified: Secondary | ICD-10-CM

## 2021-11-23 DIAGNOSIS — M8589 Other specified disorders of bone density and structure, multiple sites: Secondary | ICD-10-CM

## 2021-11-23 DIAGNOSIS — Z7952 Long term (current) use of systemic steroids: Secondary | ICD-10-CM | POA: Diagnosis not present

## 2021-11-23 DIAGNOSIS — M19071 Primary osteoarthritis, right ankle and foot: Secondary | ICD-10-CM

## 2021-11-23 DIAGNOSIS — M19042 Primary osteoarthritis, left hand: Secondary | ICD-10-CM

## 2021-11-23 DIAGNOSIS — M19041 Primary osteoarthritis, right hand: Secondary | ICD-10-CM | POA: Diagnosis not present

## 2021-11-23 DIAGNOSIS — Z79899 Other long term (current) drug therapy: Secondary | ICD-10-CM | POA: Diagnosis not present

## 2021-11-23 DIAGNOSIS — K754 Autoimmune hepatitis: Secondary | ICD-10-CM | POA: Diagnosis not present

## 2021-11-23 DIAGNOSIS — M170 Bilateral primary osteoarthritis of knee: Secondary | ICD-10-CM

## 2021-11-23 DIAGNOSIS — M40294 Other kyphosis, thoracic region: Secondary | ICD-10-CM

## 2021-11-23 DIAGNOSIS — M7061 Trochanteric bursitis, right hip: Secondary | ICD-10-CM

## 2021-11-23 DIAGNOSIS — M1611 Unilateral primary osteoarthritis, right hip: Secondary | ICD-10-CM

## 2021-11-23 DIAGNOSIS — M7918 Myalgia, other site: Secondary | ICD-10-CM

## 2021-11-23 DIAGNOSIS — M205X2 Other deformities of toe(s) (acquired), left foot: Secondary | ICD-10-CM

## 2021-11-23 NOTE — Telephone Encounter (Signed)
Patient advised it is Dr. Clyde Canterbury.  ?

## 2021-11-23 NOTE — Telephone Encounter (Signed)
Patient states she just had an appointment with Dr. Estanislado Pandy and forgot the name of the orthopedic doctor she recommended at [facility]  Patient requested a return call.   ?

## 2021-11-23 NOTE — Telephone Encounter (Signed)
Dr. Clyde Canterbury

## 2021-11-24 ENCOUNTER — Telehealth: Payer: Self-pay | Admitting: Rheumatology

## 2021-11-24 NOTE — Telephone Encounter (Signed)
Dr. Doran Durand at [facility] or Dr. Para March at [facility].

## 2021-11-24 NOTE — Telephone Encounter (Signed)
Patient called the office stating Dr. Estanislado Pandy referred her to Orthopedics (Dr. Cyril Mourning with [facility]) and he is not accepting new patients. They offered her to see another provider but she wants to make sure Dr. Estanislado Pandy would approve of someone else in that office. Patient requests a call back. Patient states she can go somewhere else if Dr. Estanislado Pandy recommends them. ?

## 2021-11-24 NOTE — Telephone Encounter (Signed)
Patient advised Dr. Doran Durand at [facility] or Dr. Para March at [facility]. ? ?

## 2022-05-17 HISTORY — PX: EYE SURGERY: SHX253

## 2022-05-25 ENCOUNTER — Ambulatory Visit: Payer: Medicare Other | Admitting: Physician Assistant

## 2022-05-26 NOTE — Progress Notes (Signed)
Office Visit Note  Patient: Kimberly Reyes             Date of Birth: September 02, 1956           MRN: [id number]             PCP: Sharilyn Sites, MD Referring: Sharilyn Sites, MD Visit Date: 06/07/2022 Occupation: '@GUAROCC'$ @  Subjective:  Chronic right foot pain   History of Present Illness: Kimberly Reyes is a 66 y.o. female with history of autoimmune hepatitis and osteoarthritis.  Patient remains on Imuran 50 mg 1 tablet by mouth daily and prednisone 5 mg 1 tablet daily as prescribed by Dr. Benson Norway.  She has not had any signs or symptoms of an autoimmune hepatitis flare since her last office visit.  She had a recent office visit and updated lab work on 05/29/2022 and no medication changes were made by Dr. Benson Norway at that time. Patient reports that she continues to have chronic pain in her right knee joint and left foot.  She does not plan on proceeding with a right knee replacement at this time.  Her right knee joint pain is exacerbated by climbing steps but she is still able to do so.  According to the patient she will be undergoing right foot surgery by Dr. Clair Gulling at [place] in January 2024.  Her chronic right foot pain has significantly limited her activity level.  She is no longer able to walk or run for exercise.  She has been working on weight loss and has lost about 10 pounds with dietary changes. She remains under the care of Dr. Tarri Glenn and Dr. Chalmers Cater.    Activities of Daily Living:  Patient reports morning stiffness for 20 minutes.   Patient Reports nocturnal pain.  Difficulty dressing/grooming: Denies Difficulty climbing stairs: Denies Difficulty getting out of chair: Denies Difficulty using hands for taps, buttons, cutlery, and/or writing: Denies  Review of Systems  Constitutional:  Negative for fatigue.  HENT:  Negative for mouth sores, mouth dryness and nose dryness.   Eyes:  Positive for dryness. Negative for pain and visual disturbance.  Respiratory:  Negative for cough,  hemoptysis, shortness of breath and difficulty breathing.   Cardiovascular:  Negative for chest pain, palpitations, hypertension and swelling in legs/feet.  Gastrointestinal:  Negative for blood in stool, constipation and diarrhea.  Endocrine: Negative for increased urination.  Genitourinary:  Negative for painful urination and involuntary urination.  Musculoskeletal:  Positive for joint pain, joint pain, joint swelling and morning stiffness. Negative for gait problem, myalgias, muscle weakness, muscle tenderness and myalgias.  Skin:  Positive for hair loss. Negative for color change, pallor, rash, nodules/bumps, skin tightness, ulcers and sensitivity to sunlight.  Allergic/Immunologic: Negative for susceptible to infections.  Neurological:  Positive for headaches. Negative for dizziness, numbness and weakness.  Hematological:  Negative for swollen glands.  Psychiatric/Behavioral:  Negative for depressed mood and sleep disturbance. The patient is not nervous/anxious.     PMFS History:  Patient Active Problem List   Diagnosis Date Noted   Pain from implanted hardware    Hallux rigidus, left foot    Claw toe, acquired, left    Mass of left foot    Family hx of colon cancer 03/29/2018   Elevated LFTs 08/06/2017   Primary osteoarthritis of right hip 08/06/2017   Primary osteoarthritis of both feet 08/06/2017   Primary osteoarthritis of both hands 08/06/2017   Myofascial pain 08/06/2017   Hypothyroid 11/25/2014   Hypothyroidism 11/25/2014  Past Medical History:  Diagnosis Date   Anxiety    Arthritis    Asthma   40 yrs ago   not current probem no inhaler use   Autoimmune hepatitis ([place])    Headache(784.0)    Hypothyroidism    Pneumonia     Family History  Problem Relation Age of Onset   Colon cancer Father    CVA Mother    Pneumonia Brother    Cancer Sister        breast, thyroid    Heart disease Sister    Healthy Son    Past Surgical History:  Procedure Laterality Date    ABDOMINAL HYSTERECTOMY     20 years ago, complete   ARTHRODESIS METATARSALPHALANGEAL JOINT (MTPJ) Left 01/18/2021   Procedure: LEFT FOOT FIRST METATARSAL PHALANGEAL ARTHRODESIS AND EXCISION OF LEFT ANKLE LIPOMA;  Surgeon: Newt Minion, MD;  Location: [facility];  Service: Orthopedics;  Laterality: Left;   CARDIAC CATHETERIZATION     CHOLECYSTECTOMY N/A 11/03/2014   Procedure: LAPAROSCOPIC CHOLECYSTECTOMY WITH INTRAOPERATIVE CHOLANGIOGRAM;  Surgeon: Gayland Curry, MD;  Location: [facility];  Service: General;  Laterality: N/A;   COLONOSCOPY N/A 03/27/2013   Procedure: COLONOSCOPY;  Surgeon: Rogene Houston, MD;  [facility];  Service: Endoscopy;  Laterality: N/A;  1030   COLONOSCOPY N/A 06/05/2018   Procedure: COLONOSCOPY;  Surgeon: Rogene Houston, MD;  [facility];  Service: Endoscopy;  Laterality: N/A;  1025   EYE SURGERY     EYE SURGERY Bilateral 05/17/2022   muscles in eyelids   FOOT ARTHRODESIS Left 07/29/2021   Procedure: REMOVAL HARDWARE AND REVISION FUSION LEFT GREAT TOE METATARSOPHALANGEAL JOINT;  Surgeon: Newt Minion, MD;  Location: [place];  Service: Orthopedics;  Laterality: Left;   KNEE ARTHROSCOPY W/ MENISCAL REPAIR Right    TUBAL LIGATION     WEIL OSTEOTOMY Left 01/18/2021   Procedure: WEIL OSTEOTOMY LEFT 2ND AND 3RD METATARSAL;  Surgeon: Newt Minion, MD;  Location: [place];  Service: Orthopedics;  Laterality: Left;   Social History   Social History Narrative   Not on file   Immunization History  Administered Date(s) Administered   Influenza,inj,Quad PF,6+ Mos 07/07/2018   PFIZER(Purple Top)SARS-COV-2 Vaccination 10/23/2019, 11/20/2019, 05/06/2020, 03/23/2021     Objective: Vital Signs: BP 125/81 (BP Location: Left Arm, Patient Position: Sitting, Cuff Size: Normal)   Pulse 80   Resp 15   Ht 5' 8.5" (1.74 m)   Wt 180 lb (81.6 kg)   BMI 26.97 kg/m    Physical Exam Vitals and nursing note reviewed.   Constitutional:      Appearance: She is well-developed.  HENT:     Head: Normocephalic and atraumatic.  Eyes:     Conjunctiva/sclera: Conjunctivae normal.  Cardiovascular:     Rate and Rhythm: Normal rate and regular rhythm.     Heart sounds: Normal heart sounds.  Pulmonary:     Effort: Pulmonary effort is normal.     Breath sounds: Normal breath sounds.  Abdominal:     General: Bowel sounds are normal.     Palpations: Abdomen is soft.  Musculoskeletal:     Cervical back: Normal range of motion.  Skin:    General: Skin is warm and dry.     Capillary Refill: Capillary refill takes less than 2 seconds.  Neurological:     Mental Status: She is alert and oriented to person, place, and time.  Psychiatric:  Behavior: Behavior normal.      Musculoskeletal Exam: C-spine has slightly limited ROM with lateral rotation.  Thoracic kyphosis noted.  lumbar spine has good range of motion.  Shoulder joints, elbow joints, wrist joints, MCPs, PIPs, DIPs have good range of motion with no synovitis.  She has PIP and DIP thickening consistent with osteoarthritis of both hands.  Some thickening over the right second MCP joint.  Hip joints have good range of motion with no groin pain.  Some painful range of motion of the right knee joint with warmth but no effusion.  Left knee joint has no warmth or effusion.    CDAI Exam: CDAI Score: -- Patient Global: --; Provider Global: -- Swollen: --; Tender: -- Joint Exam 06/07/2022   No joint exam has been documented for this visit   There is currently no information documented on the homunculus. Go to the Rheumatology activity and complete the homunculus joint exam.  Investigation: No additional findings.  Imaging: No results found.  Recent Labs: Lab Results  Component Value Date   WBC 9.2 07/26/2021   HGB 12.5 07/26/2021   PLT 220 07/26/2021   NA 137 07/26/2021   K 4.6 07/26/2021   CL 103 07/26/2021   CO2 26 07/26/2021   GLUCOSE 148  (H) 07/26/2021   BUN 11 07/26/2021   CREATININE 1.08 (H) 07/26/2021   BILITOT 0.5 07/26/2021   ALKPHOS 99 07/26/2021   AST 29 07/26/2021   ALT 24 07/26/2021   PROT 6.5 07/26/2021   ALBUMIN 3.9 07/26/2021   CALCIUM 9.3 07/26/2021   GFRAA 71 06/26/2017   QFTBGOLD NEGATIVE 06/26/2017    Speciality Comments: No specialty comments available.  Procedures:  No procedures performed Allergies: Tylenol [acetaminophen]   Assessment / Plan:     Visit Diagnoses: Autoimmune hepatitis ([place]) - Followed by Dr. Benson Norway.  She remains on Imuran 50 mg 1 tablet by mouth daily and prednisone 5 mg 1 tablet daily prescribed by Dr. Benson Norway.  LFTs within normal limits on 04/03/2022.  She had a recent office visit and updated lab work on 05/29/2022 so we will call to obtain these records. She was advised to notify us if she develops signs or symptoms of a flare.  She will follow-up in the office in 5 months or sooner if needed.  High risk medication use - Imuran 50 mg 1 tablet by mouth daily and prednisone 5 mg 1 tablet by mouth daily-prescribed by Dr. Benson Norway.  CBC and CMP were drawn on 04/03/2022.  According to the patient she had updated lab work on 05/29/2022 so we will call to obtain these records as well. No recent or recurrent infections.   Primary osteoarthritis of both hands: She has PIP and DIP thickening consistent with osteoarthritis of both hands.  She has some tenderness and thickening over the right second MCP joint but no synovitis was noted.  Complete fist formation noted bilaterally.  Discussed the importance of joint protection and muscle strengthening.  Primary osteoarthritis of right hip: She has good range of motion of the right hip joint with no discomfort at this time.  Trochanteric bursitis of both hips: She experiences intermittent tenderness over the trochanteric bursa of both hips.  Discussed importance of performing stretching exercises daily.  Primary osteoarthritis of both knees - She has  been followed by Dr. Noemi Chapel.  She has chronic pain in the right knee joint.  Her symptoms are exacerbated by climbing steps.  She has not been experiencing any nocturnal pain.  She has not ready to proceed with a right knee replacement at this time.  Discussed the importance of lower extremity muscle strengthening.  Primary osteoarthritis of both feet: Chronic pain in the left foot.  Patient was evaluated by Dr. Clair Gulling at [place] and is planning on proceeding with surgery in January 2024.  Claw toe, acquired, left - Chronic pain.  Difficulty ambulating and standing for prolonged periods of time. She underwent a MTP fusion of the left great toe and an osteotomy of the left second and third metatarsal performed by Dr. Sharol Given on 01/18/2021. Unsuccessful fusion-requiring hardware removal on 07/29/21.  Complicated by nonunion and progressive deformity of left 1st MTP joint. Evaluated by Dr. Para March who recommend surgical consultation with Dr. Clair Gulling at [facility]. Reviewed office visit note from 04/03/22.  Patient is currently scheduled for upcoming surgery in January 2024.    Osteopenia of multiple sites - DEXA on January 05, 2020 showed T score of -2.3, no significant change in BMD when compared to the DEXA scan of 2019. Reclast IV in 2021 by Dr. Chalmers Cater.  Remains on long term prednisone 5 mg daily and is unable to taper.  Future order for a bone density was placed today.  The patient would like to hold off on having a repeat Reclast infusion until she has had an updated bone density.  She continues to take a calcium and vitamin D supplement daily. - Plan: DG BONE DENSITY (DXA)  Other kyphosis of thoracic region: Unchanged.  No midline spinal tenderness.    Myofascial pain: She has been living with chronic pain in the left foot for the past 2 years.  Her mobility has been limited and is affecting her quality of life.  She has been experiencing increased anxiety due to the chronic pain she is  experiencing.  She does not want to take narcotics for pain management.  Discussed the possible use of Cymbalta to help with mood as well as fort the cross over with pain relief.  She plans on further discussing Cymbalta with her PCP.  On prednisone therapy - She is on long-term prednisone 5 mg 1 tablet by mouth daily prescribed by Dr. Benson Norway. Unable to taper.  Future order for DEXA placed today.   History of hypothyroidism: Followed by Dr. Chalmers Cater.   Vitamin D deficiency: She is taking a calcium and vitamin D supplement daily.   Orders: Orders Placed This Encounter  Procedures   DG BONE DENSITY (DXA)   No orders of the defined types were placed in this encounter.   Follow-Up Instructions: Return in about 5 months (around 11/07/2022) for Autoimmune hepatitis, Osteoarthritis.   Ofilia Neas, PA-C  Note - This record has been created using Dragon software.  Chart creation errors have been sought, but may not always  have been located. Such creation errors do not reflect on  the standard of medical care.

## 2022-06-07 ENCOUNTER — Encounter: Payer: Self-pay | Admitting: Physician Assistant

## 2022-06-07 ENCOUNTER — Ambulatory Visit: Payer: Medicare Other | Attending: Physician Assistant | Admitting: Physician Assistant

## 2022-06-07 VITALS — BP 125/81 | HR 80 | Resp 15 | Ht 68.5 in | Wt 180.0 lb

## 2022-06-07 DIAGNOSIS — M19042 Primary osteoarthritis, left hand: Secondary | ICD-10-CM | POA: Diagnosis present

## 2022-06-07 DIAGNOSIS — M7062 Trochanteric bursitis, left hip: Secondary | ICD-10-CM | POA: Insufficient documentation

## 2022-06-07 DIAGNOSIS — K754 Autoimmune hepatitis: Secondary | ICD-10-CM | POA: Diagnosis present

## 2022-06-07 DIAGNOSIS — Z8639 Personal history of other endocrine, nutritional and metabolic disease: Secondary | ICD-10-CM | POA: Insufficient documentation

## 2022-06-07 DIAGNOSIS — M19041 Primary osteoarthritis, right hand: Secondary | ICD-10-CM | POA: Insufficient documentation

## 2022-06-07 DIAGNOSIS — Z7952 Long term (current) use of systemic steroids: Secondary | ICD-10-CM | POA: Diagnosis present

## 2022-06-07 DIAGNOSIS — M7061 Trochanteric bursitis, right hip: Secondary | ICD-10-CM | POA: Insufficient documentation

## 2022-06-07 DIAGNOSIS — M205X2 Other deformities of toe(s) (acquired), left foot: Secondary | ICD-10-CM | POA: Diagnosis present

## 2022-06-07 DIAGNOSIS — M8589 Other specified disorders of bone density and structure, multiple sites: Secondary | ICD-10-CM | POA: Diagnosis present

## 2022-06-07 DIAGNOSIS — M40294 Other kyphosis, thoracic region: Secondary | ICD-10-CM | POA: Diagnosis present

## 2022-06-07 DIAGNOSIS — M19071 Primary osteoarthritis, right ankle and foot: Secondary | ICD-10-CM | POA: Diagnosis present

## 2022-06-07 DIAGNOSIS — Z79899 Other long term (current) drug therapy: Secondary | ICD-10-CM | POA: Diagnosis present

## 2022-06-07 DIAGNOSIS — E559 Vitamin D deficiency, unspecified: Secondary | ICD-10-CM | POA: Diagnosis present

## 2022-06-07 DIAGNOSIS — M7918 Myalgia, other site: Secondary | ICD-10-CM | POA: Insufficient documentation

## 2022-06-07 DIAGNOSIS — M19072 Primary osteoarthritis, left ankle and foot: Secondary | ICD-10-CM | POA: Insufficient documentation

## 2022-06-07 DIAGNOSIS — M170 Bilateral primary osteoarthritis of knee: Secondary | ICD-10-CM | POA: Diagnosis present

## 2022-06-07 DIAGNOSIS — M1611 Unilateral primary osteoarthritis, right hip: Secondary | ICD-10-CM | POA: Diagnosis present

## 2022-06-07 NOTE — Patient Instructions (Signed)
Cymbalta 

## 2022-06-12 ENCOUNTER — Ambulatory Visit (HOSPITAL_COMMUNITY)
Admission: RE | Admit: 2022-06-12 | Discharge: 2022-06-12 | Disposition: A | Discharge status: Home / Self Care | Payer: Medicare Other | Source: Ambulatory Visit | Attending: Physician Assistant | Admitting: Physician Assistant

## 2022-06-12 DIAGNOSIS — M8589 Other specified disorders of bone density and structure, multiple sites: Secondary | ICD-10-CM | POA: Diagnosis present

## 2022-06-12 NOTE — Progress Notes (Signed)
DEXA results consistent with osteopenia.  T-score -2.1 right hip.   Please discuss the importance of calcium, vitamin D, and resistive exercises.

## 2022-06-21 NOTE — Progress Notes (Signed)
Reviewed lab work from 05/29/22 ordered by Dr. Benson Norway.  LFTs WNL.  Creatinine is borderline elevated and GFR is borderline low.  Recommend avoiding the use of NSAIDs.

## 2022-06-21 NOTE — Progress Notes (Signed)
Please see above note

## 2022-09-04 ENCOUNTER — Emergency Department (HOSPITAL_COMMUNITY): Payer: Medicare Other

## 2022-09-04 ENCOUNTER — Emergency Department (HOSPITAL_COMMUNITY)
Admission: EM | Admit: 2022-09-04 | Discharge: 2022-09-05 | Disposition: A | Discharge status: Home / Self Care | Payer: Medicare Other | Attending: Emergency Medicine | Admitting: Emergency Medicine

## 2022-09-04 ENCOUNTER — Encounter (HOSPITAL_COMMUNITY): Payer: Self-pay

## 2022-09-04 ENCOUNTER — Other Ambulatory Visit: Payer: Self-pay

## 2022-09-04 DIAGNOSIS — R079 Chest pain, unspecified: Secondary | ICD-10-CM | POA: Diagnosis present

## 2022-09-04 DIAGNOSIS — J45909 Unspecified asthma, uncomplicated: Secondary | ICD-10-CM | POA: Diagnosis not present

## 2022-09-04 DIAGNOSIS — R0602 Shortness of breath: Secondary | ICD-10-CM | POA: Diagnosis not present

## 2022-09-04 DIAGNOSIS — Z20822 Contact with and (suspected) exposure to covid-19: Secondary | ICD-10-CM | POA: Insufficient documentation

## 2022-09-04 LAB — COMPREHENSIVE METABOLIC PANEL
ALT: 28 U/L (ref 0–44)
AST: 40 U/L (ref 15–41)
Albumin: 4.1 g/dL (ref 3.5–5.0)
Alkaline Phosphatase: 113 U/L (ref 38–126)
Anion gap: 10 (ref 5–15)
BUN: 20 mg/dL (ref 8–23)
CO2: 22 mmol/L (ref 22–32)
Calcium: 9.6 mg/dL (ref 8.9–10.3)
Chloride: 107 mmol/L (ref 98–111)
Creatinine, Ser: 0.93 mg/dL (ref 0.44–1.00)
GFR, Estimated: >60 mL/min (ref >60)
Glucose, Bld: 112 mg/dL — ABNORMAL HIGH (ref 70–99)
Potassium: 3.7 mmol/L (ref 3.5–5.1)
Sodium: 139 mmol/L (ref 135–145)
Total Bilirubin: 0.6 mg/dL (ref 0.3–1.2)
Total Protein: 7.1 g/dL (ref 6.5–8.1)

## 2022-09-04 LAB — TROPONIN I (HIGH SENSITIVITY): Troponin I (High Sensitivity): 3 ng/L (ref <18)

## 2022-09-04 LAB — CBC
HCT: 35.7 % — ABNORMAL LOW (ref 36.0–46.0)
Hemoglobin: 11.7 g/dL — ABNORMAL LOW (ref 12.0–15.0)
MCH: 30.3 pg (ref 26.0–34.0)
MCHC: 32.8 g/dL (ref 30.0–36.0)
MCV: 92.5 fL (ref 80.0–100.0)
Platelets: 209 10*3/uL (ref 150–400)
RBC: 3.86 MIL/uL — ABNORMAL LOW (ref 3.87–5.11)
RDW: 12.5 % (ref 11.5–15.5)
WBC: 14.5 10*3/uL — ABNORMAL HIGH (ref 4.0–10.5)
nRBC: 0 % (ref 0.0–0.2)

## 2022-09-04 LAB — D-DIMER, QUANTITATIVE: D-Dimer, Quant: 0.63 ug/mL-FEU — ABNORMAL HIGH (ref 0.00–0.50)

## 2022-09-04 MED ORDER — FENTANYL CITRATE PF 50 MCG/ML IJ SOSY
50.0000 ug | PREFILLED_SYRINGE | Freq: Once | INTRAMUSCULAR | Status: AC
  Administered 2022-09-04: 50 ug via INTRAVENOUS
  Filled 2022-09-04: qty 1

## 2022-09-04 NOTE — ED Triage Notes (Addendum)
Pt c/o sudden onset sharp chest pain on the R side that happens with inspiration. With radiation to R shoulder and back. Pain started while sitting at home at 1800. Pt denies any alleviating factors.

## 2022-09-04 NOTE — ED Provider Notes (Signed)
Care of the patient assumed at the change of shift. Here for sharp R upper chest pain. Pending labs and imaging.  Physical Exam  BP 114/66   Pulse 81   Resp (!) 22   Ht 5' 8.5" (1.74 m)   Wt 76.2 kg   SpO2 100%   BMI 25.17 kg/m   Physical Exam  Procedures  Procedures  ED Course / MDM   Clinical Course as of 09/05/22 0348  Mon Sep 04, 2022  2359 CBC with mild leukocytosis. CMP is unremarkable. Trop is normal. Dimer is mildly elevated but age adjusted to low likelihood.  [CS]  Tue Sep 05, 2022  0000 I personally viewed the images from radiology studies and agree with radiologist interpretation: CXR is clear [CS]  0153 On re-evaluation pain is returned, despite low risk Dimer will send for CTA to rule out PE or other acute process that may be causing her pain.  [CS]  0153 Repeat trop is neg.  [CS]  [id number] I personally viewed the images from radiology studies and agree with radiologist interpretation: CTA is neg. Patient reports pain is improved. No clear etiology for her pain but does not appear to be a life threatening cardiopulmonary process and she would like to go home. Recommend Motrin/APAP, heating pad and PCP follow up. RTED for any worsening.   [CS]    Clinical Course User Index [CS] Truddie Hidden, MD   Medical Decision Making Problems Addressed: Nonspecific chest pain: acute illness or injury  Amount and/or Complexity of Data Reviewed Labs: ordered. Decision-making details documented in ED Course. Radiology: ordered and independent interpretation performed. Decision-making details documented in ED Course. ECG/medicine tests: ordered.  Risk Prescription drug management. Decision regarding hospitalization.          Truddie Hidden, MD 09/05/22 [phone]

## 2022-09-04 NOTE — ED Notes (Signed)
Pt ambulatory to bathroom with no difficulty or distress noted. °

## 2022-09-04 NOTE — ED Provider Notes (Signed)
[facility] EMERGENCY DEPARTMENT Provider Note   CSN: [id number] Arrival date & time: 09/04/22  2123     History  Chief Complaint  Patient presents with   Chest Pain    Kimberly Reyes is a 66 y.o. female.  Patient presents the emergency department complaining of right-sided chest pain which is worse with inspiration.  Pain is described as sharp and 10 out of 10 in severity.  Radiation to right shoulder and back.  The patient was sitting at home when the pain started with no known aggravating factor.  Patient denies any alleviating factors.  Patient also endorses shortness of breath.  She denies abdominal pain, nausea, vomiting.  She does endorse being on amoxicillin for a sinus infection.  Patient has history of autoimmune hepatitis, asthma, anxiety  HPI     Home Medications Prior to Admission medications   Medication Sig Start Date End Date Taking? Authorizing Provider  Ascorbic Acid (VITAMIN C WITH ROSE HIPS) 1000 MG tablet Take 1,000 mg by mouth daily.    [provider]  azaTHIOprine (IMURAN) 50 MG tablet Take 50 mg by mouth at bedtime.     [provider]  Calcium-Magnesium-Vitamin D (CALCIUM 1200+D3 PO) Take 1 tablet by mouth at bedtime.    [provider]  carboxymethylcellulose (REFRESH PLUS) 0.5 % SOLN Place 1 drop into both eyes 3 (three) times daily as needed (dry eyes).    [provider]  Cyanocobalamin (VITAMIN B-12) 5000 MCG SUBL Place 5,000 mcg under the tongue at bedtime.     [provider]  folic acid (FOLVITE) 1 MG tablet Take 1 mg by mouth daily.    [provider]  Glucosamine HCl (GLUCOSAMINE PO) Take 1 tablet by mouth daily.    [provider]  L-Theanine 100 MG CAPS Take 100 mg by mouth daily.    [provider]  levothyroxine (SYNTHROID) 100 MCG tablet Take 100 mcg by mouth daily before breakfast. 06/23/17   [provider]  MAGNESIUM CITRATE PO Take 100 mg by mouth daily.     [provider]  minoxidil (LONITEN) 10 MG tablet Take 5 mg by mouth daily.    [provider]  Multiple Vitamin (MULTIVITAMIN) tablet Take 1 tablet by mouth every evening.     [provider]  Multiple Vitamins-Minerals (HAIR/SKIN/NAILS/BIOTIN PO) Take 3 each by mouth daily.    [provider]  OVER THE COUNTER MEDICATION Take 2 tablets by mouth daily. Focus factor    [provider]  predniSONE (DELTASONE) 5 MG tablet Take 5 mg by mouth daily after breakfast.  07/27/17   [provider]  Probiotic Product (PROBIOTIC DAILY PO) Take 1 capsule by mouth daily.    [provider]  Turmeric Curcumin 500 MG CAPS Take 500 mg by mouth every evening.    [provider]  zolpidem (AMBIEN) 10 MG tablet Take 5 mg by mouth at bedtime as needed for sleep. 05/12/17   [provider]      Allergies    Tylenol [acetaminophen]    Review of Systems   Review of Systems  Constitutional:  Negative for fever.  Respiratory:  Positive for shortness of breath. Negative for cough.   Cardiovascular:  Positive for chest pain. Negative for palpitations and leg swelling.  Gastrointestinal:  Negative for abdominal pain, nausea and vomiting.  Genitourinary:  Negative for dysuria.  Musculoskeletal:  Positive for back pain (Low back pain).    Physical Exam Updated Vital  Signs Pulse 80   Resp 16   Ht 5' 8.5" (1.74 m)   Wt 76.2 kg   SpO2 100%   BMI 25.17 kg/m  Physical Exam Vitals and nursing note reviewed.  Constitutional:      Appearance: She is well-developed.     Comments: Patient rocking, visibly uncomfortable  HENT:     Head: Normocephalic and atraumatic.  Eyes:     Conjunctiva/sclera: Conjunctivae normal.  Cardiovascular:     Rate and Rhythm: Normal rate and regular rhythm.     Heart sounds: No murmur heard. Pulmonary:     Effort: Pulmonary effort is normal. No respiratory distress.     Breath sounds: Normal breath  sounds.  Abdominal:     Palpations: Abdomen is soft.     Tenderness: There is no abdominal tenderness.  Musculoskeletal:        General: No swelling.     Cervical back: Neck supple.  Skin:    General: Skin is warm and dry.     Capillary Refill: Capillary refill takes less than 2 seconds.  Neurological:     Mental Status: She is alert.  Psychiatric:        Mood and Affect: Mood normal.     ED Results / Procedures / Treatments   Labs (all labs ordered are listed, but only abnormal results are displayed) Labs Reviewed  CBC - Abnormal; Notable for the following components:      Result Value   WBC 14.5 (*)    RBC 3.86 (*)    Hemoglobin 11.7 (*)    HCT 35.7 (*)    All other components within normal limits  D-DIMER, QUANTITATIVE - Abnormal; Notable for the following components:   D-Dimer, Quant 0.63 (*)    All other components within normal limits  RESP PANEL BY RT-PCR (RSV, FLU A&B, COVID)  [id number]  COMPREHENSIVE METABOLIC PANEL  TROPONIN I (HIGH SENSITIVITY)    EKG EKG Interpretation  Date/Time:  Monday September 04 2022 21:44:33 EST Ventricular Rate:  95 PR Interval:  124 QRS Duration: 84 QT Interval:  358 QTC Calculation: 449 R Axis:   59 Text Interpretation: Normal sinus rhythm no acute ST/T changes Interpretation limited secondary to artifact Confirmed by Sherwood Gambler [phone]) on 09/04/2022 10:04:31 PM  Radiology No results found.  Procedures Procedures    Medications Ordered in ED Medications  fentaNYL (SUBLIMAZE) injection 50 mcg (has no administration in time range)    ED Course/ Medical Decision Making/ A&P                           Medical Decision Making Amount and/or Complexity of Data Reviewed Labs: ordered. Radiology: ordered.  Risk Prescription drug management.   This patient presents to the ED for concern of chest pain, this involves an extensive number of treatment options, and is a complaint that carries with it a high risk of  complications and morbidity.  The differential diagnosis includes ACS, pneumonia, pleurisy, PE, and others   Co morbidities that complicate the patient evaluation  History of autoimmune hepatitis, anxiety   Additional history obtained:  Additional history obtained from family at bedside External records from outside source obtained and reviewed including notes from rheumatology with visit in September for follow-up on autoimmune hepatitis   Lab Tests:  I Ordered, and personally interpreted labs.  The pertinent results include: WBC 14.5, D-dimer 0.63 (negative if age-adjusted)   Imaging Studies ordered:  I ordered imaging  studies including chest x-ray    Cardiac Monitoring: / EKG:  The patient was maintained on a cardiac monitor.  I personally viewed and interpreted the cardiac monitored which showed an underlying rhythm of: Normal sinus rhythm  Problem List / ED Course / Critical interventions / Medication management   I ordered medication including fentanyl for pain    Test / Admission - Considered:  Patient care being transferred to handoff to Dr. Karle Starch.  Differential diagnosis continues to include ACS, PE, pleurisy, pneumonia, and others.  Disposition pending results of troponin and remainder of labs along with chest x-ray.        Final Clinical Impression(s) / ED Diagnoses Final diagnoses:  None    Rx / DC Orders ED Discharge Orders     None         Ronny Bacon 09/04/22 2306    Sherwood Gambler, MD 09/07/22 0930

## 2022-09-05 ENCOUNTER — Emergency Department (HOSPITAL_COMMUNITY): Payer: Medicare Other

## 2022-09-05 DIAGNOSIS — R079 Chest pain, unspecified: Secondary | ICD-10-CM | POA: Diagnosis not present

## 2022-09-05 LAB — RESP PANEL BY RT-PCR (RSV, FLU A&B, COVID)  RVPGX2
Influenza A by PCR: NEGATIVE
Influenza B by PCR: NEGATIVE
Resp Syncytial Virus by PCR: NEGATIVE
SARS Coronavirus 2 by RT PCR: NEGATIVE

## 2022-09-05 LAB — TROPONIN I (HIGH SENSITIVITY): Troponin I (High Sensitivity): 3 ng/L (ref <18)

## 2022-09-05 MED ORDER — IOHEXOL 350 MG/ML SOLN
100.0000 mL | Freq: Once | INTRAVENOUS | Status: AC | PRN
  Administered 2022-09-05: 100 mL via INTRAVENOUS

## 2022-09-05 MED ORDER — KETOROLAC TROMETHAMINE 30 MG/ML IJ SOLN
15.0000 mg | Freq: Once | INTRAMUSCULAR | Status: AC
  Administered 2022-09-05: 15 mg via INTRAVENOUS
  Filled 2022-09-05: qty 1

## 2022-09-05 NOTE — ED Notes (Signed)
Patient transported to CT 

## 2022-09-13 ENCOUNTER — Telehealth: Payer: Self-pay

## 2022-09-13 NOTE — Telephone Encounter (Signed)
     Patient  visit on 12/26  at [place] you been able to follow up with your primary care physician? Yes   The patient was or was not able to obtain any needed medicine or equipment. Yes   Are there diet recommendations that you are having difficulty following? Na   Patient expresses understanding of discharge instructions and education provided has no other needs at this time.  Yes     [place], [facility], Care Management  [phone] [address], [place], [place] [address] Phone: [phone] Email: [email or web address]

## 2022-09-27 HISTORY — PX: FOOT ARTHRODESIS: SHX1655

## 2022-11-29 NOTE — Progress Notes (Signed)
Office Visit Note  Patient: Kimberly Reyes             Date of Birth: 1956/07/30           MRN: [id number]             PCP: Sharilyn Sites, MD Referring: Sharilyn Sites, MD Visit Date: 12/13/2022 Occupation: @GUAROCC @  Subjective:  Pain in joints  History of Present Illness: Kimberly Reyes is a 67 y.o. female with history of osteoarthritis, autoimmune disease and osteopenia.  She states she underwent left foot reconstruction surgery by Dr. Clair Gulling in January 2024.  She is gradually recovering from it.  She is still in a boot.  She states the surgery went well and her pain has improved.  Because of wearing the boot she is having some discomfort in her right knee and her left hip.  She has not noticed any joint swelling.  She continues to be on Imuran and prednisone as prescribed by Dr. Benson Norway.  She has been closely following with Dr. Benson Norway.  She had her last Reclast infusion in 2021 by Dr. Michiel Sites.  Most recent bone density improved.    Activities of Daily Living:  Patient reports morning stiffness for 20 minutes.   Patient Reports nocturnal pain.  Difficulty dressing/grooming: Denies Difficulty climbing stairs: Denies Difficulty getting out of chair: Denies Difficulty using hands for taps, buttons, cutlery, and/or writing: Denies  Review of Systems  Constitutional:  Negative for fatigue.  HENT:  Negative for mouth sores and mouth dryness.   Eyes:  Negative for dryness.  Respiratory:  Negative for shortness of breath.   Cardiovascular:  Negative for chest pain and palpitations.  Gastrointestinal:  Negative for blood in stool, constipation and diarrhea.  Endocrine: Negative for increased urination.  Genitourinary:  Negative for involuntary urination.  Musculoskeletal:  Positive for joint pain, joint pain, joint swelling and morning stiffness. Negative for gait problem, myalgias, muscle weakness, muscle tenderness and myalgias.  Skin:  Positive for hair loss. Negative for color change,  rash and sensitivity to sunlight.  Allergic/Immunologic: Positive for susceptible to infections.  Neurological:  Positive for headaches. Negative for dizziness.  Hematological:  Negative for swollen glands.  Psychiatric/Behavioral:  Positive for sleep disturbance. Negative for depressed mood. The patient is not nervous/anxious.     PMFS History:  Patient Active Problem List   Diagnosis Date Noted   Pain from implanted hardware    Hallux rigidus, left foot    Claw toe, acquired, left    Mass of left foot    Family hx of colon cancer 03/29/2018   Elevated LFTs 08/06/2017   Primary osteoarthritis of right hip 08/06/2017   Primary osteoarthritis of both feet 08/06/2017   Primary osteoarthritis of both hands 08/06/2017   Myofascial pain 08/06/2017   Hypothyroid 11/25/2014   Hypothyroidism 11/25/2014    Past Medical History:  Diagnosis Date   Anxiety    Arthritis    Asthma   40 yrs ago   not current probem no inhaler use   Autoimmune hepatitis    Headache(784.0)    Hypothyroidism    Pneumonia     Family History  Problem Relation Age of Onset   Colon cancer Father    CVA Mother    Pneumonia Brother    Cancer Sister        breast, thyroid    Heart disease Sister    Healthy Son    Past Surgical History:  Procedure Laterality Date  ABDOMINAL HYSTERECTOMY     20 years ago, complete   ARTHRODESIS METATARSALPHALANGEAL JOINT (MTPJ) Left 01/18/2021   Procedure: LEFT FOOT FIRST METATARSAL PHALANGEAL ARTHRODESIS AND EXCISION OF LEFT ANKLE LIPOMA;  Surgeon: Newt Minion, MD;  Location: [place];  Service: Orthopedics;  Laterality: Left;   CARDIAC CATHETERIZATION     CHOLECYSTECTOMY N/A 11/03/2014   Procedure: LAPAROSCOPIC CHOLECYSTECTOMY WITH INTRAOPERATIVE CHOLANGIOGRAM;  Surgeon: Gayland Curry, MD;  Location: [facility];  Service: General;  Laterality: N/A;   COLONOSCOPY N/A 03/27/2013   Procedure: COLONOSCOPY;  Surgeon: Rogene Houston, MD;  [facility];  Service: Endoscopy;  Laterality: N/A;  1030   COLONOSCOPY N/A 06/05/2018   Procedure: COLONOSCOPY;  Surgeon: Rogene Houston, MD;  [facility];  Service: Endoscopy;  Laterality: N/A;  1025   EYE SURGERY     EYE SURGERY Bilateral 05/17/2022   muscles in eyelids   FOOT ARTHRODESIS Left 07/29/2021   Procedure: REMOVAL HARDWARE AND REVISION FUSION LEFT GREAT TOE METATARSOPHALANGEAL JOINT;  Surgeon: Newt Minion, MD;  Location: [place];  Service: Orthopedics;  Laterality: Left;   FOOT ARTHRODESIS Left 09/27/2022   KNEE ARTHROSCOPY W/ MENISCAL REPAIR Right    TUBAL LIGATION     WEIL OSTEOTOMY Left 01/18/2021   Procedure: WEIL OSTEOTOMY LEFT 2ND AND 3RD METATARSAL;  Surgeon: Newt Minion, MD;  Location: [place];  Service: Orthopedics;  Laterality: Left;   Social History   Social History Narrative   Not on file   Immunization History  Administered Date(s) Administered   Influenza,inj,Quad PF,6+ Mos 07/07/2018   PFIZER(Purple Top)SARS-COV-2 Vaccination 10/23/2019, 11/20/2019, 05/06/2020, 03/23/2021     Objective: Vital Signs: BP 111/69 (BP Location: Left Arm, Patient Position: Sitting, Cuff Size: Normal)   Pulse 73   Resp 12   Ht 5' 8.5" (1.74 m)   Wt 157 lb (71.2 kg)   BMI 23.52 kg/m    Physical Exam Vitals and nursing note reviewed.  Constitutional:      Appearance: She is well-developed.  HENT:     Head: Normocephalic and atraumatic.  Eyes:     Conjunctiva/sclera: Conjunctivae normal.  Cardiovascular:     Rate and Rhythm: Normal rate and regular rhythm.     Heart sounds: Normal heart sounds.  Pulmonary:     Effort: Pulmonary effort is normal.     Breath sounds: Normal breath sounds.  Abdominal:     General: Bowel sounds are normal.     Palpations: Abdomen is soft.  Musculoskeletal:     Cervical back: Normal range of motion.  Lymphadenopathy:     Cervical: No cervical adenopathy.  Skin:    General: Skin is warm and dry.      Capillary Refill: Capillary refill takes less than 2 seconds.  Neurological:     Mental Status: She is alert and oriented to person, place, and time.  Psychiatric:        Behavior: Behavior normal.      Musculoskeletal Exam: She had limited lateral rotation of the cervical spine.  Thoracic kyphosis was noted.  There was no tenderness over thoracic or lumbar spine.  Shoulders, elbows, wrist joints were in good range of motion.  MCPs PIPs and DIPs were in good range of motion with no synovitis.  Bilateral PIP and DIP thickening consistent with osteoarthritis was noted.  Hip joints and knee joints were in good range of motion.  No warmth swelling or effusion was noted.  Left lower extremity was in a boot.  CDAI Exam: CDAI Score: -- Patient Global: --; Provider Global: -- Swollen: --; Tender: -- Joint Exam 12/13/2022   No joint exam has been documented for this visit   There is currently no information documented on the homunculus. Go to the Rheumatology activity and complete the homunculus joint exam.  Investigation: No additional findings.  Imaging: No results found.  Recent Labs: Lab Results  Component Value Date   WBC 14.5 (H) 09/04/2022   HGB 11.7 (L) 09/04/2022   PLT 209 09/04/2022   NA 139 09/04/2022   K 3.7 09/04/2022   CL 107 09/04/2022   CO2 22 09/04/2022   GLUCOSE 112 (H) 09/04/2022   BUN 20 09/04/2022   CREATININE 0.93 09/04/2022   BILITOT 0.6 09/04/2022   ALKPHOS 113 09/04/2022   AST 40 09/04/2022   ALT 28 09/04/2022   PROT 7.1 09/04/2022   ALBUMIN 4.1 09/04/2022   CALCIUM 9.6 09/04/2022   GFRAA 71 06/26/2017   QFTBGOLD NEGATIVE 06/26/2017    Speciality Comments: No specialty comments available.  Procedures:  No procedures performed Allergies: Tylenol [acetaminophen]   Assessment / Plan:     Visit Diagnoses: Autoimmune hepatitis -she is followed by Dr. Benson Norway.  She is 50 mg 1 tablet by mouth daily and prednisone 5 mg 1 tablet daily prescribed by Dr.  Benson Norway.  She denies any side effects from the medication.  She has been taking medication on a regular basis.  She has an appointment coming up with Dr. Benson Norway soon.  On prednisone therapy-prednisone 5 mg p.o. daily.  High risk medication use - Imuran 50 mg 1 tablet by mouth daily and prednisone 5 mg 1 tablet by mouth daily-prescribed by Dr. Benson Norway.  Labs are followed by Dr. Melanee Spry.  Last labs in December were within normal limits.  Myofascial pain-she continues to have some generalized pain and discomfort from myofascial pain.  Stretching exercises were advised.  Primary osteoarthritis of both hands-bilateral PIP and DIP thickening was noted.  No synovitis was noted.  Primary osteoarthritis of right hip-patient had good range of motion without discomfort today.  Trochanteric bursitis of both hips-she has intermittent comfort in the trochanteric region.  She had no tenderness on the examination today.  She states she continues to have some discomfort in the left trochanteric bursa.  Primary osteoarthritis of both knees-she has been having discomfort in right knee joint.  No warmth swelling or effusion was noted.  Primary osteoarthritis of both feet-she underwent left foot surgery by Dr[facility] Clair [facility]ull[facility] at [facility].  She had good results.  She is in the boot currently.  Claw toe, acquired, left - L 1st MTP fusion,osteotomy of left 2nd & 3rd metatarasal-Dr. Sharol Given 01/18/2021. Unsuccessful fusion-hardware removal on 07/29/21.nonunion, deformity L1st MTP joint  Osteopenia of multiple sites - DEXA on January 05, 2020 showed T score of -2.3, no significant change in BMD when compared to the DEXA scan of 2019. Reclast IV in 2021 by Dr. Chalmers Cater.  June 12, 2022 the BMD measured at Femur Neck Right is 0.746 g/cm2 with a T-score of -2.1.  She is currently on a drug holiday.  Calcium rich diet and exercise was emphasized.  Vitamin D deficiency-regular use of vitamin D was discussed.  Other kyphosis of thoracic region  History of  hypothyroidism  Orders: No orders of the defined types were placed in this encounter.  No orders of the defined types were placed in this encounter.    Follow-Up Instructions: Return  in about 6 months (around 06/14/2023) for Osteoarthritis.   Bo Merino, MD  Note - This record has been created using Editor, commissioning.  Chart creation errors have been sought, but may not always  have been located. Such creation errors do not reflect on  the standard of medical care.

## 2022-12-13 ENCOUNTER — Ambulatory Visit: Payer: Medicare Other | Attending: Rheumatology | Admitting: Rheumatology

## 2022-12-13 ENCOUNTER — Encounter: Payer: Self-pay | Admitting: Rheumatology

## 2022-12-13 VITALS — BP 111/69 | HR 73 | Resp 12 | Ht 68.5 in | Wt 157.0 lb

## 2022-12-13 DIAGNOSIS — M170 Bilateral primary osteoarthritis of knee: Secondary | ICD-10-CM | POA: Insufficient documentation

## 2022-12-13 DIAGNOSIS — Z7952 Long term (current) use of systemic steroids: Secondary | ICD-10-CM | POA: Insufficient documentation

## 2022-12-13 DIAGNOSIS — M40294 Other kyphosis, thoracic region: Secondary | ICD-10-CM | POA: Diagnosis present

## 2022-12-13 DIAGNOSIS — M7062 Trochanteric bursitis, left hip: Secondary | ICD-10-CM

## 2022-12-13 DIAGNOSIS — Z79899 Other long term (current) drug therapy: Secondary | ICD-10-CM | POA: Diagnosis not present

## 2022-12-13 DIAGNOSIS — K754 Autoimmune hepatitis: Secondary | ICD-10-CM

## 2022-12-13 DIAGNOSIS — Z8639 Personal history of other endocrine, nutritional and metabolic disease: Secondary | ICD-10-CM | POA: Diagnosis present

## 2022-12-13 DIAGNOSIS — M7061 Trochanteric bursitis, right hip: Secondary | ICD-10-CM | POA: Insufficient documentation

## 2022-12-13 DIAGNOSIS — E559 Vitamin D deficiency, unspecified: Secondary | ICD-10-CM | POA: Insufficient documentation

## 2022-12-13 DIAGNOSIS — M1611 Unilateral primary osteoarthritis, right hip: Secondary | ICD-10-CM | POA: Diagnosis present

## 2022-12-13 DIAGNOSIS — M205X2 Other deformities of toe(s) (acquired), left foot: Secondary | ICD-10-CM | POA: Diagnosis present

## 2022-12-13 DIAGNOSIS — M19071 Primary osteoarthritis, right ankle and foot: Secondary | ICD-10-CM

## 2022-12-13 DIAGNOSIS — M19041 Primary osteoarthritis, right hand: Secondary | ICD-10-CM | POA: Insufficient documentation

## 2022-12-13 DIAGNOSIS — M8589 Other specified disorders of bone density and structure, multiple sites: Secondary | ICD-10-CM

## 2022-12-13 DIAGNOSIS — M19072 Primary osteoarthritis, left ankle and foot: Secondary | ICD-10-CM | POA: Diagnosis present

## 2022-12-13 DIAGNOSIS — M19042 Primary osteoarthritis, left hand: Secondary | ICD-10-CM | POA: Diagnosis present

## 2022-12-13 DIAGNOSIS — M7918 Myalgia, other site: Secondary | ICD-10-CM | POA: Diagnosis not present

## 2023-04-30 ENCOUNTER — Encounter (INDEPENDENT_AMBULATORY_CARE_PROVIDER_SITE_OTHER): Payer: Self-pay | Admitting: *Deleted

## 2023-05-31 NOTE — Progress Notes (Unsigned)
Office Visit Note  Patient: Kimberly Reyes             Date of Birth: [birth year]           MRN: [id number]             PCP: Assunta Found, MD Referring: Assunta Found, MD Visit Date: 06/13/2023 Occupation: @GUAROCC @  Subjective:  Medication monitoring   History of Present Illness: Kimberly Reyes is a 67 y.o. female with history of autoimmune hepatitis, myofascial pain, and osteoarthritis.  Patient remains on Imuran 50 mg 1 tablet by mouth daily and prednisone 5 mg 1 tablet by mouth daily-prescribed by Dr. Elnoria Howard.  She is tolerating Imuran without any side effects and has not missed any doses recently. Patient reports that she underwent left index mucinous cyst removal on 06/07/2023 by Dr. Merlyn Lot.  She has a postop follow-up scheduled on Friday. She continues to experience intermittent pain especially in her right thumb and on the lateral aspect of both hips consistent with bursitis.  Patient states that she has increased her walking regimen to 3 miles daily.  She continues to have chronic numbness on the outside of her right foot but has found hooka's to be helpful as she has increased her walking regimen.     Activities of Daily Living:  Patient reports morning stiffness for 20 minutes.   Patient Reports nocturnal pain.  Difficulty dressing/grooming: Denies Difficulty climbing stairs: Denies Difficulty getting out of chair: Denies Difficulty using hands for taps, buttons, cutlery, and/or writing: Denies  Review of Systems  Constitutional:  Negative for fatigue.  HENT:  Negative for mouth sores and mouth dryness.   Eyes:  Negative for dryness.  Respiratory:  Negative for shortness of breath.   Cardiovascular:  Negative for chest pain and palpitations.  Gastrointestinal:  Negative for blood in stool, constipation and diarrhea.  Endocrine: Negative for increased urination.  Genitourinary:  Negative for involuntary urination.  Musculoskeletal:  Positive for joint pain, joint pain,  joint swelling and morning stiffness. Negative for gait problem, myalgias, muscle weakness, muscle tenderness and myalgias.  Skin:  Negative for color change, rash, hair loss and sensitivity to sunlight.  Allergic/Immunologic: Negative for susceptible to infections.  Neurological:  Positive for headaches. Negative for dizziness.  Hematological:  Negative for swollen glands.  Psychiatric/Behavioral:  Positive for sleep disturbance. Negative for depressed mood. The patient is not nervous/anxious.     PMFS History:  Patient Active Problem List   Diagnosis Date Noted   Pain from implanted hardware    Hallux rigidus, left foot    Claw toe, acquired, left    Mass of left foot    Family hx of colon cancer 03/29/2018   Elevated LFTs 08/06/2017   Primary osteoarthritis of right hip 08/06/2017   Primary osteoarthritis of both feet 08/06/2017   Primary osteoarthritis of both hands 08/06/2017   Myofascial pain 08/06/2017   Hypothyroid 11/25/2014   Hypothyroidism 11/25/2014    Past Medical History:  Diagnosis Date   Anxiety    Arthritis    Asthma   40 yrs ago   not current probem no inhaler use   Autoimmune hepatitis (HCC)    Headache(784.0)    Hypothyroidism    Pneumonia     Family History  Problem Relation Age of Onset   Colon cancer Father    CVA Mother    Pneumonia Brother    Cancer Sister        breast, thyroid  Heart disease Sister    Healthy Son    Past Surgical History:  Procedure Laterality Date   ABDOMINAL HYSTERECTOMY     20 years ago, complete   ARTHRODESIS METATARSALPHALANGEAL JOINT (MTPJ) Left 01/18/2021   Procedure: LEFT FOOT FIRST METATARSAL PHALANGEAL ARTHRODESIS AND EXCISION OF LEFT ANKLE LIPOMA;  Surgeon: Nadara Mustard, MD;  Location: [facility];  Service: Orthopedics;  Laterality: Left;   CARDIAC CATHETERIZATION     CHOLECYSTECTOMY N/A 11/03/2014   Procedure: LAPAROSCOPIC CHOLECYSTECTOMY WITH INTRAOPERATIVE CHOLANGIOGRAM;  Surgeon: Atilano Ina, MD;  [facility];  Service: General;  Laterality: N/A;   COLONOSCOPY N/A 03/27/2013   Procedure: COLONOSCOPY;  Surgeon: Malissa Hippo, MD;  [facility];  Service: Endoscopy;  Laterality: N/A;  1030   COLONOSCOPY N/A 06/05/2018   Procedure: COLONOSCOPY;  Surgeon: Malissa Hippo, MD;  [facility];  Service: Endoscopy;  Laterality: N/A;  1025   CYST EXCISION Left 06/07/2023   Procedure: LEFT INDEX FINGER EXCISION MUCOID CYST AND DEBRIDEMENT DISTAL INTERPHALANGEAL JOINT;  Surgeon: [name], MD;  Location: [facility];  Service: Orthopedics;  Laterality: Left;   EYE SURGERY     EYE SURGERY Bilateral 05/17/2022   muscles in eyelids   FOOT ARTHRODESIS Left 07/29/2021   Procedure: REMOVAL HARDWARE AND REVISION FUSION LEFT GREAT TOE METATARSOPHALANGEAL JOINT;  Surgeon: Nadara Mustard, MD;  [facility];  Service: Orthopedics;  Laterality: Left;   FOOT ARTHRODESIS Left 09/27/2022   KNEE ARTHROSCOPY W/ MENISCAL REPAIR Right    TUBAL LIGATION     WEIL OSTEOTOMY Left 01/18/2021   Procedure: WEIL OSTEOTOMY LEFT 2ND AND 3RD METATARSAL;  Surgeon: Nadara Mustard, MD;  Location: [facility];  Service: Orthopedics;  Laterality: Left;   Social History   Social History Narrative   Not on file   Immunization History  Administered Date(s) Administered   Influenza,inj,Quad PF,6+ Mos 07/07/2018   PFIZER(Purple Top)SARS-COV-2 Vaccination 10/23/2019, 11/20/2019, 05/06/2020, 03/23/2021     Objective: Vital Signs: BP 122/72 (BP Location: Right Arm, Patient Position: Sitting, Cuff Size: Normal)   Pulse 66   Resp 14   Ht 5\' 8"  (1.727 m)   Wt 145 lb 3.2 oz (65.9 kg)   BMI 22.08 kg/m    Physical Exam Vitals and nursing note reviewed.  Constitutional:      Appearance: She is well-developed.  HENT:     Head: Normocephalic and atraumatic.  Eyes:     Conjunctiva/sclera: Conjunctivae normal.  Cardiovascular:     Rate and Rhythm:  Normal rate and regular rhythm.     Heart sounds: Normal heart sounds.  Pulmonary:     Effort: Pulmonary effort is normal.     Breath sounds: Normal breath sounds.  Abdominal:     General: Bowel sounds are normal.     Palpations: Abdomen is soft.  Musculoskeletal:     Cervical back: Normal range of motion.  Lymphadenopathy:     Cervical: No cervical adenopathy.  Skin:    General: Skin is warm and dry.     Capillary Refill: Capillary refill takes less than 2 seconds.  Neurological:     Mental Status: She is alert and oriented to person, place, and time.  Psychiatric:        Behavior: Behavior normal.      Musculoskeletal Exam: C-spine has limited range of motion without rotation.  Thoracic kyphosis noted.  Shoulder joints, elbow joints, wrist joints, MCPs, PIPs, DIPs  have good range of motion with no synovitis.  PIP and DIP thickening consistent with osteoarthritis of both hands.  Hip joints have good range of motion with no groin pain.  Knee joints have good range of motion no warmth or effusion. Tenderness over bilateral trochanteric bursa.   CDAI Exam: CDAI Score: -- Patient Global: --; Provider Global: -- Swollen: --; Tender: -- Joint Exam 06/13/2023   No joint exam has been documented for this visit   There is currently no information documented on the homunculus. Go to the Rheumatology activity and complete the homunculus joint exam.  Investigation: No additional findings.  Imaging: No results found.  Recent Labs: Lab Results  Component Value Date   WBC 14.5 (H) 09/04/2022   HGB 11.7 (L) 09/04/2022   PLT 209 09/04/2022   NA 139 09/04/2022   K 3.7 09/04/2022   CL 107 09/04/2022   CO2 22 09/04/2022   GLUCOSE 112 (H) 09/04/2022   BUN 20 09/04/2022   CREATININE 0.93 09/04/2022   BILITOT 0.6 09/04/2022   ALKPHOS 113 09/04/2022   AST 40 09/04/2022   ALT 28 09/04/2022   PROT 7.1 09/04/2022   ALBUMIN 4.1 09/04/2022   CALCIUM 9.6 09/04/2022   GFRAA 71  06/26/2017   QFTBGOLD NEGATIVE 06/26/2017    Speciality Comments: No specialty comments available.  Procedures:  No procedures performed Allergies: Tylenol [acetaminophen]      Assessment / Plan:     Visit Diagnoses: Autoimmune hepatitis (HCC) - She is followed by Dr. Elnoria Howard.  She is taking Imuran 50 mg 1 tablet by mouth daily and prednisone 5 mg 1 tablet daily prescribed by Dr. Elnoria Howard.  She has not developed any new or worsening symptoms.  Plan to recheck LFTs today along with routine lab work.  Results will be forwarded to Dr. Elnoria Howard.  She will follow up in 5-6 months or sooner if needed.   On prednisone therapy - Prednisone 5 mg p.o. daily.  High risk medication use - Imuran 50 mg 1 tablet by mouth daily and prednisone 5 mg 1 tablet by mouth daily-prescribed by Dr. Elnoria Howard. CBC and CMP updated on 09/14/22. Orders for CBC and CMP released today.   Myofascial pain: Not currently symptomatic.   Primary osteoarthritis of both hands: Patient underwent left index finger mucinous cyst removal and debridement on 06/07/2023 performed by Dr. Merlyn Lot. She has PIP and DIP thickening consistent with osteoarthritis of both hands.  Tenderness and subluxation of the right first MCP joint noted.  She has not had a cortisone injection in the past.  Patient was vies notify us if her symptoms persist or worsen.  No synovitis was noted on exam.  Primary osteoarthritis of right hip: No groin pain currently.   Trochanteric bursitis of both hips: She has tenderness over bilateral trochanteric bursa.  Discussed the importance of performing stretching exercises daily.  She was given a handout of exercises to perform.  Primary osteoarthritis of both knees: Chronic pain.  She does not want to proceed with knee replacements at this time.  On examination she has warmth involving the right knee but no effusion noted.  Primary osteoarthritis of both feet -She underwent left foot surgery by Dr. Remer Macho at [facility]. She is wearing  hooka's for support.   Claw toe, acquired, left - L 1st MTP fusion,osteotomy of left 2nd & 3rd metatarasal-Dr. Lajoyce Corners 01/18/2021. Unsuccessful fusion-hardware removal on 07/29/21.nonunion, deformity L1st MTP joint.   Required surgical intervention x3.   Established care with Dr. Remer Macho  at [facility].  Pain level has been more manageable.  She has been able to walk 3 miles daily for exercise.   Osteopenia of multiple sites - Reclast IV in 2021 by Dr. Talmage Nap. June 12, 2022 the BMD measured at Femur Neck Right is 0.746 g/cm2 with a T-score of -2.1.  She is currently on a drug holiday.  She is taking a calcium and vitamin D supplement daily. She has been walking 3 miles daily for exercise.  She has also been using resistive bands.  Vitamin D deficiency: She is taking a calcium and vitamin D supplement.  Other kyphosis of thoracic region  History of hypothyroidism  Orders: Orders Placed This Encounter  Procedures   CBC with Differential/Platelet   COMPLETE METABOLIC PANEL WITH GFR   No orders of the defined types were placed in this encounter.     Follow-Up Instructions: Return in about 5 months (around 11/11/2023) for Autoimmune hepatitis, myofascial pain, Osteoarthritis.   Gearldine Bienenstock, PA-C  Note - This record has been created using Dragon software.  Chart creation errors have been sought, but may not always  have been located. Such creation errors do not reflect on  the standard of medical care.

## 2023-06-06 ENCOUNTER — Encounter (HOSPITAL_BASED_OUTPATIENT_CLINIC_OR_DEPARTMENT_OTHER): Payer: Self-pay | Admitting: Orthopedic Surgery

## 2023-06-06 ENCOUNTER — Other Ambulatory Visit: Payer: Self-pay

## 2023-06-06 ENCOUNTER — Other Ambulatory Visit: Payer: Self-pay | Admitting: Orthopedic Surgery

## 2023-06-07 ENCOUNTER — Encounter (HOSPITAL_BASED_OUTPATIENT_CLINIC_OR_DEPARTMENT_OTHER): Payer: Self-pay | Admitting: Orthopedic Surgery

## 2023-06-07 ENCOUNTER — Encounter (HOSPITAL_BASED_OUTPATIENT_CLINIC_OR_DEPARTMENT_OTHER)
Admission: RE | Disposition: A | Discharge status: Home / Self Care | Payer: Self-pay | Source: Ambulatory Visit | Attending: Orthopedic Surgery

## 2023-06-07 ENCOUNTER — Ambulatory Visit (HOSPITAL_BASED_OUTPATIENT_CLINIC_OR_DEPARTMENT_OTHER): Payer: Medicare Other | Admitting: Anesthesiology

## 2023-06-07 ENCOUNTER — Ambulatory Visit (HOSPITAL_BASED_OUTPATIENT_CLINIC_OR_DEPARTMENT_OTHER)
Admission: RE | Admit: 2023-06-07 | Discharge: 2023-06-07 | Disposition: A | Discharge status: Home / Self Care | Payer: Medicare Other | Source: Ambulatory Visit | Attending: Orthopedic Surgery | Admitting: Orthopedic Surgery

## 2023-06-07 DIAGNOSIS — Z01818 Encounter for other preprocedural examination: Secondary | ICD-10-CM

## 2023-06-07 DIAGNOSIS — M25742 Osteophyte, left hand: Secondary | ICD-10-CM | POA: Insufficient documentation

## 2023-06-07 DIAGNOSIS — E039 Hypothyroidism, unspecified: Secondary | ICD-10-CM | POA: Insufficient documentation

## 2023-06-07 DIAGNOSIS — F419 Anxiety disorder, unspecified: Secondary | ICD-10-CM | POA: Diagnosis not present

## 2023-06-07 DIAGNOSIS — M19042 Primary osteoarthritis, left hand: Secondary | ICD-10-CM | POA: Insufficient documentation

## 2023-06-07 DIAGNOSIS — M67442 Ganglion, left hand: Secondary | ICD-10-CM | POA: Insufficient documentation

## 2023-06-07 DIAGNOSIS — M19041 Primary osteoarthritis, right hand: Secondary | ICD-10-CM | POA: Diagnosis not present

## 2023-06-07 HISTORY — PX: CYST EXCISION: SHX5701

## 2023-06-07 SURGERY — CYST REMOVAL
Anesthesia: General | Site: Finger | Laterality: Left

## 2023-06-07 MED ORDER — DEXAMETHASONE SODIUM PHOSPHATE 10 MG/ML IJ SOLN
INTRAMUSCULAR | Status: DC | PRN
  Administered 2023-06-07: 4 mg via INTRAVENOUS

## 2023-06-07 MED ORDER — BUPIVACAINE HCL (PF) 0.25 % IJ SOLN
INTRAMUSCULAR | Status: DC | PRN
  Administered 2023-06-07: 9 mL

## 2023-06-07 MED ORDER — LIDOCAINE 2% (20 MG/ML) 5 ML SYRINGE
INTRAMUSCULAR | Status: DC | PRN
  Administered 2023-06-07: 40 mg via INTRAVENOUS

## 2023-06-07 MED ORDER — LACTATED RINGERS IV SOLN: INTRAVENOUS | Status: DC

## 2023-06-07 MED ORDER — ONDANSETRON HCL 4 MG/2ML IJ SOLN
INTRAMUSCULAR | Status: DC | PRN
  Administered 2023-06-07: 4 mg via INTRAVENOUS

## 2023-06-07 MED ORDER — EPHEDRINE 5 MG/ML INJ
INTRAVENOUS | Status: AC
  Filled 2023-06-07: qty 5

## 2023-06-07 MED ORDER — NEOSTIGMINE METHYLSULFATE 3 MG/3ML IV SOSY
PREFILLED_SYRINGE | INTRAVENOUS | Status: AC
  Filled 2023-06-07: qty 3

## 2023-06-07 MED ORDER — FENTANYL CITRATE (PF) 250 MCG/5ML IJ SOLN
INTRAMUSCULAR | Status: DC | PRN
  Administered 2023-06-07: 50 ug via INTRAVENOUS

## 2023-06-07 MED ORDER — ARTIFICIAL TEARS OPHTHALMIC OINT
TOPICAL_OINTMENT | OPHTHALMIC | Status: AC
  Filled 2023-06-07: qty 3.5

## 2023-06-07 MED ORDER — MIDAZOLAM HCL 2 MG/2ML IJ SOLN
INTRAMUSCULAR | Status: AC
  Filled 2023-06-07: qty 2

## 2023-06-07 MED ORDER — PHENYLEPHRINE 80 MCG/ML (10ML) SYRINGE FOR IV PUSH (FOR BLOOD PRESSURE SUPPORT)
PREFILLED_SYRINGE | INTRAVENOUS | Status: AC
  Filled 2023-06-07: qty 10

## 2023-06-07 MED ORDER — EPHEDRINE SULFATE-NACL 50-0.9 MG/10ML-% IV SOSY
PREFILLED_SYRINGE | INTRAVENOUS | Status: DC | PRN
  Administered 2023-06-07 (×2): 10 mg via INTRAVENOUS

## 2023-06-07 MED ORDER — PROPOFOL 10 MG/ML IV BOLUS
INTRAVENOUS | Status: AC
  Filled 2023-06-07: qty 20

## 2023-06-07 MED ORDER — CEFAZOLIN SODIUM-DEXTROSE 2-4 GM/100ML-% IV SOLN: 2.0000 g | INTRAVENOUS | Status: DC

## 2023-06-07 MED ORDER — PHENYLEPHRINE 80 MCG/ML (10ML) SYRINGE FOR IV PUSH (FOR BLOOD PRESSURE SUPPORT)
PREFILLED_SYRINGE | INTRAVENOUS | Status: DC | PRN
  Administered 2023-06-07 (×3): 160 ug via INTRAVENOUS

## 2023-06-07 MED ORDER — LACTATED RINGERS IV SOLN: INTRAVENOUS | Status: DC | PRN

## 2023-06-07 MED ORDER — ONDANSETRON HCL 4 MG/2ML IJ SOLN
INTRAMUSCULAR | Status: AC
  Filled 2023-06-07: qty 2

## 2023-06-07 MED ORDER — OXYCODONE HCL 5 MG PO TABS: ORAL_TABLET | ORAL | 0 refills | Status: DC

## 2023-06-07 MED ORDER — FENTANYL CITRATE (PF) 100 MCG/2ML IJ SOLN
INTRAMUSCULAR | Status: AC
  Filled 2023-06-07: qty 2

## 2023-06-07 MED ORDER — FENTANYL CITRATE (PF) 100 MCG/2ML IJ SOLN: 25.0000 ug | INTRAMUSCULAR | Status: DC | PRN

## 2023-06-07 MED ORDER — MIDAZOLAM HCL 5 MG/5ML IJ SOLN
INTRAMUSCULAR | Status: DC | PRN
  Administered 2023-06-07: 1 mg via INTRAVENOUS

## 2023-06-07 MED ORDER — PROPOFOL 10 MG/ML IV BOLUS
INTRAVENOUS | Status: DC | PRN
  Administered 2023-06-07: 200 mg via INTRAVENOUS

## 2023-06-07 SURGICAL SUPPLY — 54 items
APL PRP STRL LF DISP 70% ISPRP (MISCELLANEOUS) ×2
APL SKNCLS STERI-STRIP NONHPOA (GAUZE/BANDAGES/DRESSINGS)
BANDAGE GAUZE 1X75IN STRL (MISCELLANEOUS) IMPLANT
BENZOIN TINCTURE PRP APPL 2/3 (GAUZE/BANDAGES/DRESSINGS) IMPLANT
BLADE MINI RND TIP GREEN BEAV (BLADE) IMPLANT
BLADE SURG 15 STRL LF DISP TIS (BLADE) ×6 IMPLANT
BLADE SURG 15 STRL SS (BLADE) ×4
BNDG CMPR 5X2 CHSV 1 LYR STRL (GAUZE/BANDAGES/DRESSINGS)
BNDG CMPR 5X2 KNTD ELC UNQ LF (GAUZE/BANDAGES/DRESSINGS)
BNDG CMPR 5X3 KNIT ELC UNQ LF (GAUZE/BANDAGES/DRESSINGS)
BNDG CMPR 75X11 PLY HI ABS (MISCELLANEOUS)
BNDG CMPR 75X21 PLY HI ABS (MISCELLANEOUS)
BNDG CMPR 9X4 STRL LF SNTH (GAUZE/BANDAGES/DRESSINGS)
BNDG COHESIVE 1X5 TAN STRL LF (GAUZE/BANDAGES/DRESSINGS) IMPLANT
BNDG COHESIVE 2X5 TAN ST LF (GAUZE/BANDAGES/DRESSINGS) IMPLANT
BNDG ELASTIC 2INX 5YD STR LF (GAUZE/BANDAGES/DRESSINGS) IMPLANT
BNDG ELASTIC 3INX 5YD STR LF (GAUZE/BANDAGES/DRESSINGS) IMPLANT
BNDG ESMARK 4X9 LF (GAUZE/BANDAGES/DRESSINGS) IMPLANT
BNDG GAUZE 1X75IN STRL (MISCELLANEOUS)
BNDG GAUZE DERMACEA FLUFF 4 (GAUZE/BANDAGES/DRESSINGS) IMPLANT
BNDG GZE DERMACEA 4 6PLY (GAUZE/BANDAGES/DRESSINGS)
BNDG PLASTER X FAST 3X3 WHT LF (CAST SUPPLIES) IMPLANT
BNDG PLSTR 9X3 FST ST WHT (CAST SUPPLIES)
CHLORAPREP W/TINT 26 (MISCELLANEOUS) ×3 IMPLANT
CORD BIPOLAR FORCEPS 12FT (ELECTRODE) ×3 IMPLANT
COVER BACK TABLE 60X90IN (DRAPES) ×3 IMPLANT
COVER MAYO STAND STRL (DRAPES) ×3 IMPLANT
CUFF TOURN SGL QUICK 18X4 (TOURNIQUET CUFF) ×3 IMPLANT
DRAPE EXTREMITY T 121X128X90 (DISPOSABLE) ×3 IMPLANT
DRAPE SURG 17X23 STRL (DRAPES) ×3 IMPLANT
GAUZE SPONGE 4X4 12PLY STRL (GAUZE/BANDAGES/DRESSINGS) ×3 IMPLANT
GAUZE STRETCH 2X75IN STRL (MISCELLANEOUS) IMPLANT
GAUZE XEROFORM 1X8 LF (GAUZE/BANDAGES/DRESSINGS) ×3 IMPLANT
GLOVE BIO SURGEON STRL SZ7.5 (GLOVE) ×3 IMPLANT
GLOVE BIOGEL PI IND STRL 8 (GLOVE) ×3 IMPLANT
GOWN STRL REUS W/ TWL LRG LVL3 (GOWN DISPOSABLE) ×3 IMPLANT
GOWN STRL REUS W/TWL LRG LVL3 (GOWN DISPOSABLE) ×2
NDL HYPO 25X1 1.5 SAFETY (NEEDLE) ×2 IMPLANT
NEEDLE HYPO 25X1 1.5 SAFETY (NEEDLE) ×2 IMPLANT
NS IRRIG 1000ML POUR BTL (IV SOLUTION) ×3 IMPLANT
PACK BASIN DAY SURGERY FS (CUSTOM PROCEDURE TRAY) ×3 IMPLANT
PAD CAST 3X4 CTTN HI CHSV (CAST SUPPLIES) IMPLANT
PAD CAST 4YDX4 CTTN HI CHSV (CAST SUPPLIES) IMPLANT
PADDING CAST ABS COTTON 4X4 ST (CAST SUPPLIES) ×3 IMPLANT
PADDING CAST COTTON 3X4 STRL (CAST SUPPLIES)
PADDING CAST COTTON 4X4 STRL (CAST SUPPLIES)
STOCKINETTE 4X48 STRL (DRAPES) ×3 IMPLANT
STRIP CLOSURE SKIN 1/2X4 (GAUZE/BANDAGES/DRESSINGS) IMPLANT
SUT ETHILON 3 0 PS 1 (SUTURE) IMPLANT
SUT ETHILON 4 0 PS 2 18 (SUTURE) ×3 IMPLANT
SYR BULB EAR ULCER 3OZ GRN STR (SYRINGE) ×3 IMPLANT
SYR CONTROL 10ML LL (SYRINGE) ×3 IMPLANT
TOWEL GREEN STERILE FF (TOWEL DISPOSABLE) ×6 IMPLANT
UNDERPAD 30X36 HEAVY ABSORB (UNDERPADS AND DIAPERS) ×3 IMPLANT

## 2023-06-07 NOTE — Anesthesia Procedure Notes (Signed)
Procedure Name: LMA Insertion Date/Time: 06/07/2023 4:03 PM  Performed by: Demetrio Lapping, CRNAPre-anesthesia Checklist: Patient identified, Emergency Drugs available, Suction available and Patient being monitored Patient Re-evaluated:Patient Re-evaluated prior to induction Oxygen Delivery Method: Circle System Utilized Preoxygenation: Pre-oxygenation with 100% oxygen Induction Type: IV induction Ventilation: Mask ventilation without difficulty LMA: LMA inserted LMA Size: 4.0 Number of attempts: 2 Airway Equipment and Method: Bite block Placement Confirmation: positive ETCO2 Tube secured with: Tape Dental Injury: Teeth and Oropharynx as per pre-operative assessment

## 2023-06-07 NOTE — Anesthesia Preprocedure Evaluation (Addendum)
Anesthesia Evaluation  Patient identified by MRN, date of birth, ID band Patient awake    Reviewed: Allergy & Precautions, NPO status , Patient's Chart, lab work & pertinent test results  Airway Mallampati: II  TM Distance: >3 FB Neck ROM: Full    Dental no notable dental hx. (+) Teeth Intact, Dental Advisory Given   Pulmonary asthma    Pulmonary exam normal breath sounds clear to auscultation       Cardiovascular negative cardio ROS Normal cardiovascular exam Rhythm:Regular Rate:Normal     Neuro/Psych  Headaches PSYCHIATRIC DISORDERS Anxiety        GI/Hepatic negative GI ROS,,,(+) Hepatitis -  Endo/Other  Hypothyroidism    Renal/GU negative Renal ROS  negative genitourinary   Musculoskeletal  (+) Arthritis ,    Abdominal   Peds  Hematology negative hematology ROS (+)   Anesthesia Other Findings   Reproductive/Obstetrics                             Anesthesia Physical Anesthesia Plan  ASA: 2  Anesthesia Plan: General   Post-op Pain Management:    Induction: Intravenous  PONV Risk Score and Plan: 3 and Ondansetron, Dexamethasone and Treatment may vary due to age or medical condition  Airway Management Planned: LMA  Additional Equipment:   Intra-op Plan:   Post-operative Plan: Extubation in OR  Informed Consent: I have reviewed the patients History and Physical, chart, labs and discussed the procedure including the risks, benefits and alternatives for the proposed anesthesia with the patient or authorized representative who has indicated his/her understanding and acceptance.     Dental advisory given  Plan Discussed with: CRNA  Anesthesia Plan Comments:        Anesthesia Quick Evaluation

## 2023-06-07 NOTE — Op Note (Signed)
NAME: Kimberly Reyes MEDICAL RECORD NO: [id number] DATE OF BIRTH: 29-Sep-1955 FACILITY: [facility] LOCATION: [facility] PHYSICIAN: Tami Ribas, MD   OPERATIVE REPORT   DATE OF PROCEDURE: 06/07/23    PREOPERATIVE DIAGNOSIS: Left index finger mucoid cyst and DIP joint arthritis   POSTOPERATIVE DIAGNOSIS: Left index finger mucoid cyst and DIP joint arthritis   PROCEDURE: 1.  Left index finger excision mucoid cyst 2.  Left index finger debridement of DIP joint including osteophyte from middle phalanx   SURGEON:  [name], M.D.   ASSISTANT: none   ANESTHESIA:  General   INTRAVENOUS FLUIDS:  Per anesthesia flow sheet.   ESTIMATED BLOOD LOSS:  Minimal.   COMPLICATIONS:  None.   SPECIMENS: Left index finger mucoid cyst to pathology   TOURNIQUET TIME:    Total Tourniquet Time Documented: Upper Arm (Left) - 16 minutes Total: Upper Arm (Left) - 16 minutes    DISPOSITION:  Stable to PACU.   INDICATIONS: 67 year old female is noted a mucoid cyst on the dorsum of her left index finger.  It is bothersome to her.  She wishes to have it removed and the DIP joint debrided to try to prevent recurrence.  Risks, benefits and alternatives of surgery were discussed including the risks of blood loss, infection, damage to nerves, vessels, tendons, ligaments, bone for surgery, need for additional surgery, complications with wound healing, continued pain, stiffness, , recurrence.  She voiced understanding of these risks and elected to proceed.  OPERATIVE COURSE:  After being identified preoperatively by myself,  the patient and I agreed on the procedure and site of the procedure.  The surgical site was marked.  Surgical consent had been signed. Preoperative IV antibiotic prophylaxis was given. She was transferred to the operating room and placed on the operating table in supine position with the Left upper extremity on an arm board.  General anesthesia was induced by the  anesthesiologist.  Left upper extremity was prepped and draped in normal sterile orthopedic fashion.  A surgical pause was performed between the surgeons, anesthesia, and operating room staff and all were in agreement as to the patient, procedure, and site of procedure.  Tourniquet at the proximal aspect of the extremity was inflated to 250 mmHg after exsanguination of the arm with an Esmarch bandage.  A digital block was performed with quarter percent plain Marcaine to aid in postoperative analgesia.  A hockey-stick shaped incision was made at the dorsum of the DIP joint of the index finger.  This was carried in subcutaneous tissues by spreading technique.  The cyst was identified.  It had deflated.  The cyst was removed.  The DIP joint was entered underneath the extensor tendon.  There was more cystic tissue adjacent to the tendon and within the joint.  This was removed as well.  The cyst was sent to pathology for examination.  The DIP joint was debrided using the rongeurs and curette including an osteophyte from the dorsum of the middle phalanx.  The wound and joint were copiously irrigated with sterile saline.  The wound was closed with 4-0 nylon in a horizontal mattress fashion.  It was dressed with sterile Xeroform 4 x 4 and wrapped with a Coban dressing lightly.  An AlumaFoam splint was placed and wrapped lightly with Coban dressing.  The tourniquet was deflated at 16 minutes.  Fingertips were pink with brisk capillary refill after deflation of tourniquet.  The operative  drapes were broken down.  The patient was awoken  from anesthesia safely.  She was transferred back to the stretcher and taken to PACU in stable condition.  I will see her back in the office in 1 week for postoperative followup.  I will give her a prescription for oxycodone 5 mg 1 p.o. every 6 hours as needed pain dispense #20.   [name], MD Electronically signed, 06/07/23

## 2023-06-07 NOTE — H&P (Signed)
Kimberly Reyes is an 67 y.o. female.   Chief Complaint: mucoid cyst HPI: 67 yo female with mucoid cyst and dip arthritis left index finger.  It is bothersome to her.  She wishes to have the cyst removed and the dip joint debrided to try to prevent recurrence.  Allergies:  Allergies  Allergen Reactions   Tylenol [Acetaminophen] Other (See Comments)    Autoimmune hepatitis     Past Medical History:  Diagnosis Date   Anxiety    Arthritis    Asthma   40 yrs ago   not current probem no inhaler use   Autoimmune hepatitis (HCC)    Headache(784.0)    Hypothyroidism    Pneumonia     Past Surgical History:  Procedure Laterality Date   ABDOMINAL HYSTERECTOMY     20 years ago, complete   ARTHRODESIS METATARSALPHALANGEAL JOINT (MTPJ) Left 01/18/2021   Procedure: LEFT FOOT FIRST METATARSAL PHALANGEAL ARTHRODESIS AND EXCISION OF LEFT ANKLE LIPOMA;  Surgeon: Nadara Mustard, MD;  Location: [facility];  Service: Orthopedics;  Laterality: Left;   CARDIAC CATHETERIZATION     CHOLECYSTECTOMY N/A 11/03/2014   Procedure: LAPAROSCOPIC CHOLECYSTECTOMY WITH INTRAOPERATIVE CHOLANGIOGRAM;  Surgeon: Atilano Ina, MD;  Location: [facility];  Service: General;  Laterality: N/A;   COLONOSCOPY N/A 03/27/2013   Procedure: COLONOSCOPY;  Surgeon: Malissa Hippo, MD;  Location: [facility];  Service: Endoscopy;  Laterality: N/A;  1030   COLONOSCOPY N/A 06/05/2018   Procedure: COLONOSCOPY;  Surgeon: Malissa Hippo, MD;  Location: [facility];  Service: Endoscopy;  Laterality: N/A;  1025   EYE SURGERY     EYE SURGERY Bilateral 05/17/2022   muscles in eyelids   FOOT ARTHRODESIS Left 07/29/2021   Procedure: REMOVAL HARDWARE AND REVISION FUSION LEFT GREAT TOE METATARSOPHALANGEAL JOINT;  Surgeon: Nadara Mustard, MD;  Location: [facility];  Service: Orthopedics;  Laterality: Left;   FOOT ARTHRODESIS Left 09/27/2022   KNEE ARTHROSCOPY W/ MENISCAL REPAIR Right    TUBAL LIGATION     WEIL OSTEOTOMY Left  01/18/2021   Procedure: WEIL OSTEOTOMY LEFT 2ND AND 3RD METATARSAL;  Surgeon: Nadara Mustard, MD;  Location: [facility];  Service: Orthopedics;  Laterality: Left;    Family History: Family History  Problem Relation Age of Onset   Colon cancer Father    CVA Mother    Pneumonia Brother    Cancer Sister        breast, thyroid    Heart disease Sister    Healthy Son     Social History:   reports that she has never smoked. She has never been exposed to tobacco smoke. She has never used smokeless tobacco. She reports that she does not currently use alcohol. She reports that she does not use drugs.  Medications: Medications Prior to Admission  Medication Sig Dispense Refill   Ascorbic Acid (VITAMIN C WITH ROSE HIPS) 1000 MG tablet Take 1,000 mg by mouth daily.     azaTHIOprine (IMURAN) 50 MG tablet Take 50 mg by mouth at bedtime.      Calcium-Magnesium-Vitamin D (CALCIUM 1200+D3 PO) Take 1 tablet by mouth at bedtime.     Cyanocobalamin (VITAMIN B-12) 5000 MCG SUBL Place 5,000 mcg under the tongue at bedtime.      Escitalopram Oxalate (LEXAPRO PO) Take by mouth.     folic acid (FOLVITE) 1 MG tablet Take 1 mg by mouth daily.     Glucosamine HCl (GLUCOSAMINE PO) Take 1  tablet by mouth daily.     L-Theanine 100 MG CAPS Take 100 mg by mouth daily.     levothyroxine (SYNTHROID) 100 MCG tablet Take 112 mcg by mouth daily before breakfast.     levothyroxine (SYNTHROID) 112 MCG tablet Take 112 mcg by mouth every morning.     MAGNESIUM CITRATE PO Take 100 mg by mouth daily.     minoxidil (LONITEN) 10 MG tablet Take 5 mg by mouth daily.     minoxidil (LONITEN) 2.5 MG tablet Take 2.5 mg by mouth daily.     Multiple Vitamin (MULTIVITAMIN) tablet Take 1 tablet by mouth every evening.      Multiple Vitamins-Minerals (HAIR/SKIN/NAILS/BIOTIN PO) Take 3 each by mouth daily.     OVER THE COUNTER MEDICATION Take 2 tablets by mouth daily. Focus factor     predniSONE (DELTASONE) 5 MG tablet  Take 5 mg by mouth daily after breakfast.      Probiotic Product (PROBIOTIC DAILY PO) Take 1 capsule by mouth daily.     Turmeric Curcumin 500 MG CAPS Take 500 mg by mouth every evening.     zolpidem (AMBIEN) 10 MG tablet Take 5 mg by mouth at bedtime as needed for sleep.      No results found for this or any previous visit (from the past 48 hour(s)).  No results found.    Blood pressure 126/68, pulse 72, temperature 98.2 F (36.8 C), temperature source Temporal, resp. rate 18, height 5\' 8"  (1.727 m), weight 64.5 kg, SpO2 100%.  General appearance: alert, cooperative, and appears stated age Head: Normocephalic, without obvious abnormality, atraumatic Neck: supple, symmetrical, trachea midline Extremities: Intact sensation and capillary refill all digits.  +epl/fpl/io.  No wounds.  Pulses: 2+ and symmetric Skin: Skin color, texture, turgor normal. No rashes or lesions Neurologic: Grossly normal Incision/Wound: none  Assessment/Plan Left index finger mucoid cyst and dip joint arthritis.  Non operative and operative treatment options have been discussed with the patient and patient wishes to proceed with operative treatment. Risks, benefits, and alternatives of surgery have been discussed and the patient agrees with the plan of care.   Kimberly Reyes 06/07/2023, 3:35 PM

## 2023-06-07 NOTE — Transfer of Care (Signed)
Immediate Anesthesia Transfer of Care Note  Patient: Kimberly Reyes  Procedure(s) Performed: LEFT INDEX FINGER EXCISION MUCOID CYST AND DEBRIDEMENT DISTAL INTERPHALANGEAL JOINT (Left: Finger)  Patient Location: PACU  Anesthesia Type:General  Level of Consciousness: awake and patient cooperative  Airway & Oxygen Therapy: Patient Spontanous Breathing and Patient connected to face mask oxygen  Post-op Assessment: Report given to RN and Post -op Vital signs reviewed and stable  Post vital signs: Reviewed and stable  Last Vitals:  Vitals Value Taken Time  BP 139/63 06/07/23 1645  Temp    Pulse 88 06/07/23 1647  Resp 13 06/07/23 1647  SpO2 99 % 06/07/23 1647  Vitals shown include unfiled device data.  Last Pain:  Vitals:   06/07/23 1236  TempSrc: Temporal  PainSc: 0-No pain      Patients Stated Pain Goal: 0 (06/07/23 1236)  Complications: No notable events documented.

## 2023-06-07 NOTE — Discharge Instructions (Addendum)

## 2023-06-08 ENCOUNTER — Encounter (HOSPITAL_BASED_OUTPATIENT_CLINIC_OR_DEPARTMENT_OTHER): Payer: Self-pay | Admitting: Orthopedic Surgery

## 2023-06-08 NOTE — Anesthesia Postprocedure Evaluation (Signed)
Anesthesia Post Note  Patient: Kimberly Reyes  Procedure(s) Performed: LEFT INDEX FINGER EXCISION MUCOID CYST AND DEBRIDEMENT DISTAL INTERPHALANGEAL JOINT (Left: Finger)     Patient location during evaluation: PACU Anesthesia Type: General Level of consciousness: awake and alert Pain management: pain level controlled Vital Signs Assessment: post-procedure vital signs reviewed and stable Respiratory status: spontaneous breathing, nonlabored ventilation, respiratory function stable and patient connected to nasal cannula oxygen Cardiovascular status: blood pressure returned to baseline and stable Postop Assessment: no apparent nausea or vomiting Anesthetic complications: no  No notable events documented.  Last Vitals:  Vitals:   06/07/23 1710 06/07/23 1721  BP: 133/69 130/76  Pulse: 87 88  Resp: 16 18  Temp:  (!) 36.1 C  SpO2: 96% 100%    Last Pain:  Vitals:   06/07/23 1721  TempSrc: Temporal  PainSc: 0-No pain   Pain Goal: Patients Stated Pain Goal: 0 (06/07/23 1236)                 Kimberly Reyes

## 2023-06-11 LAB — SURGICAL PATHOLOGY

## 2023-06-12 DIAGNOSIS — C4491 Basal cell carcinoma of skin, unspecified: Secondary | ICD-10-CM

## 2023-06-12 HISTORY — PX: BASAL CELL CARCINOMA EXCISION: SHX1214

## 2023-06-12 HISTORY — DX: Basal cell carcinoma of skin, unspecified: C44.91

## 2023-06-13 ENCOUNTER — Ambulatory Visit: Payer: Medicare Other | Attending: Physician Assistant | Admitting: Physician Assistant

## 2023-06-13 ENCOUNTER — Encounter: Payer: Self-pay | Admitting: Physician Assistant

## 2023-06-13 VITALS — BP 122/72 | HR 66 | Resp 14 | Ht 68.0 in | Wt 145.2 lb

## 2023-06-13 DIAGNOSIS — M19071 Primary osteoarthritis, right ankle and foot: Secondary | ICD-10-CM | POA: Diagnosis present

## 2023-06-13 DIAGNOSIS — M7918 Myalgia, other site: Secondary | ICD-10-CM | POA: Diagnosis not present

## 2023-06-13 DIAGNOSIS — M1611 Unilateral primary osteoarthritis, right hip: Secondary | ICD-10-CM | POA: Insufficient documentation

## 2023-06-13 DIAGNOSIS — M170 Bilateral primary osteoarthritis of knee: Secondary | ICD-10-CM | POA: Insufficient documentation

## 2023-06-13 DIAGNOSIS — Z79899 Other long term (current) drug therapy: Secondary | ICD-10-CM | POA: Diagnosis present

## 2023-06-13 DIAGNOSIS — M19072 Primary osteoarthritis, left ankle and foot: Secondary | ICD-10-CM | POA: Insufficient documentation

## 2023-06-13 DIAGNOSIS — Z7952 Long term (current) use of systemic steroids: Secondary | ICD-10-CM | POA: Diagnosis not present

## 2023-06-13 DIAGNOSIS — K754 Autoimmune hepatitis: Secondary | ICD-10-CM | POA: Insufficient documentation

## 2023-06-13 DIAGNOSIS — M8589 Other specified disorders of bone density and structure, multiple sites: Secondary | ICD-10-CM | POA: Diagnosis present

## 2023-06-13 DIAGNOSIS — M7061 Trochanteric bursitis, right hip: Secondary | ICD-10-CM | POA: Insufficient documentation

## 2023-06-13 DIAGNOSIS — E559 Vitamin D deficiency, unspecified: Secondary | ICD-10-CM | POA: Diagnosis present

## 2023-06-13 DIAGNOSIS — M7062 Trochanteric bursitis, left hip: Secondary | ICD-10-CM | POA: Diagnosis present

## 2023-06-13 DIAGNOSIS — Z8639 Personal history of other endocrine, nutritional and metabolic disease: Secondary | ICD-10-CM | POA: Insufficient documentation

## 2023-06-13 DIAGNOSIS — M205X2 Other deformities of toe(s) (acquired), left foot: Secondary | ICD-10-CM | POA: Insufficient documentation

## 2023-06-13 DIAGNOSIS — M19042 Primary osteoarthritis, left hand: Secondary | ICD-10-CM | POA: Diagnosis present

## 2023-06-13 DIAGNOSIS — M19041 Primary osteoarthritis, right hand: Secondary | ICD-10-CM | POA: Diagnosis present

## 2023-06-13 DIAGNOSIS — M40294 Other kyphosis, thoracic region: Secondary | ICD-10-CM | POA: Diagnosis present

## 2023-06-13 NOTE — Patient Instructions (Signed)
Hip Bursitis Rehab Ask your health care provider which exercises are safe for you. Do exercises exactly as told by your health care provider and adjust them as directed. It is normal to feel mild stretching, pulling, tightness, or discomfort as you do these exercises. Stop right away if you feel sudden pain or your pain gets worse. Do not begin these exercises until told by your health care provider. Stretching exercise This exercise warms up your muscles and joints and improves the movement and flexibility of your hip. This exercise also helps to relieve pain and stiffness. Iliotibial band stretch An iliotibial band is a strong band of muscle tissue that runs from the outer side of your hip to the outer side of your thigh and knee. Lie on your side with your left / right leg in the top position. Bend your left / right knee and grab your ankle. Stretch out your bottom arm to help you balance. Slowly bring your knee back so your thigh is slightly behind your body. Slowly lower your knee toward the floor until you feel a gentle stretch on the outside of your left / right thigh. If you do not feel a stretch and your knee will not lower more toward the floor, place the heel of your other foot on top of your knee and pull your knee down toward the floor with your foot. Hold this position for __________ seconds. Slowly return to the starting position. Repeat __________ times. Complete this exercise __________ times a day. Strengthening exercises These exercises build strength and endurance in your hip and pelvis. Endurance is the ability to use your muscles for a long time, even after they get tired. Bridge This exercise strengthens the muscles that move your thigh backward (hip extensors). Lie on your back on a firm surface with your knees bent and your feet flat on the floor. Tighten your buttocks muscles and lift your buttocks off the floor until your trunk is level with your thighs. Do not arch your  back. You should feel the muscles working in your buttocks and the back of your thighs. If you do not feel these muscles, slide your feet 1-2 inches (2.5-5 cm) farther away from your buttocks. If this exercise is too easy, try doing it with your arms crossed over your chest. Hold this position for __________ seconds. Slowly lower your hips to the starting position. Let your muscles relax completely after each repetition. Repeat __________ times. Complete this exercise __________ times a day. Squats This exercise strengthens the muscles in front of your thigh and knee (quadriceps). Stand in front of a table, with your feet and knees pointing straight ahead. You may rest your hands on the table for balance but not for support. Slowly bend your knees and lower your hips like you are going to sit in a chair. Keep your weight over your heels, not over your toes. Keep your lower legs upright so they are parallel with the table legs. Do not let your hips go lower than your knees. Do not bend lower than told by your health care provider. If your hip pain increases, do not bend as low. Hold the squat position for __________ seconds. Slowly push with your legs to return to standing. Do not use your hands to pull yourself to standing. Repeat __________ times. Complete this exercise __________ times a day. Hip hike  Stand sideways on a bottom step. Stand on your left / right leg with your other foot unsupported next to   the step. You can hold on to the railing or wall for balance if needed. Keep your knees straight and your torso square. Then lift your left / right hip up toward the ceiling. Hold this position for __________ seconds. Slowly let your left / right hip lower toward the floor, past the starting position. Your foot should get closer to the floor. Do not lean or bend your knees. Repeat __________ times. Complete this exercise __________ times a day. Single leg stand This exercise increases  your balance. Without shoes, stand near a railing or in a doorway. You may hold on to the railing or door frame as needed for balance. Squeeze your left / right buttock muscles, then lift up your other foot. Do not let your left / right hip push out to the side. It is helpful to stand in front of a mirror for this exercise so you can watch your hip. Hold this position for __________ seconds. Repeat __________ times. Complete this exercise __________ times a day. This information is not intended to replace advice given to you by your health care provider. Make sure you discuss any questions you have with your health care provider. Document Revised: 08/10/2021 Document Reviewed: 08/10/2021 Elsevier Patient Education  2024 Elsevier Inc.  

## 2023-06-14 LAB — COMPLETE METABOLIC PANEL WITH GFR
AG Ratio: 2.1 (calc) (ref 1.0–2.5)
ALT: 13 U/L (ref 6–29)
AST: 20 U/L (ref 10–35)
Albumin: 4.4 g/dL (ref 3.6–5.1)
Alkaline phosphatase (APISO): 78 U/L (ref 37–153)
BUN: 22 mg/dL (ref 7–25)
CO2: 28 mmol/L (ref 20–32)
Calcium: 9.6 mg/dL (ref 8.6–10.4)
Chloride: 99 mmol/L (ref 98–110)
Creat: 0.85 mg/dL (ref 0.50–1.05)
Globulin: 2.1 g/dL (ref 1.9–3.7)
Glucose, Bld: 99 mg/dL (ref 65–99)
Potassium: 4.2 mmol/L (ref 3.5–5.3)
Sodium: 136 mmol/L (ref 135–146)
Total Bilirubin: 0.4 mg/dL (ref 0.2–1.2)
Total Protein: 6.5 g/dL (ref 6.1–8.1)
eGFR: 74 mL/min/{1.73_m2} (ref >=60)

## 2023-06-14 LAB — CBC WITH DIFFERENTIAL/PLATELET
Absolute Monocytes: 1102 {cells}/uL — ABNORMAL HIGH (ref 200–950)
Basophils Absolute: 82 {cells}/uL (ref 0–200)
Basophils Relative: 0.8 %
Eosinophils Absolute: 326 {cells}/uL (ref 15–500)
Eosinophils Relative: 3.2 %
HCT: 36.7 % (ref 35.0–45.0)
Hemoglobin: 11.6 g/dL — ABNORMAL LOW (ref 11.7–15.5)
Lymphs Abs: 2428 {cells}/uL (ref 850–3900)
MCH: 30 pg (ref 27.0–33.0)
MCHC: 31.6 g/dL — ABNORMAL LOW (ref 32.0–36.0)
MCV: 94.8 fL (ref 80.0–100.0)
MPV: 11.8 fL (ref 7.5–12.5)
Monocytes Relative: 10.8 %
Neutro Abs: 6263 {cells}/uL (ref 1500–7800)
Neutrophils Relative %: 61.4 %
Platelets: 216 10*3/uL (ref 140–400)
RBC: 3.87 10*6/uL (ref 3.80–5.10)
RDW: 11.8 % (ref 11.0–15.0)
Total Lymphocyte: 23.8 %
WBC: 10.2 10*3/uL (ref 3.8–10.8)

## 2023-06-14 NOTE — Progress Notes (Signed)
CMP WNL. CBC stable.

## 2023-06-14 NOTE — Progress Notes (Signed)
Please forward results to Dr. Elnoria Howard as discussed yesterday

## 2023-06-15 ENCOUNTER — Telehealth (INDEPENDENT_AMBULATORY_CARE_PROVIDER_SITE_OTHER): Payer: Self-pay | Admitting: Gastroenterology

## 2023-06-15 NOTE — Telephone Encounter (Signed)
Who is your primary care physician: Dr.Golding [facility]  Reasons for the colonoscopy: 5 year recall  Have you had a colonoscopy before?  Yes 4 years ago  Do you have family history of colon cancer? Yes father  Previous colonoscopy with polyps removed? yes  Do you have a history colorectal cancer?   no  Are you diabetic? If yes, Type 1 or Type 2?    no  Do you have a prosthetic or mechanical heart valve? no  Do you have a pacemaker/defibrillator?   no  Have you had endocarditis/atrial fibrillation? no  Have you had joint replacement within the last 12 months?  no  Do you tend to be constipated or have to use laxatives? no  Do you have any history of drugs or alchohol?  no  Do you use supplemental oxygen?  no  Have you had a stroke or heart attack within the last 6 months? no  Do you take weight loss medication?  no  For female patients: have you had a hysterectomy?  yes                                     are you post menopausal?       yes                                            do you still have your menstrual cycle? no      Do you take any blood-thinning medications such as: (aspirin, warfarin, Plavix, Aggrenox)  no  If yes we need the name, milligram, dosage and who is prescribing doctor  Current Outpatient Medications on File Prior to Visit  Medication Sig Dispense Refill   Ascorbic Acid (VITAMIN C WITH ROSE HIPS) 1000 MG tablet Take 1,000 mg by mouth daily.     azaTHIOprine (IMURAN) 50 MG tablet Take 50 mg by mouth at bedtime.      Calcium-Magnesium-Vitamin D (CALCIUM 1200+D3 PO) Take 1 tablet by mouth at bedtime.     Cyanocobalamin (VITAMIN B-12) 5000 MCG SUBL Place 5,000 mcg under the tongue at bedtime.      Escitalopram Oxalate (LEXAPRO PO) Take by mouth.     folic acid (FOLVITE) 1 MG tablet Take 1 mg by mouth daily.     Glucosamine HCl (GLUCOSAMINE PO) Take 1 tablet by mouth daily.     L-Theanine 100 MG CAPS Take 100 mg by mouth daily.      levothyroxine (SYNTHROID) 112 MCG tablet Take 112 mcg by mouth every morning.     MAGNESIUM CITRATE PO Take 100 mg by mouth daily.     minoxidil (LONITEN) 2.5 MG tablet Take 2.5 mg by mouth daily.     Multiple Vitamin (MULTIVITAMIN) tablet Take 1 tablet by mouth every evening.      Multiple Vitamins-Minerals (HAIR/SKIN/NAILS/BIOTIN PO) Take 3 each by mouth daily.     OVER THE COUNTER MEDICATION Take 2 tablets by mouth daily. Focus factor     predniSONE (DELTASONE) 5 MG tablet Take 5 mg by mouth daily after breakfast.      Probiotic Product (PROBIOTIC DAILY PO) Take 1 capsule by mouth daily.     Turmeric Curcumin 500 MG CAPS Take 500 mg by mouth every evening.     zolpidem (AMBIEN) 10 MG tablet  Take 10 mg by mouth at bedtime.     No current facility-administered medications on file prior to visit.    Allergies  Allergen Reactions   Tylenol [Acetaminophen] Other (See Comments)    Autoimmune hepatitis      [facility]: [facility]  Primary Insurance Name: Medicare  Best number where you can be reached: [phone]

## 2023-06-18 NOTE — Telephone Encounter (Signed)
Ok to schedule.  Room :Any   Thanks,  Vista Lawman, MD Gastroenterology and Hepatology [facility]

## 2023-07-04 NOTE — Telephone Encounter (Signed)
Left message for pt to return call.

## 2023-07-23 MED ORDER — CLENPIQ 10-3.5-12 MG-GM -GM/175ML PO SOLN: 1.0000 | ORAL | 0 refills | Status: AC

## 2023-07-23 NOTE — Addendum Note (Signed)
Addended by: Marlowe Shores on: 07/23/2023 11:03 AM   Modules accepted: Orders

## 2023-07-23 NOTE — Telephone Encounter (Signed)
Questionnaire from recall, no referral needed  

## 2023-07-23 NOTE — Telephone Encounter (Signed)
Pt returned call to schedule TCS. Pt scheduled for 08/24/23. Instructions will be sent once pre op is received. No pa needed per insurance.

## 2023-07-27 ENCOUNTER — Telehealth (INDEPENDENT_AMBULATORY_CARE_PROVIDER_SITE_OTHER): Payer: Self-pay | Admitting: Gastroenterology

## 2023-07-27 NOTE — Telephone Encounter (Signed)
Fax from [employer] stating insurance prefers [employer] [employer]. Contacted [facility] and cash price for [employer] is $210; with good rx generic coupon cost is $194. I have sent email to Darl Pikes to see if we can get Clenpiq samples. Will call pt once I hear from [place]. If unable to get sample, will call pt to let her know that we would need to send in [place].

## 2023-08-07 NOTE — Telephone Encounter (Signed)
Receive Clenpiq samples. Left message to let pt know I have her a sample up front.

## 2023-08-08 ENCOUNTER — Telehealth (INDEPENDENT_AMBULATORY_CARE_PROVIDER_SITE_OTHER): Payer: Self-pay

## 2023-08-22 ENCOUNTER — Other Ambulatory Visit: Payer: Self-pay

## 2023-08-22 ENCOUNTER — Encounter (HOSPITAL_COMMUNITY): Payer: Self-pay

## 2023-08-22 ENCOUNTER — Encounter (HOSPITAL_COMMUNITY)
Admission: RE | Admit: 2023-08-22 | Discharge: 2023-08-22 | Disposition: A | Discharge status: Home / Self Care | Payer: Medicare Other | Source: Ambulatory Visit | Attending: Gastroenterology | Admitting: Gastroenterology

## 2023-08-22 NOTE — Telephone Encounter (Signed)
Error

## 2023-08-24 ENCOUNTER — Ambulatory Visit (HOSPITAL_COMMUNITY)
Admission: RE | Admit: 2023-08-24 | Discharge: 2023-08-24 | Disposition: A | Discharge status: Home / Self Care | Payer: Medicare Other | Source: Ambulatory Visit | Attending: Gastroenterology | Admitting: Gastroenterology

## 2023-08-24 ENCOUNTER — Encounter (HOSPITAL_COMMUNITY)
Admission: RE | Disposition: A | Discharge status: Home / Self Care | Payer: Self-pay | Source: Ambulatory Visit | Attending: Gastroenterology

## 2023-08-24 ENCOUNTER — Ambulatory Visit (HOSPITAL_COMMUNITY): Payer: Medicare Other | Admitting: Anesthesiology

## 2023-08-24 ENCOUNTER — Encounter (HOSPITAL_COMMUNITY): Payer: Self-pay

## 2023-08-24 DIAGNOSIS — K5730 Diverticulosis of large intestine without perforation or abscess without bleeding: Secondary | ICD-10-CM | POA: Diagnosis not present

## 2023-08-24 DIAGNOSIS — Z800 Family history of malignant neoplasm of digestive organs: Secondary | ICD-10-CM

## 2023-08-24 DIAGNOSIS — K648 Other hemorrhoids: Secondary | ICD-10-CM | POA: Insufficient documentation

## 2023-08-24 DIAGNOSIS — Z8249 Family history of ischemic heart disease and other diseases of the circulatory system: Secondary | ICD-10-CM | POA: Diagnosis not present

## 2023-08-24 DIAGNOSIS — J45909 Unspecified asthma, uncomplicated: Secondary | ICD-10-CM | POA: Insufficient documentation

## 2023-08-24 DIAGNOSIS — I10 Essential (primary) hypertension: Secondary | ICD-10-CM | POA: Insufficient documentation

## 2023-08-24 DIAGNOSIS — Z1211 Encounter for screening for malignant neoplasm of colon: Secondary | ICD-10-CM | POA: Insufficient documentation

## 2023-08-24 DIAGNOSIS — K644 Residual hemorrhoidal skin tags: Secondary | ICD-10-CM | POA: Diagnosis not present

## 2023-08-24 HISTORY — PX: COLONOSCOPY WITH PROPOFOL: SHX5780

## 2023-08-24 LAB — HM COLONOSCOPY

## 2023-08-24 SURGERY — COLONOSCOPY WITH PROPOFOL
Anesthesia: General

## 2023-08-24 MED ORDER — LIDOCAINE HCL (CARDIAC) PF 100 MG/5ML IV SOSY
PREFILLED_SYRINGE | INTRAVENOUS | Status: DC | PRN
  Administered 2023-08-24: 50 mg via INTRATRACHEAL

## 2023-08-24 MED ORDER — LACTATED RINGERS IV SOLN: INTRAVENOUS | Status: DC

## 2023-08-24 MED ORDER — PROPOFOL 500 MG/50ML IV EMUL
INTRAVENOUS | Status: DC | PRN
  Administered 2023-08-24: 50 mg via INTRAVENOUS
  Administered 2023-08-24: 125 ug/kg/min via INTRAVENOUS
  Administered 2023-08-24: 100 mg via INTRAVENOUS

## 2023-08-24 NOTE — Discharge Instructions (Signed)

## 2023-08-24 NOTE — H&P (Addendum)
Primary Care Physician:  Assunta Found, MD Primary Gastroenterologist:  Dr. Tasia Catchings  Pre-Procedure History & Physical: HPI:  Kimberly Reyes is a 67 y.o. female is here for a colonoscopy for colon cancer screening purposes.    No melena or hematochezia.  No abdominal pain or unintentional weight loss.  No change in bowel habits.  Overall feels well from a GI standpoint.  History significant for CRC in her father who was 77 at the time of diagnosis.   Last colonoscopy by Dr Karilyn Cota  normal Suggested repeat 5 years   Past Medical History:  Diagnosis Date   Anxiety    Arthritis    Asthma   40 yrs ago   not current probem no inhaler use   Autoimmune hepatitis (HCC)    Headache(784.0)    Hypothyroidism    Pneumonia     Past Surgical History:  Procedure Laterality Date   ABDOMINAL HYSTERECTOMY     20 years ago, complete   ARTHRODESIS METATARSALPHALANGEAL JOINT (MTPJ) Left 01/18/2021   Procedure: LEFT FOOT FIRST METATARSAL PHALANGEAL ARTHRODESIS AND EXCISION OF LEFT ANKLE LIPOMA;  Surgeon: Nadara Mustard, MD;  Location: [facility];  Service: Orthopedics;  Laterality: Left;   CARDIAC CATHETERIZATION     CHOLECYSTECTOMY N/A 11/03/2014   Procedure: LAPAROSCOPIC CHOLECYSTECTOMY WITH INTRAOPERATIVE CHOLANGIOGRAM;  Surgeon: Atilano Ina, MD;  Location: [facility];  Service: General;  Laterality: N/A;   COLONOSCOPY N/A 03/27/2013   Procedure: COLONOSCOPY;  Surgeon: Malissa Hippo, MD;  Location: [facility];  Service: Endoscopy;  Laterality: N/A;  1030   COLONOSCOPY N/A 06/05/2018   Procedure: COLONOSCOPY;  Surgeon: Malissa Hippo, MD;  Location: [facility];  Service: Endoscopy;  Laterality: N/A;  1025   CYST EXCISION Left 06/07/2023   Procedure: LEFT INDEX FINGER EXCISION MUCOID CYST AND DEBRIDEMENT DISTAL INTERPHALANGEAL JOINT;  Surgeon: [name], MD;  Location: [facility];  Service: Orthopedics;  Laterality: Left;   EYE SURGERY     EYE SURGERY  Bilateral 05/17/2022   muscles in eyelids   FOOT ARTHRODESIS Left 07/29/2021   Procedure: REMOVAL HARDWARE AND REVISION FUSION LEFT GREAT TOE METATARSOPHALANGEAL JOINT;  Surgeon: Nadara Mustard, MD;  Location: [facility];  Service: Orthopedics;  Laterality: Left;   FOOT ARTHRODESIS Left 09/27/2022   KNEE ARTHROSCOPY W/ MENISCAL REPAIR Right    TUBAL LIGATION     WEIL OSTEOTOMY Left 01/18/2021   Procedure: WEIL OSTEOTOMY LEFT 2ND AND 3RD METATARSAL;  Surgeon: Nadara Mustard, MD;  Location: [facility];  Service: Orthopedics;  Laterality: Left;    Prior to Admission medications   Medication Sig Start Date End Date Taking? Authorizing Provider  Ascorbic Acid (VITAMIN C WITH ROSE HIPS) 1000 MG tablet Take 1,000 mg by mouth daily.   Yes [provider]  azaTHIOprine (IMURAN) 50 MG tablet Take 50 mg by mouth at bedtime.    Yes [provider]  Calcium-Magnesium-Vitamin D (CALCIUM 1200+D3 PO) Take 1 tablet by mouth at bedtime.   Yes [provider]  Cyanocobalamin (VITAMIN B-12) 5000 MCG SUBL Place 5,000 mcg under the tongue at bedtime.    Yes [provider]  Escitalopram Oxalate (LEXAPRO PO) Take by mouth.   Yes [provider]  folic acid (FOLVITE) 1 MG tablet Take 1 mg by mouth daily.   Yes [provider]  Glucosamine HCl (GLUCOSAMINE PO) Take 1 tablet by mouth daily.   Yes [provider]  L-Theanine 100 MG  CAPS Take 100 mg by mouth daily.   Yes [provider]  levothyroxine (SYNTHROID) 112 MCG tablet Take 112 mcg by mouth every morning. 11/25/22  Yes [provider]  MAGNESIUM CITRATE PO Take 100 mg by mouth daily.   Yes [provider]  minoxidil (LONITEN) 2.5 MG tablet Take 2.5 mg by mouth daily.   Yes [provider]  Multiple Vitamin (MULTIVITAMIN) tablet Take 1 tablet by mouth every evening.    Yes [provider]  Multiple Vitamins-Minerals (HAIR/SKIN/NAILS/BIOTIN PO)  Take 3 each by mouth daily.   Yes [provider]  OVER THE COUNTER MEDICATION Take 2 tablets by mouth daily. Focus factor   Yes [provider]  predniSONE (DELTASONE) 5 MG tablet Take 5 mg by mouth daily after breakfast.  07/27/17  Yes [provider]  Probiotic Product (PROBIOTIC DAILY PO) Take 1 capsule by mouth daily.   Yes [provider]  Sod Picosulfate-Mag Ox-Cit Acd (CLENPIQ) 10-3.5-12 MG-GM -GM/175ML SOLN Take 1 kit by mouth as directed. 07/23/23  Yes Laylonie Marzec, Juanetta Beets, MD  Turmeric Curcumin 500 MG CAPS Take 500 mg by mouth every evening.   Yes [provider]  zolpidem (AMBIEN) 10 MG tablet Take 10 mg by mouth at bedtime. 05/12/17  Yes [provider]    Allergies as of 07/23/2023 - Review Complete 06/15/2023  Allergen Reaction Noted   Tylenol [acetaminophen] Other (See Comments) 07/29/2021    Family History  Problem Relation Age of Onset   Colon cancer Father    CVA Mother    Pneumonia Brother    Cancer Sister        breast, thyroid    Heart disease Sister    Healthy Son     Social History   Socioeconomic History   Marital status: Married    Spouse name: Not on file   Number of children: Not on file   Years of education: Not on file   Highest education level: Not on file  Occupational History   Not on file  Tobacco Use   Smoking status: Never    Passive exposure: Never   Smokeless tobacco: Never  Vaping Use   Vaping status: Never Used  Substance and Sexual Activity   Alcohol use: Not Currently   Drug use: Never   Sexual activity: Not on file    Comment: hyst  Other Topics Concern   Not on file  Social History Narrative   Not on file   Social Drivers of Health   Financial Resource Strain: Not on file  Food Insecurity: Not on file  Transportation Needs: Not on file  Physical Activity: Not on file  Stress: Not on file  Social Connections: Not on file  Intimate Partner Violence: Not on file     Review of Systems: See HPI, otherwise negative ROS  Physical Exam: Vital signs in last 24 hours: Temp:  [98.2 F (36.8 C)] 98.2 F (36.8 C) (12/13 0759) Pulse Rate:  [70] 70 (12/13 0759) Resp:  [15] 15 (12/13 0759) BP: (149)/(75) 149/75 (12/13 0759) SpO2:  [100 %] 100 % (12/13 0759) Weight:  [56.9 kg] 56.9 kg (12/13 0759)   General:   Alert,  Well-developed, well-nourished, pleasant and cooperative in NAD Head:  Normocephalic and atraumatic. Eyes:  Sclera clear, no icterus.   Conjunctiva pink. Ears:  Normal auditory acuity. Nose:  No deformity, discharge,  or lesions. Msk:  Symmetrical without gross deformities. Normal posture. Extremities:  Without clubbing or edema. Neurologic:  Alert and  oriented x4;  grossly normal neurologically. Skin:  Intact without significant lesions or rashes. Psych:  Alert and cooperative. Normal mood and affect.  Impression/Plan: Kimberly Reyes is here for a colonoscopy to be performed for colon cancer screening purposes.  The risks of the procedure including infection, bleed, or perforation as well as benefits, limitations, alternatives and imponderables have been reviewed with the patient. Questions have been answered. All parties agreeable.

## 2023-08-24 NOTE — Anesthesia Postprocedure Evaluation (Signed)
Anesthesia Post Note  Patient: Kimberly Reyes  Procedure(s) Performed: COLONOSCOPY WITH PROPOFOL  Patient location during evaluation: Short Stay Anesthesia Type: General Level of consciousness: awake and alert Pain management: pain level controlled Vital Signs Assessment: post-procedure vital signs reviewed and stable Respiratory status: spontaneous breathing Cardiovascular status: blood pressure returned to baseline and stable Postop Assessment: no apparent nausea or vomiting Anesthetic complications: no   No notable events documented.   Last Vitals:  Vitals:   08/24/23 0759  BP: (!) 149/75  Pulse: 70  Resp: 15  Temp: 36.8 C  SpO2: 100%    Last Pain:  Vitals:   08/24/23 0903  TempSrc:   PainSc: 0-No pain                 Amiya Escamilla

## 2023-08-24 NOTE — Anesthesia Preprocedure Evaluation (Signed)
Anesthesia Evaluation  Patient identified by MRN, date of birth, ID band Patient awake    Reviewed: Allergy & Precautions, H&P , NPO status , Patient's Chart, lab work & pertinent test results, reviewed documented beta blocker date and time   Airway Mallampati: II  TM Distance: >3 FB Neck ROM: full    Dental no notable dental hx.    Pulmonary neg pulmonary ROS, asthma , pneumonia   Pulmonary exam normal breath sounds clear to auscultation       Cardiovascular Exercise Tolerance: Good hypertension, negative cardio ROS  Rhythm:regular Rate:Normal     Neuro/Psych  Headaches  Anxiety     negative neurological ROS  negative psych ROS   GI/Hepatic negative GI ROS, Neg liver ROS,,,(+) Hepatitis -  Endo/Other  negative endocrine ROSHypothyroidism    Renal/GU negative Renal ROS  negative genitourinary   Musculoskeletal   Abdominal   Peds  Hematology negative hematology ROS (+)   Anesthesia Other Findings   Reproductive/Obstetrics negative OB ROS                             Anesthesia Physical Anesthesia Plan  ASA: 2  Anesthesia Plan: General   Post-op Pain Management:    Induction:   PONV Risk Score and Plan: Propofol infusion  Airway Management Planned:   Additional Equipment:   Intra-op Plan:   Post-operative Plan:   Informed Consent: I have reviewed the patients History and Physical, chart, labs and discussed the procedure including the risks, benefits and alternatives for the proposed anesthesia with the patient or authorized representative who has indicated his/her understanding and acceptance.     Dental Advisory Given  Plan Discussed with: CRNA  Anesthesia Plan Comments:        Anesthesia Quick Evaluation

## 2023-08-24 NOTE — Op Note (Signed)
[facility] Patient Name: Kimberly Reyes Procedure Date: 08/24/2023 8:45 AM MRN: [id number] Date of Birth: 1956-07-04 Attending MD: Sanjuan Dame , MD, [id number] CSN: [id number] Age: 67 Admit Type: Outpatient Procedure:                Colonoscopy Indications:              Screening in patient at increased risk: Family                            history of 1st-degree relative with colorectal                            cancer before age 53 years Providers:                Sanjuan Dame, MD, Francoise Ceo RN, RN, Zena Amos Referring MD:              Medicines:                Monitored Anesthesia Care Complications:            No immediate complications. Estimated Blood Loss:     Estimated blood loss: none. Procedure:                Pre-Anesthesia Assessment:                           - Prior to the procedure, a History and Physical                            was performed, and patient medications and                            allergies were reviewed. The patient's tolerance of                            previous anesthesia was also reviewed. The risks                            and benefits of the procedure and the sedation                            options and risks were discussed with the patient.                            All questions were answered, and informed consent                            was obtained. Prior Anticoagulants: The patient has                            taken no anticoagulant or antiplatelet agents                            except  for aspirin. ASA Grade Assessment: III - A                            patient with severe systemic disease. After                            reviewing the risks and benefits, the patient was                            deemed in satisfactory condition to undergo the                            procedure.                           After obtaining informed consent, the colonoscope                             was passed under direct vision. Throughout the                            procedure, the patient's blood pressure, pulse, and                            oxygen saturations were monitored continuously. The                            [id number] ([id number]) was introduced through the                            anus and advanced to the the cecum, identified by                            appendiceal orifice and ileocecal valve. The                            ileocecal valve, appendiceal orifice, and rectum                            were photographed. The quality of the bowel                            preparation was evaluated using the BBPS [facility]                            Bowel Preparation Scale) with scores of: Right                            Colon = 3 (entire mucosa seen well with no residual                            staining, small fragments of stool or opaque  liquid), Transverse Colon = 3 (entire mucosa seen                            well with no residual staining, small fragments of                            stool or opaque liquid) and Left Colon = 2 (minor                            amount of residual staining, small fragments of                            stool and/or opaque liquid, but mucosa seen well).                            The total BBPS score equals 8. The colonoscopy was                            technically difficult and complex due to restricted                            mobility of the colon and significant looping.                            Previous procedures were challenging due to                            restricted mobility and significanty looping hence                            procedure was start having patient supine with                            abdominal binder in place . Scope In: 9:08:02 AM Scope Out: 9:31:41 AM Scope Withdrawal Time: 0 hours 14 minutes 31 seconds  Total Procedure Duration: 0 hours 23  minutes 39 seconds  Findings:      A few small-mouthed diverticula were found in the left colon.      Non-bleeding external and internal hemorrhoids were found during       retroflexion. The hemorrhoids were small. Impression:               - Diverticulosis in the left colon.                           - Non-bleeding external and internal hemorrhoids.                           - No specimens collected. Moderate Sedation:      Per Anesthesia Care Recommendation:           - Patient has a contact number available for                            emergencies. The signs and symptoms of potential  delayed complications were discussed with the                            patient. Return to normal activities tomorrow.                            Written discharge instructions were provided to the                            patient.                           - Resume previous diet.                           - Continue present medications.                           - Await pathology results.                           - Repeat colonoscopy in 5 years for screening                            purposes. Family history of premature CRC                           - Return to primary care physician as previously                            scheduled. Procedure Code(s):        --- Professional ---                           X3244, Colorectal cancer screening; colonoscopy on                            individual at high risk Diagnosis Code(s):        --- Professional ---                           Z80.0, Family history of malignant neoplasm of                            digestive organs                           K64.8, Other hemorrhoids                           K57.30, Diverticulosis of large intestine without                            perforation or abscess without bleeding CPT copyright 2022 [employer]. All rights reserved. The codes documented in this report are  preliminary and upon coder review may  be revised to meet current compliance requirements. Sanjuan Dame, MD Sanjuan Dame, MD 08/24/2023  9:39:05 AM This report has been signed electronically. Number of Addenda: 0

## 2023-08-24 NOTE — Transfer of Care (Signed)
Immediate Anesthesia Transfer of Care Note  Patient: Kimberly Reyes  Procedure(s) Performed: COLONOSCOPY WITH PROPOFOL  Patient Location: Short Stay  Anesthesia Type:General  Level of Consciousness: awake  Airway & Oxygen Therapy: Patient Spontanous Breathing  Post-op Assessment: Report given to RN  Post vital signs: Reviewed and stable  Last Vitals:  Vitals Value Taken Time  BP    Temp    Pulse    Resp    SpO2      Last Pain:  Vitals:   08/24/23 0903  TempSrc:   PainSc: 0-No pain         Complications: No notable events documented.

## 2023-08-27 ENCOUNTER — Encounter (INDEPENDENT_AMBULATORY_CARE_PROVIDER_SITE_OTHER): Payer: Self-pay | Admitting: *Deleted

## 2023-08-30 ENCOUNTER — Encounter (HOSPITAL_COMMUNITY): Payer: Self-pay | Admitting: Gastroenterology

## 2023-10-31 NOTE — Progress Notes (Signed)
 Office Visit Note  Patient: Kimberly Reyes             Date of Birth: [birth year]           MRN: [id number]             PCP: Assunta Found, MD Referring: Assunta Found, MD Visit Date: 11/14/2023 Occupation: @GUAROCC @  Subjective:  Medication management  History of Present Illness: Kimberly Reyes is a 68 y.o. female with autoimmune hepatitis, osteoarthritis and myofascial pain syndrome.  She is followed by Dr. Elnoria Howard for autoimmune hepatitis and remains on Imuran 50 mg p.o. daily with prednisone 5 mg p.o. daily.  She continues to have some pain and discomfort in the bilateral hands and her hips.  She describes over the trochanteric region.    Activities of Daily Living:  Patient reports morning stiffness for 20-30 minutes.   Patient Reports nocturnal pain.  Difficulty dressing/grooming: Denies Difficulty climbing stairs: Reports Difficulty getting out of chair: Denies Difficulty using hands for taps, buttons, cutlery, and/or writing: Denies  Review of Systems  Constitutional:  Positive for fatigue.  HENT:  Positive for mouth dryness and nose dryness. Negative for mouth sores.   Eyes:  Positive for dryness. Negative for pain.  Respiratory:  Negative for shortness of breath and difficulty breathing.   Cardiovascular:  Negative for chest pain and palpitations.  Gastrointestinal:  Negative for blood in stool, constipation and diarrhea.  Endocrine: Negative for increased urination.  Genitourinary:  Negative for involuntary urination.  Musculoskeletal:  Positive for joint pain, joint pain and morning stiffness. Negative for gait problem, joint swelling, myalgias, muscle weakness, muscle tenderness and myalgias.  Skin:  Negative for color change, rash, hair loss and sensitivity to sunlight.  Allergic/Immunologic: Negative for susceptible to infections.  Neurological:  Positive for headaches. Negative for dizziness.  Hematological:  Negative for swollen glands.   Psychiatric/Behavioral:  Positive for sleep disturbance. Negative for depressed mood. The patient is not nervous/anxious.     PMFS History:  Patient Active Problem List   Diagnosis Date Noted   Family history of colon cancer 08/24/2023   Pain from implanted hardware    Hallux rigidus, left foot    Claw toe, acquired, left    Mass of left foot    Family hx of colon cancer 03/29/2018   Elevated LFTs 08/06/2017   Primary osteoarthritis of right hip 08/06/2017   Primary osteoarthritis of both feet 08/06/2017   Primary osteoarthritis of both hands 08/06/2017   Myofascial pain 08/06/2017   Hypothyroid 11/25/2014   Hypothyroidism 11/25/2014    Past Medical History:  Diagnosis Date   Anxiety    Arthritis    Asthma   40 yrs ago   not current probem no inhaler use   Autoimmune hepatitis (HCC)    Basal cell carcinoma 06/2023   nose   Headache(784.0)    Hypothyroidism    Pneumonia     Family History  Problem Relation Age of Onset   CVA Mother    Colon cancer Father    Cancer Sister        breast, thyroid    Heart disease Sister    Pneumonia Brother    Healthy Son    Pancreatic cancer Niece    Liver cancer Niece    Past Surgical History:  Procedure Laterality Date   ABDOMINAL HYSTERECTOMY     20 years ago, complete   ARTHRODESIS METATARSALPHALANGEAL JOINT (MTPJ) Left 01/18/2021   Procedure: LEFT FOOT FIRST METATARSAL  PHALANGEAL ARTHRODESIS AND EXCISION OF LEFT ANKLE LIPOMA;  Surgeon: Nadara Mustard, MD;  Location: [facility];  Service: Orthopedics;  Laterality: Left;   BASAL CELL CARCINOMA EXCISION  06/2023   nose   CARDIAC CATHETERIZATION     CHOLECYSTECTOMY N/A 11/03/2014   Procedure: LAPAROSCOPIC CHOLECYSTECTOMY WITH INTRAOPERATIVE CHOLANGIOGRAM;  Surgeon: Atilano Ina, MD;  Location: [facility];  Service: General;  Laterality: N/A;   COLONOSCOPY N/A 03/27/2013   Procedure: COLONOSCOPY;  Surgeon: Malissa Hippo, MD;  [facility];  Service:  Endoscopy;  Laterality: N/A;  1030   COLONOSCOPY N/A 06/05/2018   Procedure: COLONOSCOPY;  Surgeon: Malissa Hippo, MD;  [facility]: [facility];  Service: Endoscopy;  Laterality: N/A;  1025   COLONOSCOPY WITH PROPOFOL N/A 08/24/2023   Procedure: COLONOSCOPY WITH PROPOFOL;  Surgeon: Franky Macho, MD;  [facility];  Service: Endoscopy;  Laterality: N/A;  9:30AM;ASA 1-2   CYST EXCISION Left 06/07/2023   Procedure: LEFT INDEX FINGER EXCISION MUCOID CYST AND DEBRIDEMENT DISTAL INTERPHALANGEAL JOINT;  Surgeon: [name], MD;  Location: [facility];  Service: Orthopedics;  Laterality: Left;   EYE SURGERY     EYE SURGERY Bilateral 05/17/2022   muscles in eyelids   FOOT ARTHRODESIS Left 07/29/2021   Procedure: REMOVAL HARDWARE AND REVISION FUSION LEFT GREAT TOE METATARSOPHALANGEAL JOINT;  Surgeon: Nadara Mustard, MD;  Location: [facility];  Service: Orthopedics;  Laterality: Left;   FOOT ARTHRODESIS Left 09/27/2022   KNEE ARTHROSCOPY W/ MENISCAL REPAIR Right    TUBAL LIGATION     WEIL OSTEOTOMY Left 01/18/2021   Procedure: WEIL OSTEOTOMY LEFT 2ND AND 3RD METATARSAL;  Surgeon: Nadara Mustard, MD;  Location: [facility];  Service: Orthopedics;  Laterality: Left;   Social History   Social History Narrative   Not on file   Immunization History  Administered Date(s) Administered   Influenza,inj,Quad PF,6+ Mos 07/07/2018   PFIZER(Purple Top)SARS-COV-2 Vaccination 10/23/2019, 11/20/2019, 05/06/2020, 03/23/2021     Objective: Vital Signs: BP 119/71 (BP Location: Left Arm, Patient Position: Sitting, Cuff Size: Normal)   Pulse 69   Resp 15   Ht 5\' 8"  (1.727 m)   Wt 147 lb 9.6 oz (67 kg)   BMI 22.44 kg/m    Physical Exam Vitals and nursing note reviewed.  Constitutional:      Appearance: She is well-developed.  HENT:     Head: Normocephalic and atraumatic.  Eyes:     Conjunctiva/sclera: Conjunctivae normal.  Cardiovascular:     Rate and Rhythm:  Normal rate and regular rhythm.     Heart sounds: Normal heart sounds.  Pulmonary:     Effort: Pulmonary effort is normal.     Breath sounds: Normal breath sounds.  Abdominal:     General: Bowel sounds are normal.     Palpations: Abdomen is soft.  Musculoskeletal:     Cervical back: Normal range of motion.  Lymphadenopathy:     Cervical: No cervical adenopathy.  Skin:    General: Skin is warm and dry.     Capillary Refill: Capillary refill takes less than 2 seconds.  Neurological:     Mental Status: She is alert and oriented to person, place, and time.  Psychiatric:        Behavior: Behavior normal.      Musculoskeletal Exam: Patient had limited range of motion with the lateral rotation of the cervical spine.  Thoracic kyphosis was noted without any tenderness.  Shoulders, elbows, wrist joints, MCPs PIPs and DIPs with good range of motion.  She had bilateral PIP and DIP thickening.  She complains of discomfort over the right first MCP joint with no synovitis.  Hip joints and knee joints in good range of motion.  She had warmth and swelling in the right knee joint.  She had tenderness over bilateral trochanteric bursa.  There was no tenderness over ankles or MTPs.  CDAI Exam: CDAI Score: -- Patient Global: --; Provider Global: -- Swollen: --; Tender: -- Joint Exam 11/14/2023   No joint exam has been documented for this visit   There is currently no information documented on the homunculus. Go to the Rheumatology activity and complete the homunculus joint exam.  Investigation: No additional findings.  Imaging: No results found.  Recent Labs: Lab Results  Component Value Date   WBC 10.2 06/13/2023   HGB 11.6 (L) 06/13/2023   PLT 216 06/13/2023   NA 136 06/13/2023   K 4.2 06/13/2023   CL 99 06/13/2023   CO2 28 06/13/2023   GLUCOSE 99 06/13/2023   BUN 22 06/13/2023   CREATININE 0.85 06/13/2023   BILITOT 0.4 06/13/2023   ALKPHOS 113 09/04/2022   AST 20 06/13/2023    ALT 13 06/13/2023   PROT 6.5 06/13/2023   ALBUMIN 4.1 09/04/2022   CALCIUM 9.6 06/13/2023   GFRAA 71 06/26/2017   QFTBGOLD NEGATIVE 06/26/2017    Speciality Comments: No specialty comments available.  Procedures:  No procedures performed Allergies: Tylenol [acetaminophen]   Assessment / Plan:     Visit Diagnoses: Autoimmune hepatitis (HCC) -patient is on Imuran 50 mg a day and prednisone 5 mg a day for many years now.  Her LFTs have been stable.  She is followed by Dr. Elnoria Howard.  She will discuss possibly tapering dose of prednisone with Dr. Janee Morn.  On prednisone therapy - Prednisone 5 mg p.o. daily.  Side effects of long-term use of prednisone including weight gain, hypertension, diabetes, cataracts, osteoporosis were discussed.  High risk medication use - Imuran 50 mg 1 tablet by mouth daily and prednisone 5 mg 1 tablet by mouth daily-prescribed by Dr. Elnoria Howard.  She has been getting labs through Dr. Elnoria Howard every 6 months now.  Myofascial pain-she continues to have some generalized pain and discomfort.  Primary osteoarthritis of both hands -she complains of discomfort over right first MCP joint.  No synovitis was noted.  Joint protection was discussed.  Hand muscle strength exercises were discussed.  Patient underwent left index finger mucinous cyst removal and debridement on 06/07/2023 performed by Dr. Merlyn Lot.  Primary osteoarthritis of right hip-good range of motion without discomfort.  Trochanteric bursitis of both hips-she is intermittent discomfort.  Stretches were advised.  Primary osteoarthritis of both knees-she complains of discomfort in her knee joints.  She had to warmth and swelling in the right knee joint.  She states she gets viscosupplement injections by orthopedics.  Primary osteoarthritis of both feet - She underwent left foot surgery by Dr. Remer Macho at [facility].  Patient states her foot pain is more manageable since she has been wearing [employer] shoes.  Claw toe, acquired, left - L 1st  MTP fusion,osteotomy of left 2nd & 3rd metatarasal-Dr. Lajoyce Corners 01/18/2021. Required surgical intervention x3.  Osteopenia of multiple sites - Reclast IV in 2021 by Dr. Talmage Nap. June 12, 2022 the BMD measured at Femur Neck Right is 0.746 g/cm2 with a T-score of -2.1.  She is currently on a drug holiday.  She will have  repeat DEXA scan in October 2025.  Calcium rich diet and vitamin D was advised.  Regular exercise was emphasized.  Vitamin D deficiency  Other kyphosis of thoracic region-she has significant thoracic kyphosis.  Stretching exercises were discussed today.  History of hypothyroidism  Orders: No orders of the defined types were placed in this encounter.  No orders of the defined types were placed in this encounter.    Follow-Up Instructions: Return in about 6 months (around 05/16/2024) for Autoimmune hepatitis, osteoarthritis, osteopenia.   Pollyann Savoy, MD  Note - This record has been created using Animal nutritionist.  Chart creation errors have been sought, but may not always  have been located. Such creation errors do not reflect on  the standard of medical care.

## 2023-11-14 ENCOUNTER — Ambulatory Visit: Payer: Medicare Other | Attending: Rheumatology | Admitting: Rheumatology

## 2023-11-14 ENCOUNTER — Encounter: Payer: Self-pay | Admitting: Rheumatology

## 2023-11-14 VITALS — BP 119/71 | HR 69 | Resp 15 | Ht 68.0 in | Wt 147.6 lb

## 2023-11-14 DIAGNOSIS — M7062 Trochanteric bursitis, left hip: Secondary | ICD-10-CM | POA: Diagnosis present

## 2023-11-14 DIAGNOSIS — Z8639 Personal history of other endocrine, nutritional and metabolic disease: Secondary | ICD-10-CM | POA: Insufficient documentation

## 2023-11-14 DIAGNOSIS — E559 Vitamin D deficiency, unspecified: Secondary | ICD-10-CM | POA: Insufficient documentation

## 2023-11-14 DIAGNOSIS — M8589 Other specified disorders of bone density and structure, multiple sites: Secondary | ICD-10-CM | POA: Insufficient documentation

## 2023-11-14 DIAGNOSIS — M19071 Primary osteoarthritis, right ankle and foot: Secondary | ICD-10-CM | POA: Diagnosis present

## 2023-11-14 DIAGNOSIS — Z7952 Long term (current) use of systemic steroids: Secondary | ICD-10-CM | POA: Diagnosis present

## 2023-11-14 DIAGNOSIS — M170 Bilateral primary osteoarthritis of knee: Secondary | ICD-10-CM | POA: Insufficient documentation

## 2023-11-14 DIAGNOSIS — M19041 Primary osteoarthritis, right hand: Secondary | ICD-10-CM | POA: Diagnosis present

## 2023-11-14 DIAGNOSIS — Z79899 Other long term (current) drug therapy: Secondary | ICD-10-CM | POA: Diagnosis not present

## 2023-11-14 DIAGNOSIS — M7061 Trochanteric bursitis, right hip: Secondary | ICD-10-CM | POA: Insufficient documentation

## 2023-11-14 DIAGNOSIS — K754 Autoimmune hepatitis: Secondary | ICD-10-CM | POA: Diagnosis not present

## 2023-11-14 DIAGNOSIS — M7918 Myalgia, other site: Secondary | ICD-10-CM | POA: Insufficient documentation

## 2023-11-14 DIAGNOSIS — M40294 Other kyphosis, thoracic region: Secondary | ICD-10-CM | POA: Diagnosis present

## 2023-11-14 DIAGNOSIS — M205X2 Other deformities of toe(s) (acquired), left foot: Secondary | ICD-10-CM | POA: Insufficient documentation

## 2023-11-14 DIAGNOSIS — M19042 Primary osteoarthritis, left hand: Secondary | ICD-10-CM | POA: Diagnosis present

## 2023-11-14 DIAGNOSIS — M1611 Unilateral primary osteoarthritis, right hip: Secondary | ICD-10-CM | POA: Diagnosis present

## 2023-11-14 DIAGNOSIS — M19072 Primary osteoarthritis, left ankle and foot: Secondary | ICD-10-CM | POA: Insufficient documentation

## 2023-11-15 ENCOUNTER — Ambulatory Visit: Payer: Medicare Other | Admitting: Rheumatology

## 2024-03-10 ENCOUNTER — Other Ambulatory Visit (HOSPITAL_COMMUNITY): Payer: Self-pay | Admitting: Gastroenterology

## 2024-03-10 DIAGNOSIS — K754 Autoimmune hepatitis: Secondary | ICD-10-CM

## 2024-03-27 NOTE — Progress Notes (Signed)
 Chief Complaint: Autoimmune hepatitis - Image guided random liver biopsy   Referring Provider(s): Rollin Dover  Supervising Physician: Johann Sieving  Patient Status: [name] - Out-pt  History of Present Illness: Kimberly Reyes is a 68 y.o. female with history of arthritis, asthma, hypothyroidism, basal cell carcinoma, and autoimmune hepatitis.  Pt is followed by Dr. Rollin of rheumatology for autoimmune hepatitis. Dr. Rollin recommended liver biopsy for further diagnostic workup to aid in treatment plan.  Pt was referred to interventional radiology for image guided random liver biopsy.    *** Patient is Full Code  Past Medical History:  Diagnosis Date   Anxiety    Arthritis    Asthma   40 yrs ago   not current probem no inhaler use   Autoimmune hepatitis (HCC)    Basal cell carcinoma 06/2023   nose   Headache(784.0)    Hypothyroidism    Pneumonia     Past Surgical History:  Procedure Laterality Date   ABDOMINAL HYSTERECTOMY     20 years ago, complete   ARTHRODESIS METATARSALPHALANGEAL JOINT (MTPJ) Left 01/18/2021   Procedure: LEFT FOOT FIRST METATARSAL PHALANGEAL ARTHRODESIS AND EXCISION OF LEFT ANKLE LIPOMA;  Surgeon: Harden Jerona GAILS, MD;  Location: [facility];  Service: Orthopedics;  Laterality: Left;   BASAL CELL CARCINOMA EXCISION  06/2023   nose   CARDIAC CATHETERIZATION     CHOLECYSTECTOMY N/A 11/03/2014   Procedure: LAPAROSCOPIC CHOLECYSTECTOMY WITH INTRAOPERATIVE CHOLANGIOGRAM;  Surgeon: Camellia CHRISTELLA Blush, MD;  Location: [facility];  Service: General;  Laterality: N/A;   COLONOSCOPY N/A 03/27/2013   Procedure: COLONOSCOPY;  Surgeon: Claudis RAYMOND Rivet, MD;  [facility];  Service: Endoscopy;  Laterality: N/A;  1030   COLONOSCOPY N/A 06/05/2018   Procedure: COLONOSCOPY;  Surgeon: Rivet Claudis RAYMOND, MD;  [facility];  Service: Endoscopy;  Laterality: N/A;  1025   COLONOSCOPY WITH PROPOFOL  N/A 08/24/2023   Procedure: COLONOSCOPY WITH  PROPOFOL ;  Surgeon: Cinderella Deatrice FALCON, MD;  [facility];  Service: Endoscopy;  Laterality: N/A;  9:30AM;ASA 1-2   CYST EXCISION Left 06/07/2023   Procedure: LEFT INDEX FINGER EXCISION MUCOID CYST AND DEBRIDEMENT DISTAL INTERPHALANGEAL JOINT;  Surgeon: Murrell Drivers, MD;  Location: [facility];  Service: Orthopedics;  Laterality: Left;   EYE SURGERY     EYE SURGERY Bilateral 05/17/2022   muscles in eyelids   FOOT ARTHRODESIS Left 07/29/2021   Procedure: REMOVAL HARDWARE AND REVISION FUSION LEFT GREAT TOE METATARSOPHALANGEAL JOINT;  Surgeon: Harden Jerona GAILS, MD;  Location: [facility] OR;  Service: Orthopedics;  Laterality: Left;   FOOT ARTHRODESIS Left 09/27/2022   KNEE ARTHROSCOPY W/ MENISCAL REPAIR Right    TUBAL LIGATION     WEIL OSTEOTOMY Left 01/18/2021   Procedure: WEIL OSTEOTOMY LEFT 2ND AND 3RD METATARSAL;  Surgeon: Harden Jerona GAILS, MD;  Location: [facility];  Service: Orthopedics;  Laterality: Left;    Allergies: Tylenol  [acetaminophen ]  Medications: Prior to Admission medications   Medication Sig Start Date End Date Taking? Authorizing Provider  Ascorbic Acid (VITAMIN C WITH ROSE HIPS) 1000 MG tablet Take 1,000 mg by mouth daily.    [provider]  azaTHIOprine (IMURAN) 50 MG tablet Take 50 mg by mouth at bedtime.     [provider]  Calcium-Magnesium-Vitamin D (CALCIUM 1200+D3 PO) Take 1 tablet by mouth at bedtime.    [provider]  Cyanocobalamin (VITAMIN B-12) 5000 MCG SUBL Place 5,000 mcg under the  tongue at bedtime.     [provider]  Escitalopram Oxalate (LEXAPRO PO) Take by mouth.    [provider]  folic acid  (FOLVITE ) 1 MG tablet Take 1 mg by mouth daily.    [provider]  Glucosamine HCl (GLUCOSAMINE PO) Take 1 tablet by mouth daily.    [provider]  L-Theanine 100 MG CAPS Take 100 mg by mouth daily.    [provider]  levothyroxine (SYNTHROID) 112 MCG  tablet Take 112 mcg by mouth every morning. 11/25/22   [provider]  MAGNESIUM CITRATE PO Take 100 mg by mouth daily.    [provider]  minoxidil (LONITEN) 2.5 MG tablet Take 2.5 mg by mouth daily.    [provider]  Multiple Vitamin (MULTIVITAMIN) tablet Take 1 tablet by mouth every evening.     [provider]  Multiple Vitamins-Minerals (HAIR/SKIN/NAILS/BIOTIN PO) Take 3 each by mouth daily.    [provider]  OVER THE COUNTER MEDICATION Take 2 tablets by mouth daily. Focus factor    [provider]  predniSONE (DELTASONE) 5 MG tablet Take 5 mg by mouth daily after breakfast.  07/27/17   [provider]  Probiotic Product (PROBIOTIC DAILY PO) Take 1 capsule by mouth daily.    [provider]  Sod Picosulfate-Mag Ox-Cit Acd (CLENPIQ ) 10-3.5-12 MG-GM -GM/175ML SOLN Take 1 kit by mouth as directed. Patient not taking: Reported on 11/14/2023 07/23/23   Ahmed, Muhammad F, MD  Turmeric Curcumin 500 MG CAPS Take 500 mg by mouth every evening.    [provider]  zolpidem (AMBIEN) 10 MG tablet Take 10 mg by mouth at bedtime. 05/12/17   [provider]     Family History  Problem Relation Age of Onset   CVA Mother    Colon cancer Father    Cancer Sister        breast, thyroid     Heart disease Sister    Pneumonia Brother    Healthy Son    Pancreatic cancer Niece    Liver cancer Niece     Social History   Socioeconomic History   Marital status: Married    Spouse name: Not on file   Number of children: Not on file   Years of education: Not on file   Highest education level: Not on file  Occupational History   Not on file  Tobacco Use   Smoking status: Never    Passive exposure: Never   Smokeless tobacco: Never  Vaping Use   Vaping status: Never Used  Substance and Sexual Activity   Alcohol use: Not Currently   Drug use: Never   Sexual activity: Not on file    Comment: hyst  Other Topics  Concern   Not on file  Social History Narrative   Not on file   Social Drivers of Health   Financial Resource Strain: Not on file  Food Insecurity: Not on file  Transportation Needs: Not on file  Physical Activity: Not on file  Stress: Not on file  Social Connections: Not on file     Review of Systems: A 12 point ROS discussed and pertinent positives are indicated in the HPI above.  All other systems are negative.  Review of Systems  Vital Signs: There were no vitals taken for this visit.  Advance Care Plan: The advanced care place/surrogate decision maker was discussed at the time of visit and the patient did not wish to discuss or was not  able to name a surrogate decision maker or provide an advance care plan.  Physical Exam  Imaging: No results found.  Labs:  CBC: Recent Labs    06/13/23 1339  WBC 10.2  HGB 11.6*  HCT 36.7  PLT 216    COAGS: No results for input(s): INR, APTT in the last 8760 hours.  BMP: Recent Labs    06/13/23 1339  NA 136  K 4.2  CL 99  CO2 28  GLUCOSE 99  BUN 22  CALCIUM 9.6  CREATININE 0.85    LIVER FUNCTION TESTS: Recent Labs    06/13/23 1339  BILITOT 0.4  AST 20  ALT 13  PROT 6.5    TUMOR MARKERS: No results for input(s): AFPTM, CEA, CA199, CHROMGRNA in the last 8760 hours.  Assessment and Plan:  Pt is with Autoimmune hepatitis scheduled for Image guided random liver biopsy 04/02/24.    Risks and benefits of random liver biopsy was discussed with the patient and/or patient's family including, but not limited to bleeding, infection, damage to adjacent structures or low yield requiring additional tests.  All of the questions were answered and there is agreement to proceed.  Consent signed and in chart.  Thank you for allowing our service to participate in ISLAY POLANCO 's care.  Electronically Signed: Lavanda JAYSON Jurist, PA-C   03/27/2024, 10:20 AM    I spent a total of  30 Minutes   in face  to face in clinical consultation, greater than 50% of which was counseling/coordinating care for random liver biopsy.

## 2024-04-01 ENCOUNTER — Other Ambulatory Visit: Payer: Self-pay | Admitting: Student

## 2024-04-01 DIAGNOSIS — Z01818 Encounter for other preprocedural examination: Secondary | ICD-10-CM

## 2024-04-02 ENCOUNTER — Encounter (HOSPITAL_COMMUNITY): Payer: Self-pay

## 2024-04-02 ENCOUNTER — Ambulatory Visit (HOSPITAL_COMMUNITY)
Admission: RE | Admit: 2024-04-02 | Discharge: 2024-04-02 | Disposition: A | Discharge status: Home / Self Care | Source: Ambulatory Visit | Attending: Gastroenterology | Admitting: Gastroenterology

## 2024-04-02 ENCOUNTER — Other Ambulatory Visit: Payer: Self-pay

## 2024-04-02 DIAGNOSIS — E039 Hypothyroidism, unspecified: Secondary | ICD-10-CM | POA: Diagnosis not present

## 2024-04-02 DIAGNOSIS — M1990 Unspecified osteoarthritis, unspecified site: Secondary | ICD-10-CM | POA: Diagnosis not present

## 2024-04-02 DIAGNOSIS — K754 Autoimmune hepatitis: Secondary | ICD-10-CM | POA: Diagnosis not present

## 2024-04-02 DIAGNOSIS — J45909 Unspecified asthma, uncomplicated: Secondary | ICD-10-CM | POA: Insufficient documentation

## 2024-04-02 DIAGNOSIS — Z01818 Encounter for other preprocedural examination: Secondary | ICD-10-CM | POA: Insufficient documentation

## 2024-04-02 LAB — PROTIME-INR
INR: 0.9 (ref 0.8–1.2)
Prothrombin Time: 12.6 s (ref 11.4–15.2)

## 2024-04-02 LAB — CBC
HCT: 36.7 % (ref 36.0–46.0)
Hemoglobin: 12.1 g/dL (ref 12.0–15.0)
MCH: 31.8 pg (ref 26.0–34.0)
MCHC: 33 g/dL (ref 30.0–36.0)
MCV: 96.6 fL (ref 80.0–100.0)
Platelets: 204 K/uL (ref 150–400)
RBC: 3.8 MIL/uL — ABNORMAL LOW (ref 3.87–5.11)
RDW: 12.3 % (ref 11.5–15.5)
WBC: 8.2 K/uL (ref 4.0–10.5)
nRBC: 0 % (ref 0.0–0.2)

## 2024-04-02 MED ORDER — FENTANYL CITRATE (PF) 100 MCG/2ML IJ SOLN
INTRAMUSCULAR | Status: AC
  Filled 2024-04-02: qty 2

## 2024-04-02 MED ORDER — LIDOCAINE HCL (PF) 1 % IJ SOLN
10.0000 mL | Freq: Once | INTRAMUSCULAR | Status: AC
  Administered 2024-04-02: 10 mL via INTRADERMAL

## 2024-04-02 MED ORDER — FENTANYL CITRATE (PF) 100 MCG/2ML IJ SOLN
INTRAMUSCULAR | Status: AC | PRN
  Administered 2024-04-02 (×2): 50 ug via INTRAVENOUS

## 2024-04-02 MED ORDER — SODIUM CHLORIDE 0.9 % IV SOLN: INTRAVENOUS | Status: DC

## 2024-04-02 MED ORDER — MIDAZOLAM HCL 2 MG/2ML IJ SOLN
INTRAMUSCULAR | Status: AC | PRN
Start: 2024-04-02 — End: 2024-04-02
  Administered 2024-04-02 (×2): 1 mg via INTRAVENOUS

## 2024-04-02 MED ORDER — MIDAZOLAM HCL 2 MG/2ML IJ SOLN
INTRAMUSCULAR | Status: AC
  Filled 2024-04-02: qty 2

## 2024-04-02 NOTE — Procedures (Signed)
  Procedure:  US  core liver biopsy   Preprocedure diagnosis: Diagnoses of Hepatitis, autoimmune (HCC) and Pre-op testing were pertinent to this visit. Postprocedure diagnosis: same EBL:    minimal Complications:   none immediate  See full dictation in [employer].  CHARM Toribio Faes MD Main # [phone] Pager  [phone] Mobile [phone]

## 2024-04-07 LAB — SURGICAL PATHOLOGY

## 2024-04-16 ENCOUNTER — Ambulatory Visit: Admitting: Physician Assistant

## 2024-04-30 ENCOUNTER — Other Ambulatory Visit (HOSPITAL_COMMUNITY): Payer: Self-pay | Admitting: Family Medicine

## 2024-04-30 DIAGNOSIS — R590 Localized enlarged lymph nodes: Secondary | ICD-10-CM

## 2024-05-01 ENCOUNTER — Ambulatory Visit (HOSPITAL_COMMUNITY)
Admission: RE | Admit: 2024-05-01 | Discharge: 2024-05-01 | Disposition: A | Discharge status: Home / Self Care | Source: Ambulatory Visit | Attending: Family Medicine | Admitting: Family Medicine

## 2024-05-01 DIAGNOSIS — R590 Localized enlarged lymph nodes: Secondary | ICD-10-CM | POA: Insufficient documentation

## 2024-05-08 ENCOUNTER — Other Ambulatory Visit (HOSPITAL_COMMUNITY): Payer: Self-pay | Admitting: Endocrinology

## 2024-05-08 DIAGNOSIS — R590 Localized enlarged lymph nodes: Secondary | ICD-10-CM

## 2024-05-14 NOTE — Progress Notes (Unsigned)
 Office Visit Note  Patient: Kimberly Reyes             Date of Birth: 1956/05/12           MRN: [id number]             PCP: Marvine Rush, MD Referring: Marvine Rush, MD Visit Date: 05/28/2024 Occupation: @GUAROCC @  Subjective:  Routine follow up   History of Present Illness: Kimberly Reyes is a 68 y.o. female with history of autoimmune hepatitis and osteoarthritis.  Patient remains under the close follow-up of Dr. Rollin.  Patient states that she tapered off prednisone and Imuran about 4 weeks ago and will be having updated lab work to recheck her hepatic function panel.  Patient has not noticed any difference in her symptoms since discontinuing prednisone and Imuran.  She is actually noticing improvement off of prednisone.  Her energy level has been stable. She continues to have chronic pain in the right knee.  She does not want to proceed with surgical intervention.  She is planning to get viscosupplementation for both knees next week. She remains under the care of Dr. Ballin for management of osteopenia.  She is due for an updated bone density in October 2025 and would like a new order placed.  Patient states that there has been discussion of initiating Prolia pending bone density results. Patient states that she has received the annual flu shot and COVID-vaccine.    Activities of Daily Living:  Patient reports morning stiffness for 20 minutes.   Patient Reports nocturnal pain.  Difficulty dressing/grooming: Denies Difficulty climbing stairs: Reports Difficulty getting out of chair: Denies Difficulty using hands for taps, buttons, cutlery, and/or writing: Denies  Review of Systems  Constitutional:  Positive for fatigue.  HENT:  Negative for mouth sores and mouth dryness.   Eyes:  Positive for dryness.  Respiratory:  Negative for shortness of breath.   Cardiovascular:  Negative for chest pain and palpitations.  Gastrointestinal:  Negative for blood in stool, constipation and  diarrhea.  Endocrine: Negative for increased urination.  Genitourinary:  Negative for involuntary urination.  Musculoskeletal:  Positive for joint pain, joint pain, joint swelling and morning stiffness. Negative for gait problem, myalgias, muscle weakness, muscle tenderness and myalgias.  Skin:  Negative for color change, rash, hair loss and sensitivity to sunlight.  Allergic/Immunologic: Negative for susceptible to infections.  Neurological:  Positive for dizziness and headaches.  Hematological:  Positive for swollen glands.  Psychiatric/Behavioral:  Positive for sleep disturbance. Negative for depressed mood. The patient is not nervous/anxious.     PMFS History:  Patient Active Problem List   Diagnosis Date Noted   Family history of colon cancer 08/24/2023   Pain from implanted hardware    Hallux rigidus, left foot    Claw toe, acquired, left    Mass of left foot    Family hx of colon cancer 03/29/2018   Elevated LFTs 08/06/2017   Primary osteoarthritis of right hip 08/06/2017   Primary osteoarthritis of both feet 08/06/2017   Primary osteoarthritis of both hands 08/06/2017   Myofascial pain 08/06/2017   Hypothyroid 11/25/2014   Hypothyroidism 11/25/2014    Past Medical History:  Diagnosis Date   Anxiety    Arthritis    Asthma   40 yrs ago   not current probem no inhaler use   Autoimmune hepatitis (HCC)    Basal cell carcinoma 06/2023   nose   Headache(784.0)    Hypothyroidism    Pneumonia  Family History  Problem Relation Age of Onset   CVA Mother    Colon cancer Father    Cancer Sister        breast, thyroid     Heart disease Sister    Pneumonia Brother    Healthy Son    Pancreatic cancer Niece    Liver cancer Niece    Past Surgical History:  Procedure Laterality Date   ABDOMINAL HYSTERECTOMY     20 years ago, complete   ARTHRODESIS METATARSALPHALANGEAL JOINT (MTPJ) Left 01/18/2021   Procedure: LEFT FOOT FIRST METATARSAL PHALANGEAL ARTHRODESIS AND  EXCISION OF LEFT ANKLE LIPOMA;  Surgeon: Harden Jerona GAILS, MD;  Location: [facility];  Service: Orthopedics;  Laterality: Left;   BASAL CELL CARCINOMA EXCISION  06/2023   nose   CARDIAC CATHETERIZATION     CHOLECYSTECTOMY N/A 11/03/2014   Procedure: LAPAROSCOPIC CHOLECYSTECTOMY WITH INTRAOPERATIVE CHOLANGIOGRAM;  Surgeon: Camellia CHRISTELLA Blush, MD;  Location: [facility];  Service: General;  Laterality: N/A;   COLONOSCOPY N/A 03/27/2013   Procedure: COLONOSCOPY;  Surgeon: Claudis RAYMOND Rivet, MD;  Location: [facility];  Service: Endoscopy;  Laterality: N/A;  1030   COLONOSCOPY N/A 06/05/2018   Procedure: COLONOSCOPY;  Surgeon: Rivet Claudis RAYMOND, MD;  Location: [facility];  Service: Endoscopy;  Laterality: N/A;  1025   COLONOSCOPY WITH PROPOFOL  N/A 08/24/2023   Procedure: COLONOSCOPY WITH PROPOFOL ;  Surgeon: Cinderella Deatrice FALCON, MD;  Location: [facility];  Service: Endoscopy;  Laterality: N/A;  9:30AM;ASA 1-2   CYST EXCISION Left 06/07/2023   Procedure: LEFT INDEX FINGER EXCISION MUCOID CYST AND DEBRIDEMENT DISTAL INTERPHALANGEAL JOINT;  Surgeon: Murrell Drivers, MD;  Location: [facility];  Service: Orthopedics;  Laterality: Left;   EYE SURGERY     EYE SURGERY Bilateral 05/17/2022   muscles in eyelids   FOOT ARTHRODESIS Left 07/29/2021   Procedure: REMOVAL HARDWARE AND REVISION FUSION LEFT GREAT TOE METATARSOPHALANGEAL JOINT;  Surgeon: Harden Jerona GAILS, MD;  Location: [facility];  Service: Orthopedics;  Laterality: Left;   FOOT ARTHRODESIS Left 09/27/2022   KNEE ARTHROSCOPY W/ MENISCAL REPAIR Right    TUBAL LIGATION     WEIL OSTEOTOMY Left 01/18/2021   Procedure: WEIL OSTEOTOMY LEFT 2ND AND 3RD METATARSAL;  Surgeon: Harden Jerona GAILS, MD;  Location:  [facility];  Service: Orthopedics;  Laterality: Left;   Social History   Social History Narrative   Not on file   Immunization History  Administered Date(s) Administered   Influenza,inj,Quad PF,6+ Mos 07/07/2018    PFIZER(Purple Top)SARS-COV-2 Vaccination 10/23/2019, 11/20/2019, 05/06/2020, 03/23/2021     Objective: Vital Signs: BP 106/67   Pulse 69   Temp (!) 96.8 F (36 C)   Resp 14   Ht 5' 8 (1.727 m)   Wt 148 lb 6.4 oz (67.3 kg)   BMI 22.56 kg/m    Physical Exam Vitals and nursing note reviewed.  Constitutional:      Appearance: She is well-developed.  HENT:     Head: Normocephalic and atraumatic.  Eyes:     Conjunctiva/sclera: Conjunctivae normal.  Cardiovascular:     Rate and Rhythm: Normal rate and regular rhythm.     Heart sounds: Normal heart sounds.  Pulmonary:     Effort: Pulmonary effort is normal.     Breath sounds: Normal breath sounds.  Abdominal:     General: Bowel sounds are normal.     Palpations: Abdomen is soft.  Musculoskeletal:     Cervical back: Normal range of  motion.  Lymphadenopathy:     Cervical: No cervical adenopathy.  Skin:    General: Skin is warm and dry.     Capillary Refill: Capillary refill takes less than 2 seconds.  Neurological:     Mental Status: She is alert and oriented to person, place, and time.  Psychiatric:        Behavior: Behavior normal.      Musculoskeletal Exam: C-spine limited range of motion without rotation.  Thoracic kyphosis noted.  No midline spinal tenderness.  No SI joint tenderness.  Shoulder joints, elbow joints, wrist joints, MCPs, PIPs, DIPs have good range of motion with no synovitis.  PIP and DIP thickening consistent with osteoarthritis of both hands. Complete fist formation bilaterally.  Hip joints have good range of motion with no groin pain.  Painful range of motion of both knees with swelling of the right knee.  Ankle joints have good range of motion no tenderness or joint swelling.  No evidence of Achilles tendinitis or plantar fasciitis.   CDAI Exam: CDAI Score: -- Patient Global: --; Provider Global: -- Swollen: --; Tender: -- Joint Exam 05/28/2024   No joint exam has been documented for this visit    There is currently no information documented on the homunculus. Go to the Rheumatology activity and complete the homunculus joint exam.  Investigation: No additional findings.  Imaging: US  SOFT TISSUE HEAD & NECK (NON-THYROID ) Result Date: 05/01/2024 CLINICAL DATA:  Left cervical lymphadenopathy EXAM: ULTRASOUND OF HEAD/NECK SOFT TISSUES TECHNIQUE: Ultrasound examination of the head and neck soft tissues was performed in the area of clinical concern. COMPARISON:  None Available. FINDINGS: Scanning throughout the region of concern does not show any sonographically abnormal lymph nodes. Multiple cervical chain nodes are present but none exceed the range of normal in size utilizing short axis dimension. The morphology appears subjectively normal without evidence of rounded morphology or hyperemia. No sign suppuration. During the scan, multiple thyroid  nodules are seen on the left, the largest measuring 2.4 x 1.4 x 1.5 cm. Compared to a thyroid  ultrasound done in April of 2017, this nodule appears very similar. Biopsy was performed subsequent to the thyroid  ultrasound in 2017. IMPRESSION: 1. No sonographically abnormal lymph nodes are identified in the region of concern. 2. Multiple thyroid  nodules are seen on the left, the largest measuring 2.4 x 1.4 x 1.5 cm. Compared to a thyroid  ultrasound done in April of 2017, this nodule appears very similar. Biopsy was performed subsequent to the thyroid  ultrasound in 2017. Electronically Signed   By: Oneil Officer M.D.   On: 05/01/2024 12:23    Recent Labs: Lab Results  Component Value Date   WBC 8.2 04/02/2024   HGB 12.1 04/02/2024   PLT 204 04/02/2024   NA 136 06/13/2023   K 4.2 06/13/2023   CL 99 06/13/2023   CO2 28 06/13/2023   GLUCOSE 99 06/13/2023   BUN 22 06/13/2023   CREATININE 0.85 06/13/2023   BILITOT 0.4 06/13/2023   ALKPHOS 113 09/04/2022   AST 20 06/13/2023   ALT 13 06/13/2023   PROT 6.5 06/13/2023   ALBUMIN 4.1 09/04/2022   CALCIUM  9.6 06/13/2023   GFRAA 71 06/26/2017   QFTBGOLD NEGATIVE 06/26/2017    Speciality Comments: No specialty comments available.  Procedures:  No procedures performed Allergies: Tylenol  [acetaminophen ]    Assessment / Plan:     Visit Diagnoses: Autoimmune hepatitis (HCC) - LFTs have been stable.  Followed by Dr. Rollin: Patient started tapering off of both  prednisone and Imuran 6 weeks ago and has been completely off both medications for the past 4 weeks.  She will be having updated lab work to recheck her hepatic function panel off of therapy soon. She has not noticed any new or worsening symptoms since discontinuing Imuran and prednisone.  Her energy level has been stable.  She will continue to follow up in 6 months or sooner if needed.   On prednisone therapy -Discontinued prednisone in August 2025.   High risk medication use -Not currently taking immunosuppressive agents.  She discontinued both Imuran and prednisone 4 weeks ago.  Offered to update lab work today but she will be having updated lab work with Dr. Rollin soon.  Myofascial pain: Manageable.    Primary osteoarthritis of both hands - Underwent left index finger mucinous cyst removal and debridement on 06/07/2023 performed by Dr. Murrell. No synovitis noted on examination today.  PIP and DIP thickening consistent with osteoarthritis of both hands noted. Discussed the importance of joint protection and muscle strengthening.  Primary osteoarthritis of right hip: No groin pain currently.  Trochanteric bursitis of both hips: Not currently symptomatic.  Discussed the importance of performing ROM exercises.   Primary osteoarthritis of both knees: Warmth and swelling of the right knee was noted on examination today.  She has discomfort range of motion of both knees.  Patient is planning to proceed with viscosupplementation for both knees next week.  She does not plan to proceed with knee replacements in the future.  Primary  osteoarthritis of both feet - Underwent left foot surgery by Dr. Roy at [facility].    Claw toe, acquired, left - L 1st MTP fusion,osteotomy of left 2nd & 3rd metatarasal-Dr. Harden 01/18/2021. Required surgical intervention x3.  Osteopenia of multiple sites - Reclast  IV in 2021 by Dr. Tommas. June 12, 2022 the BMD RFN 0.746 g/cm2 with a T-score of -2.1.currently on a drug holiday. She remains under the care of Dr. Tommas. According the patient there has been discussion of starting Prolia pending DEXA results. Plan to repeat DEXA scan in October 2025--Order for DEXA placed today.  We will call to discuss DEXA results and can forward results to Dr. Balan as well.- Plan: DG BONE DENSITY (DXA)  Vitamin D deficiency: She is taking a calcium and vitamin D supplement daily.  Other kyphosis of thoracic region  History of hypothyroidism  Orders: Orders Placed This Encounter  Procedures   DG BONE DENSITY (DXA)   No orders of the defined types were placed in this encounter.    Follow-Up Instructions: Return in about 6 months (around 11/25/2024).   Waddell CHRISTELLA Craze, PA-C  Note - This record has been created using Dragon software.  Chart creation errors have been sought, but may not always  have been located. Such creation errors do not reflect on  the standard of medical care.

## 2024-05-21 ENCOUNTER — Ambulatory Visit: Admitting: Physician Assistant

## 2024-05-28 ENCOUNTER — Encounter: Payer: Self-pay | Admitting: Physician Assistant

## 2024-05-28 ENCOUNTER — Ambulatory Visit: Attending: Physician Assistant | Admitting: Physician Assistant

## 2024-05-28 VITALS — BP 106/67 | HR 69 | Temp 96.8°F | Resp 14 | Ht 68.0 in | Wt 148.4 lb

## 2024-05-28 DIAGNOSIS — M7062 Trochanteric bursitis, left hip: Secondary | ICD-10-CM | POA: Insufficient documentation

## 2024-05-28 DIAGNOSIS — M205X2 Other deformities of toe(s) (acquired), left foot: Secondary | ICD-10-CM | POA: Insufficient documentation

## 2024-05-28 DIAGNOSIS — K754 Autoimmune hepatitis: Secondary | ICD-10-CM | POA: Insufficient documentation

## 2024-05-28 DIAGNOSIS — M19071 Primary osteoarthritis, right ankle and foot: Secondary | ICD-10-CM | POA: Insufficient documentation

## 2024-05-28 DIAGNOSIS — Z7952 Long term (current) use of systemic steroids: Secondary | ICD-10-CM | POA: Diagnosis present

## 2024-05-28 DIAGNOSIS — M19072 Primary osteoarthritis, left ankle and foot: Secondary | ICD-10-CM | POA: Insufficient documentation

## 2024-05-28 DIAGNOSIS — E559 Vitamin D deficiency, unspecified: Secondary | ICD-10-CM | POA: Diagnosis present

## 2024-05-28 DIAGNOSIS — M7918 Myalgia, other site: Secondary | ICD-10-CM | POA: Diagnosis present

## 2024-05-28 DIAGNOSIS — M40294 Other kyphosis, thoracic region: Secondary | ICD-10-CM | POA: Insufficient documentation

## 2024-05-28 DIAGNOSIS — Z8639 Personal history of other endocrine, nutritional and metabolic disease: Secondary | ICD-10-CM | POA: Insufficient documentation

## 2024-05-28 DIAGNOSIS — M7061 Trochanteric bursitis, right hip: Secondary | ICD-10-CM | POA: Diagnosis present

## 2024-05-28 DIAGNOSIS — M8589 Other specified disorders of bone density and structure, multiple sites: Secondary | ICD-10-CM | POA: Insufficient documentation

## 2024-05-28 DIAGNOSIS — M19041 Primary osteoarthritis, right hand: Secondary | ICD-10-CM | POA: Insufficient documentation

## 2024-05-28 DIAGNOSIS — M170 Bilateral primary osteoarthritis of knee: Secondary | ICD-10-CM | POA: Insufficient documentation

## 2024-05-28 DIAGNOSIS — M1611 Unilateral primary osteoarthritis, right hip: Secondary | ICD-10-CM | POA: Insufficient documentation

## 2024-05-28 DIAGNOSIS — Z79899 Other long term (current) drug therapy: Secondary | ICD-10-CM | POA: Diagnosis present

## 2024-05-28 DIAGNOSIS — M19042 Primary osteoarthritis, left hand: Secondary | ICD-10-CM | POA: Diagnosis present

## 2024-05-29 ENCOUNTER — Ambulatory Visit: Admitting: Physician Assistant

## 2024-05-29 ENCOUNTER — Ambulatory Visit (HOSPITAL_COMMUNITY)
Admission: RE | Admit: 2024-05-29 | Discharge: 2024-05-29 | Disposition: A | Discharge status: Home / Self Care | Source: Ambulatory Visit | Attending: Endocrinology | Admitting: Endocrinology

## 2024-05-29 DIAGNOSIS — R590 Localized enlarged lymph nodes: Secondary | ICD-10-CM

## 2024-05-29 MED ORDER — IOHEXOL 300 MG/ML  SOLN
75.0000 mL | Freq: Once | INTRAMUSCULAR | Status: AC | PRN
  Administered 2024-05-29: 75 mL via INTRAVENOUS

## 2024-06-18 ENCOUNTER — Ambulatory Visit: Payer: Self-pay | Admitting: Physician Assistant

## 2024-06-18 ENCOUNTER — Ambulatory Visit (HOSPITAL_COMMUNITY)
Admission: RE | Admit: 2024-06-18 | Discharge: 2024-06-18 | Disposition: A | Discharge status: Home / Self Care | Source: Ambulatory Visit | Attending: Physician Assistant | Admitting: Physician Assistant

## 2024-06-18 DIAGNOSIS — M8589 Other specified disorders of bone density and structure, multiple sites: Secondary | ICD-10-CM | POA: Insufficient documentation

## 2024-06-18 NOTE — Progress Notes (Signed)
 DEXA consistent with osteopenia.  Please forward results to Dr. Balan as requested by the patient.

## 2024-12-03 ENCOUNTER — Ambulatory Visit: Admitting: Rheumatology
